# Patient Record
Sex: Female | Born: 1970 | Race: White | Hispanic: Yes | Marital: Married | Smoking: Never smoker
Health system: Southern US, Community
[De-identification: ages and names within clinical notes are randomized; demographics above are authoritative.]

## PROBLEM LIST (undated history)

## (undated) DIAGNOSIS — R0600 Dyspnea, unspecified: Secondary | ICD-10-CM

## (undated) DIAGNOSIS — R87629 Unspecified abnormal cytological findings in specimens from vagina: Secondary | ICD-10-CM

## (undated) DIAGNOSIS — R51 Headache: Secondary | ICD-10-CM

## (undated) DIAGNOSIS — Z8619 Personal history of other infectious and parasitic diseases: Secondary | ICD-10-CM

## (undated) DIAGNOSIS — R002 Palpitations: Secondary | ICD-10-CM

## (undated) DIAGNOSIS — T8859XA Other complications of anesthesia, initial encounter: Secondary | ICD-10-CM

## (undated) DIAGNOSIS — R079 Chest pain, unspecified: Secondary | ICD-10-CM

## (undated) DIAGNOSIS — R519 Headache, unspecified: Secondary | ICD-10-CM

## (undated) DIAGNOSIS — Z8669 Personal history of other diseases of the nervous system and sense organs: Secondary | ICD-10-CM

## (undated) DIAGNOSIS — E785 Hyperlipidemia, unspecified: Secondary | ICD-10-CM

## (undated) HISTORY — DX: Headache: R51

## (undated) HISTORY — PX: BREAST ENHANCEMENT SURGERY: SHX7

## (undated) HISTORY — PX: AUGMENTATION MAMMAPLASTY: SUR837

## (undated) HISTORY — DX: Chest pain, unspecified: R07.9

## (undated) HISTORY — DX: Headache, unspecified: R51.9

## (undated) HISTORY — DX: Personal history of other diseases of the nervous system and sense organs: Z86.69

## (undated) HISTORY — DX: Personal history of other infectious and parasitic diseases: Z86.19

## (undated) HISTORY — DX: Hyperlipidemia, unspecified: E78.5

## (undated) HISTORY — DX: Palpitations: R00.2

## (undated) HISTORY — DX: Unspecified abnormal cytological findings in specimens from vagina: R87.629

---
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
## 2004-01-24 HISTORY — PX: AUGMENTATION MAMMAPLASTY: SUR837

## 2007-01-02 ENCOUNTER — Inpatient Hospital Stay (HOSPITAL_COMMUNITY): Admission: AD | Admit: 2007-01-02 | Discharge: 2007-01-02 | Payer: Self-pay | Admitting: Obstetrics and Gynecology

## 2007-07-06 DIAGNOSIS — O1410 Severe pre-eclampsia, unspecified trimester: Secondary | ICD-10-CM

## 2010-10-31 LAB — URINALYSIS, ROUTINE W REFLEX MICROSCOPIC
Bilirubin Urine: NEGATIVE
Glucose, UA: NEGATIVE
Nitrite: NEGATIVE
Specific Gravity, Urine: 1.015
pH: 7

## 2010-10-31 LAB — CBC
HCT: 37.5
MCHC: 34.2
MCV: 82.8
RBC: 4.53
WBC: 8.9

## 2010-10-31 LAB — URINE MICROSCOPIC-ADD ON

## 2010-10-31 LAB — URINE CULTURE

## 2011-06-21 ENCOUNTER — Ambulatory Visit (INDEPENDENT_AMBULATORY_CARE_PROVIDER_SITE_OTHER): Payer: Federal, State, Local not specified - PPO | Admitting: Family Medicine

## 2011-06-21 ENCOUNTER — Encounter: Payer: Self-pay | Admitting: Family Medicine

## 2011-06-21 VITALS — BP 108/60 | HR 84 | Temp 98.1°F | Ht 62.0 in | Wt 121.0 lb

## 2011-06-21 DIAGNOSIS — Z8669 Personal history of other diseases of the nervous system and sense organs: Secondary | ICD-10-CM | POA: Insufficient documentation

## 2011-06-21 DIAGNOSIS — F4320 Adjustment disorder, unspecified: Secondary | ICD-10-CM | POA: Insufficient documentation

## 2011-06-21 DIAGNOSIS — Z8619 Personal history of other infectious and parasitic diseases: Secondary | ICD-10-CM | POA: Insufficient documentation

## 2011-06-21 MED ORDER — ALPRAZOLAM 0.5 MG PO TBDP: 0.5000 mg | ORAL_TABLET | Freq: Two times a day (BID) | ORAL | Status: DC | PRN

## 2011-06-21 MED ORDER — ALPRAZOLAM 0.25 MG PO TBDP: 0.2500 mg | ORAL_TABLET | Freq: Two times a day (BID) | ORAL | Status: DC | PRN

## 2011-06-21 NOTE — Patient Instructions (Signed)
Gusto conocerla hoy.  llamenos con preguntas. Empieze xanax cuando necesite para ansiedad. regrese en 2-3 meses para recheck.

## 2011-06-21 NOTE — Assessment & Plan Note (Addendum)
GAD7 = 7/21 Possible anxiety attacks. Discussed healthy coping strategies for stress as well as anticipated improvement of anxiety and sxs with more stable environment. Will treat temporarily with xanax. Discussed if anxiety becoming more established, will likely recommend healthier long term management strategy (SSRI, CBT). Pt agrees with plan. rtc 2-3 mo for f/u.

## 2011-06-21 NOTE — Assessment & Plan Note (Signed)
sounds like Hep A after contaminated food, told cleared on her own

## 2011-06-21 NOTE — Progress Notes (Signed)
Subjective:    Patient ID: Krista Perez, female    DOB: 02/04/1970, 41 y.o.   MRN: [id number]  HPI CC: new pt establish  H/o migraines and HA, seem to be associated around period.  1-2 days after period.  Typically respond to otc analgesics  Anxiety - feeling increased anxiety recently.  Trouble sleeping recently.  Lived in [place] for 17 yrs where she met husband, then moved to [place] for 6 yrs for husband's job, then moved to [place] for 3 mo, now recently moved to [place].  Lots of stress with moves, fluctuating living environment.  Some anxiety attacks described as worsening anxiety associated with SOB.  Notes more irritable and shorter fuse with this.  Hopeful for more stable living situation planned in near future - no moves planned.  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  H/o some anxiety years ago in college, treated with alprazolam in [place] and [place] in [place].  Xanax did significantly help with anxiety issues.  ambien caused migraines.  Stress relieving techniques - goes to gym 3x/wk.  Does take time out for herself on a regular basis.  Started taking ceramic course in [place].  Used to work, not currently.  Did enjoy working in past, but thinks will start applying for job in a few months, first wants to settle into new environment.  Preventative: Currently on period Well woman 10/2010 - in [place].  Normal pap smear, mammogram. Normal blood work Tetanus - thinks 2011  Caffeine: rare decaf Lives with husband and daughter (2009) and 1 dog Occupation: currently housewife, thinking about going back to work.  Prior lived in [place], husband is [employer] agent Activity: gym 3x/wk Diet: cooks at home.  Good water, fruits/vegetables daily  Medications and allergies reviewed and updated in chart.  Past histories reviewed and updated if relevant as below. Patient Active Problem List  Diagnoses  . History of hepatitis  . Hx of migraines  .  Adjustment disorder   Past Medical History  Diagnosis Date  . Generalized headaches     frequent  . Hx of migraines   . History of hepatitis     sounds like Hep A after contaminated food, cleared   Past Surgical History  Procedure Date  . Appendectomy 1990  . Tonsillectomy 1980  . Breast enhancement surgery    History  Substance Use Topics  . Smoking status: Never Smoker   . Smokeless tobacco: Never Used  . Alcohol Use: Yes     Rare   Family History  Problem Relation Age of Onset  . Cancer Maternal Grandfather 50    lung  . Cancer Maternal Grandmother 70    leukemia  . Coronary artery disease Neg Hx   . Stroke Neg Hx   . Diabetes Neg Hx   . Hypertension Neg Hx    No Known Allergies No current outpatient prescriptions on file prior to visit.     Review of Systems  Constitutional: Negative for fever, chills, activity change, appetite change, fatigue and unexpected weight change.  HENT: Negative for hearing loss and neck pain.   Eyes: Positive for visual disturbance (some deteriorating vision).  Respiratory: Positive for shortness of breath. Negative for cough, chest tightness and wheezing.   Cardiovascular: Negative for chest pain, palpitations and leg swelling.  Gastrointestinal: Negative for nausea, vomiting, abdominal pain, diarrhea, constipation, blood in stool and abdominal distention.  Genitourinary: Negative for hematuria and difficulty urinating.  Musculoskeletal: Negative for myalgias  and arthralgias.  Skin: Negative for rash.  Neurological: Positive for headaches. Negative for dizziness, seizures and syncope.  Hematological: Does not bruise/bleed easily.  Psychiatric/Behavioral: Negative for dysphoric mood. The patient is nervous/anxious.        Objective:   Physical Exam  Nursing note and vitals reviewed. Constitutional: She is oriented to person, place, and time. She appears well-developed and well-nourished. No distress.  HENT:  Head:  Normocephalic and atraumatic.  Right Ear: Hearing, tympanic membrane, external ear and ear canal normal.  Left Ear: Hearing, tympanic membrane, external ear and ear canal normal.  Nose: Nose normal. No mucosal edema or rhinorrhea.  Mouth/Throat: Uvula is midline, oropharynx is clear and moist and mucous membranes are normal. No oropharyngeal exudate, posterior oropharyngeal edema, posterior oropharyngeal erythema or tonsillar abscesses.  Eyes: Conjunctivae and EOM are normal. Pupils are equal, round, and reactive to light. No scleral icterus.  Neck: Normal range of motion. Neck supple. No thyromegaly present.  Cardiovascular: Normal rate, regular rhythm, normal heart sounds and intact distal pulses.   No murmur heard. Pulses:      Radial pulses are 2+ on the right side, and 2+ on the left side.  Pulmonary/Chest: Effort normal and breath sounds normal. No respiratory distress. She has no wheezes. She has no rales.  Abdominal: Soft. Bowel sounds are normal. She exhibits no distension and no mass. There is no tenderness. There is no rebound and no guarding.  Musculoskeletal: Normal range of motion. She exhibits no edema.  Lymphadenopathy:    She has no cervical adenopathy.  Neurological: She is alert and oriented to person, place, and time.       CN grossly intact, station and gait intact  Skin: Skin is warm and dry. No rash noted.  Psychiatric: She has a normal mood and affect. Her behavior is normal. Judgment and thought content normal.       Assessment & Plan:

## 2011-07-19 ENCOUNTER — Other Ambulatory Visit: Payer: Self-pay

## 2011-07-19 MED ORDER — ALPRAZOLAM 0.5 MG PO TBDP: 0.5000 mg | ORAL_TABLET | Freq: Two times a day (BID) | ORAL | Status: AC | PRN

## 2011-07-19 NOTE — Telephone Encounter (Signed)
After seen 06/21/11 took Alprazolam 0.25 mg one tab twice a day prn for 1 week; did not help anxiety. After first week pt taking two tabs @ hs and if needed for anxiousness 2 tabs in AM. This helped anxiety. Out of [facility]; Please advise.

## 2011-07-19 NOTE — Telephone Encounter (Signed)
May phone in higher dose then notify pt.

## 2011-07-20 NOTE — Telephone Encounter (Signed)
Called in Rx as directed and called patient to let her know it was called to the pharmacy.

## 2011-08-01 ENCOUNTER — Ambulatory Visit: Payer: Self-pay | Admitting: Internal Medicine

## 2011-09-11 ENCOUNTER — Telehealth: Payer: Self-pay

## 2011-09-11 ENCOUNTER — Ambulatory Visit (INDEPENDENT_AMBULATORY_CARE_PROVIDER_SITE_OTHER): Payer: Federal, State, Local not specified - PPO | Admitting: Family Medicine

## 2011-09-11 ENCOUNTER — Encounter: Payer: Self-pay | Admitting: Family Medicine

## 2011-09-11 VITALS — BP 104/68 | HR 70 | Temp 98.3°F | Ht 62.0 in | Wt 117.0 lb

## 2011-09-11 DIAGNOSIS — R51 Headache: Secondary | ICD-10-CM

## 2011-09-11 DIAGNOSIS — R519 Headache, unspecified: Secondary | ICD-10-CM | POA: Insufficient documentation

## 2011-09-11 MED ORDER — CYCLOBENZAPRINE HCL 10 MG PO TABS: 10.0000 mg | ORAL_TABLET | Freq: Two times a day (BID) | ORAL | Status: DC | PRN

## 2011-09-11 MED ORDER — CYCLOBENZAPRINE HCL 10 MG PO TABS: 10.0000 mg | ORAL_TABLET | Freq: Three times a day (TID) | ORAL | Status: DC | PRN

## 2011-09-11 NOTE — Telephone Encounter (Signed)
Casie with [facility] wanted to verify cyclobenzaprine instructions take one tab by mouth two times daily as needed for h/a.Verified by chart note.

## 2011-09-11 NOTE — Assessment & Plan Note (Signed)
Of 3 d duration, in h/o migraines. Anticipate tension type headache, or possibly migraine without aura. Doubt occipital neuralgia. Nonfocal neurological exam. Discussed different headache types, wil treat with flexeril PRN, discussed sedation precautions. Also suggested massage/heat. If not improved, consider further evaluation.
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
## 2011-09-11 NOTE — Patient Instructions (Signed)
Creo que tiene dolor de cabeza muscular (tension type headache). Use heating pad, flexeril (relajante de musculo), y considere masaje. Gusto verla hoy.  dejenos saber si no esta mejorando con esto. llame a clinica con preguntas.

## 2011-09-21 ENCOUNTER — Ambulatory Visit: Payer: Federal, State, Local not specified - PPO | Admitting: Family Medicine

## 2011-11-28 ENCOUNTER — Telehealth: Payer: Self-pay | Admitting: Family Medicine

## 2011-11-28 NOTE — Telephone Encounter (Signed)
Patient notified and appt scheduled for tomorrow. She will call and cancel if she decides she doesn't need appt in the morning.

## 2011-11-28 NOTE — Telephone Encounter (Signed)
Noted. I could see her today at 3:15pm or schedule appt tomorrow.

## 2011-11-28 NOTE — Telephone Encounter (Signed)
Caller: [name]/Patient; Patient Name: Krista Perez; PCP: Eustaquio Boyden [facility]); Best Callback Phone Number: [phone] Calling regarding sore or a blister like lesion to her palate, noticed 11/25/11  its the size of a pinky finger print. States this hurts. Was flossing and hit top of her mouth. Hurts worse when she eats. Took Tylenol 11/27/11 and pain was relieved and was able to sleep well. Emergent signs and symptoms ruled out as per Mouth Lesions protocol except for see in 24 hours due to painful white spot on the lining of the mouth, will call back for appt.

## 2011-11-29 ENCOUNTER — Ambulatory Visit: Payer: Federal, State, Local not specified - PPO | Admitting: Family Medicine

## 2012-01-29 ENCOUNTER — Other Ambulatory Visit: Payer: Self-pay

## 2012-01-29 MED ORDER — ALPRAZOLAM 0.5 MG PO TABS: 0.5000 mg | ORAL_TABLET | Freq: Every evening | ORAL | Status: DC | PRN

## 2012-01-29 NOTE — Telephone Encounter (Addendum)
Pt left v/m requesting refill alprazolam to [facility]. Pt said having trouble relaxing so can go to sleep and Pt is going out of town on international flight in 2 weeks and that makes pt anxious when flies.pt request call back when rx called in.Please advise.

## 2012-01-29 NOTE — Telephone Encounter (Signed)
plz phone in. 

## 2012-01-30 NOTE — Telephone Encounter (Signed)
Rx called in as directed.   

## 2012-03-14 ENCOUNTER — Telehealth: Payer: Self-pay

## 2012-03-14 DIAGNOSIS — IMO0001 Reserved for inherently not codable concepts without codable children: Secondary | ICD-10-CM

## 2012-03-14 NOTE — Telephone Encounter (Signed)
Pt left v/m she is applying for new employment and needs lab test that pt is immune to MMR, varicella,hep B. Pt also needs to schedule influenza injection and TB test.Please advise.

## 2012-03-15 ENCOUNTER — Other Ambulatory Visit (INDEPENDENT_AMBULATORY_CARE_PROVIDER_SITE_OTHER): Payer: Federal, State, Local not specified - PPO

## 2012-03-15 ENCOUNTER — Telehealth: Payer: Self-pay | Admitting: Family Medicine

## 2012-03-15 DIAGNOSIS — Z0184 Encounter for antibody response examination: Secondary | ICD-10-CM

## 2012-03-15 DIAGNOSIS — IMO0001 Reserved for inherently not codable concepts without codable children: Secondary | ICD-10-CM

## 2012-03-15 NOTE — Telephone Encounter (Signed)
Message left advising patient to call and schedule nurse visit for Flu vaccine and PPD and lab appt for titers. Will await return call. Please see below and place orders for titers. Thanks!

## 2012-03-15 NOTE — Telephone Encounter (Signed)
See other phone note dated same.

## 2012-03-15 NOTE — Telephone Encounter (Signed)
placed orders in chart.  Pt will need PPD as well. Would also provide with Tdap unless certain she received 2011.

## 2012-03-15 NOTE — Telephone Encounter (Signed)
Pt needs PPD skin test for her employment. Is it ok to sch this? Thank you.

## 2012-03-18 LAB — RUBELLA SCREEN: Rubella: 24.7 Index — ABNORMAL HIGH (ref <0.90)

## 2012-03-18 LAB — MUMPS ANTIBODY, IGG: Mumps IgG: >300 AU/mL — ABNORMAL HIGH (ref <9.00)

## 2012-03-18 LAB — VARICELLA ZOSTER ANTIBODY, IGG: Varicella IgG: 3035 Index — ABNORMAL HIGH (ref <135.00)

## 2012-03-19 ENCOUNTER — Ambulatory Visit (INDEPENDENT_AMBULATORY_CARE_PROVIDER_SITE_OTHER): Payer: Federal, State, Local not specified - PPO | Admitting: *Deleted

## 2012-03-19 DIAGNOSIS — Z0279 Encounter for issue of other medical certificate: Secondary | ICD-10-CM

## 2012-03-19 DIAGNOSIS — Z23 Encounter for immunization: Secondary | ICD-10-CM

## 2012-03-21 LAB — TB SKIN TEST
Induration: 0 mm
TB Skin Test: NEGATIVE

## 2012-03-27 ENCOUNTER — Telehealth: Payer: Self-pay | Admitting: *Deleted

## 2012-03-27 NOTE — Telephone Encounter (Signed)
Please clarify with patient what the problem is - she's immune to MMR, and varicella.  She received 1st Hep B shot.  Should be fine for school.

## 2012-03-27 NOTE — Telephone Encounter (Signed)
Patient calling stating that she got her results for the MMR titer and she turned it in to her school but they are saying she's only immune to only one, not all of the MMR. Please advise

## 2012-03-28 NOTE — Telephone Encounter (Signed)
We drew for Mumps, Rubella, and Rubeola. We did not draw for Measles. She needs to show immunity for that as well.

## 2012-03-28 NOTE — Telephone Encounter (Signed)
Rubeola = measles.

## 2012-03-28 NOTE — Telephone Encounter (Signed)
Message left advising patient. Instructed to call with any questions or if further problems.

## 2012-04-01 ENCOUNTER — Encounter: Payer: Self-pay | Admitting: *Deleted

## 2012-04-03 NOTE — Telephone Encounter (Signed)
This has been taken of.

## 2012-08-20 ENCOUNTER — Other Ambulatory Visit (INDEPENDENT_AMBULATORY_CARE_PROVIDER_SITE_OTHER): Payer: Federal, State, Local not specified - PPO

## 2012-08-20 DIAGNOSIS — Z0000 Encounter for general adult medical examination without abnormal findings: Secondary | ICD-10-CM

## 2012-08-20 DIAGNOSIS — Z13220 Encounter for screening for lipoid disorders: Secondary | ICD-10-CM

## 2012-08-20 DIAGNOSIS — Z131 Encounter for screening for diabetes mellitus: Secondary | ICD-10-CM

## 2012-08-20 LAB — COMPREHENSIVE METABOLIC PANEL
AST: 14 U/L (ref 0–37)
Albumin: 3.9 g/dL (ref 3.5–5.2)
BUN: 10 mg/dL (ref 6–23)
CO2: 25 mEq/L (ref 19–32)
Calcium: 9 mg/dL (ref 8.4–10.5)
Chloride: 104 mEq/L (ref 96–112)
GFR: 136.58 mL/min (ref >60.00)
Glucose, Bld: 97 mg/dL (ref 70–99)
Potassium: 4 mEq/L (ref 3.5–5.1)

## 2012-08-20 LAB — LDL CHOLESTEROL, DIRECT: Direct LDL: 167.6 mg/dL

## 2012-08-20 LAB — CBC WITH DIFFERENTIAL/PLATELET
Eosinophils Relative: 2.5 % (ref 0.0–5.0)
Lymphocytes Relative: 25.6 % (ref 12.0–46.0)
MCV: 85.6 fl (ref 78.0–100.0)
Monocytes Absolute: 0.3 10*3/uL (ref 0.1–1.0)
Neutrophils Relative %: 66.3 % (ref 43.0–77.0)
Platelets: 243 10*3/uL (ref 150.0–400.0)
WBC: 5.1 10*3/uL (ref 4.5–10.5)

## 2012-08-20 LAB — TSH: TSH: 2 u[IU]/mL (ref 0.35–5.50)

## 2012-08-30 ENCOUNTER — Encounter: Payer: Self-pay | Admitting: Radiology

## 2012-09-02 ENCOUNTER — Encounter: Payer: Federal, State, Local not specified - PPO | Admitting: Family Medicine

## 2012-09-12 ENCOUNTER — Encounter: Payer: Federal, State, Local not specified - PPO | Admitting: Family Medicine

## 2012-10-01 ENCOUNTER — Encounter: Payer: Federal, State, Local not specified - PPO | Admitting: Family Medicine

## 2012-10-07 ENCOUNTER — Encounter: Payer: Self-pay | Admitting: Family Medicine

## 2012-10-07 ENCOUNTER — Ambulatory Visit (INDEPENDENT_AMBULATORY_CARE_PROVIDER_SITE_OTHER): Payer: Federal, State, Local not specified - PPO | Admitting: Family Medicine

## 2012-10-07 VITALS — BP 118/68 | HR 76 | Temp 98.2°F | Ht 62.0 in | Wt 120.5 lb

## 2012-10-07 DIAGNOSIS — Z0000 Encounter for general adult medical examination without abnormal findings: Secondary | ICD-10-CM

## 2012-10-07 DIAGNOSIS — Z23 Encounter for immunization: Secondary | ICD-10-CM

## 2012-10-07 DIAGNOSIS — Z1231 Encounter for screening mammogram for malignant neoplasm of breast: Secondary | ICD-10-CM

## 2012-10-07 DIAGNOSIS — E785 Hyperlipidemia, unspecified: Secondary | ICD-10-CM | POA: Insufficient documentation

## 2012-10-07 DIAGNOSIS — F4320 Adjustment disorder, unspecified: Secondary | ICD-10-CM

## 2012-10-07 DIAGNOSIS — Z01419 Encounter for gynecological examination (general) (routine) without abnormal findings: Secondary | ICD-10-CM

## 2012-10-07 MED ORDER — ALPRAZOLAM 0.5 MG PO TABS: 0.5000 mg | ORAL_TABLET | Freq: Every evening | ORAL | Status: DC | PRN

## 2012-10-07 NOTE — Addendum Note (Signed)
Addended by: Josph Macho A on: 10/07/2012 09:54 AM   Modules accepted: Orders

## 2012-10-07 NOTE — Assessment & Plan Note (Signed)
Mild. Reviewed healthy and low chol diet with patient.

## 2012-10-07 NOTE — Assessment & Plan Note (Signed)
Preventative protocols reviewed and updated unless pt declined. Discussed healthy diet and lifestyle. 2nd Hep B today, flu shot today. Will refer to OBGYN to establish for well woman exam.

## 2012-10-07 NOTE — Patient Instructions (Signed)
Second hepatitis B shot today. Flu shot today. Consider ibuprofen or midrin for headaches associated with periods. Pass by Marion's office for referral to GYN for well woman exam and for referral for mammogram. Good to see you today, call us with questions.

## 2012-10-07 NOTE — Assessment & Plan Note (Signed)
Minimal as she's grown accustomed to [place].  Minimal use of xanax - refilled today per patient preference.  Last filled 01/2012.

## 2012-10-07 NOTE — Progress Notes (Signed)
Subjective:    Patient ID: Krista Perez, female    DOB: [birth year], 42 y.o.   MRN: [id number]  HPI CC: CPE  R sided HA - on and of.  Tend to be related to period - 4-5 days prior.  Ibuprofen 400mg  helps. Periods becoming more irregular. LMP - 2 d ago. Mood issues - xanax intermittently helps. Finds irritability with 72 yo daughter and husband. Exercising 2-3 times a week at gym. Some difficulty with getting used to [place] - and being away from parents.  Working as Paediatric nurse.  Preventative: Well woman 10/2010 - in [place]. Normal pap smear, mammogram. No h/o abnormal paps. Breast cancer screening - would like mammogram done at breast center.  Would like yearly screening. Would like to establish with local OBGYN for well woman exam. Tetanus - thinks 2011  Flu shot today. 2nd Hep B today.  Seat belt use discussed. No sunburns in last year.  Sunscreen use discussed.  Caffeine: rare decaf  Lives with husband and daughter (2009) and 1 dog  Occupation: currently housewife, thinking about going back to work. Prior lived in [place], husband is [employer] agent  Activity: gym 3x/wk  Diet: cooks at home. Good water, fruits/vegetables daily  Medications and allergies reviewed and updated in chart.  Past histories reviewed and updated if relevant as below. Patient Active Problem List   Diagnosis Date Noted  . Right-sided headache 09/11/2011  . Adjustment disorder 06/21/2011  . History of hepatitis   . Hx of migraines    Past Medical History  Diagnosis Date  . Generalized headaches     frequent  . Hx of migraines   . History of hepatitis     sounds like Hep A after contaminated food, cleared   Past Surgical History  Procedure Laterality Date  . Appendectomy  1990  . Tonsillectomy  1980  . Breast enhancement surgery     History  Substance Use Topics  . Smoking status: Never Smoker   . Smokeless tobacco: Never Used  . Alcohol Use: Yes     Comment: Rare    Family History  Problem Relation Age of Onset  . Cancer Maternal Grandfather 50    lung  . Cancer Maternal Grandmother 70    leukemia  . Coronary artery disease Neg Hx   . Stroke Neg Hx   . Diabetes Neg Hx   . Hypertension Neg Hx    No Known Allergies Current Outpatient Prescriptions on File Prior to Visit  Medication Sig Dispense Refill  . ALPRAZolam (XANAX) 0.5 MG tablet Take 1 tablet (0.5 mg total) by mouth at bedtime as needed for sleep or anxiety.  30 tablet  0   No current facility-administered medications on file prior to visit.     Review of Systems  Constitutional: Negative for fever, chills, activity change, appetite change, fatigue and unexpected weight change.  HENT: Negative for hearing loss and neck pain.   Eyes: Negative for visual disturbance.  Respiratory: Negative for cough, chest tightness, shortness of breath and wheezing.   Cardiovascular: Negative for chest pain, palpitations and leg swelling.  Gastrointestinal: Negative for nausea, vomiting, abdominal pain, diarrhea, constipation, blood in stool and abdominal distention.  Genitourinary: Negative for hematuria and difficulty urinating.  Musculoskeletal: Negative for myalgias and arthralgias.  Skin: Negative for rash.  Neurological: Positive for headaches. Negative for dizziness, seizures and syncope.  Hematological: Negative for adenopathy. Does not bruise/bleed easily.  Psychiatric/Behavioral: Negative for dysphoric mood. The patient is  not nervous/anxious.        Objective:   Physical Exam  Nursing note and vitals reviewed. Constitutional: She is oriented to person, place, and time. She appears well-developed and well-nourished. No distress.  HENT:  Head: Normocephalic and atraumatic.  Right Ear: Hearing, tympanic membrane, external ear and ear canal normal.  Left Ear: Hearing, tympanic membrane, external ear and ear canal normal.  Nose: Nose normal.  Mouth/Throat: Oropharynx is clear and moist.  No oropharyngeal exudate.  Eyes: Conjunctivae and EOM are normal. Pupils are equal, round, and reactive to light. No scleral icterus.  Neck: Normal range of motion. Neck supple. No thyromegaly present.  Cardiovascular: Normal rate, regular rhythm, normal heart sounds and intact distal pulses.   No murmur heard. Pulses:      Radial pulses are 2+ on the right side, and 2+ on the left side.  Pulmonary/Chest: Effort normal and breath sounds normal. No respiratory distress. She has no wheezes. She has no rales.  Abdominal: Soft. Bowel sounds are normal. She exhibits no distension and no mass. There is no tenderness. There is no rebound and no guarding.  Musculoskeletal: Normal range of motion. She exhibits no edema.  Lymphadenopathy:    She has no cervical adenopathy.  Neurological: She is alert and oriented to person, place, and time.  CN grossly intact, station and gait intact  Skin: Skin is warm and dry. No rash noted.  Psychiatric: She has a normal mood and affect. Her behavior is normal. Judgment and thought content normal.       Assessment & Plan:

## 2012-10-17 ENCOUNTER — Telehealth: Payer: Self-pay

## 2012-10-17 NOTE — Telephone Encounter (Signed)
Pt request copy of immunization report and labs on 03/15/12 for titers and antibodies. Release of information process done. Release # [id number].pt notified copies at front desk ready for pick up.

## 2012-10-29 ENCOUNTER — Encounter: Payer: Federal, State, Local not specified - PPO | Admitting: Obstetrics & Gynecology

## 2012-10-30 ENCOUNTER — Ambulatory Visit
Admission: RE | Admit: 2012-10-30 | Discharge: 2012-10-30 | Disposition: A | Discharge status: Home / Self Care | Payer: Federal, State, Local not specified - PPO | Source: Ambulatory Visit | Attending: Family Medicine | Admitting: Family Medicine

## 2012-10-30 DIAGNOSIS — Z1231 Encounter for screening mammogram for malignant neoplasm of breast: Secondary | ICD-10-CM

## 2012-11-05 ENCOUNTER — Ambulatory Visit (INDEPENDENT_AMBULATORY_CARE_PROVIDER_SITE_OTHER): Payer: Federal, State, Local not specified - PPO | Admitting: Obstetrics & Gynecology

## 2012-11-05 ENCOUNTER — Encounter: Payer: Self-pay | Admitting: Obstetrics & Gynecology

## 2012-11-05 VITALS — BP 123/82 | HR 78 | Ht 62.0 in | Wt 122.0 lb

## 2012-11-05 DIAGNOSIS — R928 Other abnormal and inconclusive findings on diagnostic imaging of breast: Secondary | ICD-10-CM

## 2012-11-05 DIAGNOSIS — Z1151 Encounter for screening for human papillomavirus (HPV): Secondary | ICD-10-CM

## 2012-11-05 DIAGNOSIS — Z124 Encounter for screening for malignant neoplasm of cervix: Secondary | ICD-10-CM

## 2012-11-05 DIAGNOSIS — Z01419 Encounter for gynecological examination (general) (routine) without abnormal findings: Secondary | ICD-10-CM

## 2012-11-05 NOTE — Progress Notes (Signed)
  Subjective:     Krista Perez is a 42 y.o. female and is here for a comprehensive physical exam. The patient reports no problems.  History   Social History  . Marital Status: Married    Spouse Name: N/A    Number of Children: N/A  . Years of Education: N/A   Occupational History  . Not on file.   Social History Main Topics  . Smoking status: Never Smoker   . Smokeless tobacco: Never Used  . Alcohol Use: Yes     Comment: Rare  . Drug Use: No  . Sexual Activity: Yes    Partners: Male   Other Topics Concern  . Not on file   Social History Narrative   Caffeine: rare decaf   Lives with husband and daughter (2009) and 1 dog   Occupation: currently housewife, thinking about going back to work.  Prior lived in [place], husband is [employer] agent   Activity: gym 3x/wk   Diet: cooks at home.  Good water, fruits/vegetables daily   Health Maintenance  Topic Date Due  . Pap Smear  04/21/1987  . Influenza Vaccine  08/23/2013  . Tetanus/tdap  01/24/2019    The following portions of the patient's history were reviewed and updated as appropriate: allergies, current medications, past family history, past medical history, past social history, past surgical history and problem list.  10/30/12 screening mammogram showed possible left breast mass, right breast assymetry. Breast Center will arrange for further imaging and management.  This was discussed with the patient.  Review of Systems A comprehensive review of systems was negative.   Objective:  BP 123/82  Pulse 78  Ht 5\' 2"  (1.575 m)  Wt 122 lb (55.339 kg)  BMI 22.31 kg/m2  LMP 10/05/2012 GENERAL: Well-developed, well-nourished female in no acute distress.  HEENT: Normocephalic, atraumatic. Sclerae anicteric.  NECK: Supple. Normal thyroid.  LUNGS: Clear to auscultation bilaterally.  HEART: Regular rate and rhythm. BREASTS: Symmetric in size, implants in place. No palpable abnormal masses, skin changes, nipple drainage, or  lymphadenopathy. ABDOMEN: Soft, nontender, nondistended. No organomegaly. PELVIC: Normal external female genitalia. Vagina is pink and rugated.  Normal discharge. Normal cervix contour. Pap smear obtained. Uterus is normal in size. No adnexal mass or tenderness.  EXTREMITIES: No cyanosis, clubbing, or edema, 2+ distal pulses.   Assessment:    Healthy female exam. Abnormal mammogram   Plan:   Pap done, will follow up results and manage accordingly. Patient to follow up with Breast Center regarding further evaluation of abnormal screening mammogram Routine preventative health maintenance measures emphasized

## 2012-11-05 NOTE — Patient Instructions (Signed)
Preventive Care for Adults, Female A healthy lifestyle and preventive care can promote health and wellness. Preventive health guidelines for women include the following key practices.  A routine yearly physical is a good way to check with your caregiver about your health and preventive screening. It is a chance to share any concerns and updates on your health, and to receive a thorough exam.  Visit your dentist for a routine exam and preventive care every 6 months. Brush your teeth twice a day and floss once a day. Good oral hygiene prevents tooth decay and gum disease.  The frequency of eye exams is based on your age, health, family medical history, use of contact lenses, and other factors. Follow your caregiver's recommendations for frequency of eye exams.  Eat a healthy diet. Foods like vegetables, fruits, whole grains, low-fat dairy products, and lean protein foods contain the nutrients you need without too many calories. Decrease your intake of foods high in solid fats, added sugars, and salt. Eat the right amount of calories for you.Get information about a proper diet from your caregiver, if necessary.  Regular physical exercise is one of the most important things you can do for your health. Most adults should get at least 150 minutes of moderate-intensity exercise (any activity that increases your heart rate and causes you to sweat) each week. In addition, most adults need muscle-strengthening exercises on 2 or more days a week.  Maintain a healthy weight. The body mass index (BMI) is a screening tool to identify possible weight problems. It provides an estimate of body fat based on height and weight. Your caregiver can help determine your BMI, and can help you achieve or maintain a healthy weight.For adults 20 years and older:  A BMI below 18.5 is considered underweight.  A BMI of 18.5 to 24.9 is normal.  A BMI of 25 to 29.9 is considered overweight.  A BMI of 30 and above is  considered obese.  Maintain normal blood lipids and cholesterol levels by exercising and minimizing your intake of saturated fat. Eat a balanced diet with plenty of fruit and vegetables. Blood tests for lipids and cholesterol should begin at age 20 and be repeated every 5 years. If your lipid or cholesterol levels are high, you are over 50, or you are at high risk for heart disease, you may need your cholesterol levels checked more frequently.Ongoing high lipid and cholesterol levels should be treated with medicines if diet and exercise are not effective.  If you smoke, find out from your caregiver how to quit. If you do not use tobacco, do not start.  If you are pregnant, do not drink alcohol. If you are breastfeeding, be very cautious about drinking alcohol. If you are not pregnant and choose to drink alcohol, do not exceed 1 drink per day. One drink is considered to be 12 ounces (355 mL) of beer, 5 ounces (148 mL) of wine, or 1.5 ounces (44 mL) of liquor.  Avoid use of street drugs. Do not share needles with anyone. Ask for help if you need support or instructions about stopping the use of drugs.  High blood pressure causes heart disease and increases the risk of stroke. Your blood pressure should be checked at least every 1 to 2 years. Ongoing high blood pressure should be treated with medicines if weight loss and exercise are not effective.  If you are 55 to 43 years old, ask your caregiver if you should take aspirin to prevent strokes.  Diabetes   screening involves taking a blood sample to check your fasting blood sugar level. This should be done once every 3 years, after age 45, if you are within normal weight and without risk factors for diabetes. Testing should be considered at a younger age or be carried out more frequently if you are overweight and have at least 1 risk factor for diabetes.  Breast cancer screening is essential preventive care for women. You should practice "breast  self-awareness." This means understanding the normal appearance and feel of your breasts and may include breast self-examination. Any changes detected, no matter how small, should be reported to a caregiver. Women in their 20s and 30s should have a clinical breast exam (CBE) by a caregiver as part of a regular health exam every 1 to 3 years. After age 40, women should have a CBE every year. Starting at age 40, women should consider having a mammography (breast X-ray test) every year. Women who have a family history of breast cancer should talk to their caregiver about genetic screening. Women at a high risk of breast cancer should talk to their caregivers about having magnetic resonance imaging (MRI) and a mammography every year.  The Pap test is a screening test for cervical cancer. A Pap test can show cell changes on the cervix that might become cervical cancer if left untreated. A Pap test is a procedure in which cells are obtained and examined from the lower end of the uterus (cervix).  Women should have a Pap test starting at age 21.  Between ages 21 and 29, Pap tests should be repeated every 2 years.  Beginning at age 30, you should have a Pap test every 3 years as long as the past 3 Pap tests have been normal.  Some women have medical problems that increase the chance of getting cervical cancer. Talk to your caregiver about these problems. It is especially important to talk to your caregiver if a new problem develops soon after your last Pap test. In these cases, your caregiver may recommend more frequent screening and Pap tests.  The above recommendations are the same for women who have or have not gotten the vaccine for human papillomavirus (HPV).  If you had a hysterectomy for a problem that was not cancer or a condition that could lead to cancer, then you no longer need Pap tests. Even if you no longer need a Pap test, a regular exam is a good idea to make sure no other problems are  starting.  If you are between ages 65 and 70, and you have had normal Pap tests going back 10 years, you no longer need Pap tests. Even if you no longer need a Pap test, a regular exam is a good idea to make sure no other problems are starting.  If you have had past treatment for cervical cancer or a condition that could lead to cancer, you need Pap tests and screening for cancer for at least 20 years after your treatment.  If Pap tests have been discontinued, risk factors (such as a new sexual partner) need to be reassessed to determine if screening should be resumed.  The HPV test is an additional test that may be used for cervical cancer screening. The HPV test looks for the virus that can cause the cell changes on the cervix. The cells collected during the Pap test can be tested for HPV. The HPV test could be used to screen women aged 30 years and older, and should   be used in women of any age who have unclear Pap test results. After the age of 30, women should have HPV testing at the same frequency as a Pap test.  Colorectal cancer can be detected and often prevented. Most routine colorectal cancer screening begins at the age of 50 and continues through age 75. However, your caregiver may recommend screening at an earlier age if you have risk factors for colon cancer. On a yearly basis, your caregiver may provide home test kits to check for hidden blood in the stool. Use of a small camera at the end of a tube, to directly examine the colon (sigmoidoscopy or colonoscopy), can detect the earliest forms of colorectal cancer. Talk to your caregiver about this at age 50, when routine screening begins. Direct examination of the colon should be repeated every 5 to 10 years through age 75, unless early forms of pre-cancerous polyps or small growths are found.  Hepatitis C blood testing is recommended for all people born from 1945 through 1965 and any individual with known risks for hepatitis C.  Practice  safe sex. Use condoms and avoid high-risk sexual practices to reduce the spread of sexually transmitted infections (STIs). STIs include gonorrhea, chlamydia, syphilis, trichomonas, herpes, HPV, and human immunodeficiency virus (HIV). Herpes, HIV, and HPV are viral illnesses that have no cure. They can result in disability, cancer, and death. Sexually active women aged 25 and younger should be checked for chlamydia. Older women with new or multiple partners should also be tested for chlamydia. Testing for other STIs is recommended if you are sexually active and at increased risk.  Osteoporosis is a disease in which the bones lose minerals and strength with aging. This can result in serious bone fractures. The risk of osteoporosis can be identified using a bone density scan. Women ages 65 and over and women at risk for fractures or osteoporosis should discuss screening with their caregivers. Ask your caregiver whether you should take a calcium supplement or vitamin D to reduce the rate of osteoporosis.  Menopause can be associated with physical symptoms and risks. Hormone replacement therapy is available to decrease symptoms and risks. You should talk to your caregiver about whether hormone replacement therapy is right for you.  Use sunscreen with sun protection factor (SPF) of 30 or more. Apply sunscreen liberally and repeatedly throughout the day. You should seek shade when your shadow is shorter than you. Protect yourself by wearing long sleeves, pants, a wide-brimmed hat, and sunglasses year round, whenever you are outdoors.  Once a month, do a whole body skin exam, using a mirror to look at the skin on your back. Notify your caregiver of new moles, moles that have irregular borders, moles that are larger than a pencil eraser, or moles that have changed in shape or color.  Stay current with required immunizations.  Influenza. You need a dose every fall (or winter). The composition of the flu vaccine  changes each year, so being vaccinated once is not enough.  Pneumococcal polysaccharide. You need 1 to 2 doses if you smoke cigarettes or if you have certain chronic medical conditions. You need 1 dose at age 65 (or older) if you have never been vaccinated.  Tetanus, diphtheria, pertussis (Tdap, Td). Get 1 dose of Tdap vaccine if you are younger than age 65, are over 65 and have contact with an infant, are a healthcare worker, are pregnant, or simply want to be protected from whooping cough. After that, you need a Td   booster dose every 10 years. Consult your caregiver if you have not had at least 3 tetanus and diphtheria-containing shots sometime in your life or have a deep or dirty wound.  HPV. You need this vaccine if you are a woman age 26 or younger. The vaccine is given in 3 doses over 6 months.  Measles, mumps, rubella (MMR). You need at least 1 dose of MMR if you were born in 1957 or later. You may also need a second dose.  Meningococcal. If you are age 19 to 21 and a first-year college student living in a residence hall, or have one of several medical conditions, you need to get vaccinated against meningococcal disease. You may also need additional booster doses.  Zoster (shingles). If you are age 60 or older, you should get this vaccine.  Varicella (chickenpox). If you have never had chickenpox or you were vaccinated but received only 1 dose, talk to your caregiver to find out if you need this vaccine.  Hepatitis A. You need this vaccine if you have a specific risk factor for hepatitis A virus infection or you simply wish to be protected from this disease. The vaccine is usually given as 2 doses, 6 to 18 months apart.  Hepatitis B. You need this vaccine if you have a specific risk factor for hepatitis B virus infection or you simply wish to be protected from this disease. The vaccine is given in 3 doses, usually over 6 months. Preventive Services / Frequency Ages 19 to 39  Blood  pressure check.** / Every 1 to 2 years.  Lipid and cholesterol check.** / Every 5 years beginning at age 20.  Clinical breast exam.** / Every 3 years for women in their 20s and 30s.  Pap test.** / Every 2 years from ages 21 through 29. Every 3 years starting at age 30 through age 65 or 70 with a history of 3 consecutive normal Pap tests.  HPV screening.** / Every 3 years from ages 30 through ages 65 to 70 with a history of 3 consecutive normal Pap tests.  Hepatitis C blood test.** / For any individual with known risks for hepatitis C.  Skin self-exam. / Monthly.  Influenza immunization.** / Every year.  Pneumococcal polysaccharide immunization.** / 1 to 2 doses if you smoke cigarettes or if you have certain chronic medical conditions.  Tetanus, diphtheria, pertussis (Tdap, Td) immunization. / A one-time dose of Tdap vaccine. After that, you need a Td booster dose every 10 years.  HPV immunization. / 3 doses over 6 months, if you are 26 and younger.  Measles, mumps, rubella (MMR) immunization. / You need at least 1 dose of MMR if you were born in 1957 or later. You may also need a second dose.  Meningococcal immunization. / 1 dose if you are age 19 to 21 and a first-year college student living in a residence hall, or have one of several medical conditions, you need to get vaccinated against meningococcal disease. You may also need additional booster doses.  Varicella immunization.** / Consult your caregiver.  Hepatitis A immunization.** / Consult your caregiver. 2 doses, 6 to 18 months apart.  Hepatitis B immunization.** / Consult your caregiver. 3 doses usually over 6 months. Ages 40 to 64  Blood pressure check.** / Every 1 to 2 years.  Lipid and cholesterol check.** / Every 5 years beginning at age 20.  Clinical breast exam.** / Every year after age 40.  Mammogram.** / Every year beginning at age 40   and continuing for as long as you are in good health. Consult with your  caregiver.  Pap test.** / Every 3 years starting at age 30 through age 65 or 70 with a history of 3 consecutive normal Pap tests.  HPV screening.** / Every 3 years from ages 30 through ages 65 to 70 with a history of 3 consecutive normal Pap tests.  Fecal occult blood test (FOBT) of stool. / Every year beginning at age 50 and continuing until age 75. You may not need to do this test if you get a colonoscopy every 10 years.  Flexible sigmoidoscopy or colonoscopy.** / Every 5 years for a flexible sigmoidoscopy or every 10 years for a colonoscopy beginning at age 50 and continuing until age 75.  Hepatitis C blood test.** / For all people born from 1945 through 1965 and any individual with known risks for hepatitis C.  Skin self-exam. / Monthly.  Influenza immunization.** / Every year.  Pneumococcal polysaccharide immunization.** / 1 to 2 doses if you smoke cigarettes or if you have certain chronic medical conditions.  Tetanus, diphtheria, pertussis (Tdap, Td) immunization.** / A one-time dose of Tdap vaccine. After that, you need a Td booster dose every 10 years.  Measles, mumps, rubella (MMR) immunization. / You need at least 1 dose of MMR if you were born in 1957 or later. You may also need a second dose.  Varicella immunization.** / Consult your caregiver.  Meningococcal immunization.** / Consult your caregiver.  Hepatitis A immunization.** / Consult your caregiver. 2 doses, 6 to 18 months apart.  Hepatitis B immunization.** / Consult your caregiver. 3 doses, usually over 6 months. Ages 65 and over  Blood pressure check.** / Every 1 to 2 years.  Lipid and cholesterol check.** / Every 5 years beginning at age 20.  Clinical breast exam.** / Every year after age 40.  Mammogram.** / Every year beginning at age 40 and continuing for as long as you are in good health. Consult with your caregiver.  Pap test.** / Every 3 years starting at age 30 through age 65 or 70 with a 3  consecutive normal Pap tests. Testing can be stopped between 65 and 70 with 3 consecutive normal Pap tests and no abnormal Pap or HPV tests in the past 10 years.  HPV screening.** / Every 3 years from ages 30 through ages 65 or 70 with a history of 3 consecutive normal Pap tests. Testing can be stopped between 65 and 70 with 3 consecutive normal Pap tests and no abnormal Pap or HPV tests in the past 10 years.  Fecal occult blood test (FOBT) of stool. / Every year beginning at age 50 and continuing until age 75. You may not need to do this test if you get a colonoscopy every 10 years.  Flexible sigmoidoscopy or colonoscopy.** / Every 5 years for a flexible sigmoidoscopy or every 10 years for a colonoscopy beginning at age 50 and continuing until age 75.  Hepatitis C blood test.** / For all people born from 1945 through 1965 and any individual with known risks for hepatitis C.  Osteoporosis screening.** / A one-time screening for women ages 65 and over and women at risk for fractures or osteoporosis.  Skin self-exam. / Monthly.  Influenza immunization.** / Every year.  Pneumococcal polysaccharide immunization.** / 1 dose at age 65 (or older) if you have never been vaccinated.  Tetanus, diphtheria, pertussis (Tdap, Td) immunization. / A one-time dose of Tdap vaccine if you are over   65 and have contact with an infant, are a healthcare worker, or simply want to be protected from whooping cough. After that, you need a Td booster dose every 10 years.  Varicella immunization.** / Consult your caregiver.  Meningococcal immunization.** / Consult your caregiver.  Hepatitis A immunization.** / Consult your caregiver. 2 doses, 6 to 18 months apart.  Hepatitis B immunization.** / Check with your caregiver. 3 doses, usually over 6 months. ** Family history and personal history of risk and conditions may change your caregiver's recommendations. Document Released: 03/07/2001 Document Revised: 04/03/2011  Document Reviewed: 06/06/2010 [employer] Patient Information 2014 [employer].  

## 2012-11-06 ENCOUNTER — Other Ambulatory Visit: Payer: Self-pay | Admitting: Family Medicine

## 2012-11-06 DIAGNOSIS — R928 Other abnormal and inconclusive findings on diagnostic imaging of breast: Secondary | ICD-10-CM

## 2012-11-08 ENCOUNTER — Ambulatory Visit
Admission: RE | Admit: 2012-11-08 | Discharge: 2012-11-08 | Disposition: A | Discharge status: Home / Self Care | Payer: Federal, State, Local not specified - PPO | Source: Ambulatory Visit | Attending: Family Medicine | Admitting: Family Medicine

## 2012-11-08 DIAGNOSIS — R928 Other abnormal and inconclusive findings on diagnostic imaging of breast: Secondary | ICD-10-CM

## 2012-11-08 LAB — HM MAMMOGRAPHY: HM Mammogram: NORMAL

## 2012-11-11 ENCOUNTER — Encounter: Payer: Self-pay | Admitting: *Deleted

## 2012-12-09 ENCOUNTER — Telehealth: Payer: Self-pay

## 2012-12-09 MED ORDER — FLUTICASONE PROPIONATE 50 MCG/ACT NA SUSP: 2.0000 | Freq: Every day | NASAL | Status: DC

## 2012-12-09 NOTE — Telephone Encounter (Signed)
Pt said for 3 days has had mild cold symptoms, runny nose and rt ear feels clogged; no earache but cannot hear as well out of that ear. No fever and no cough or wheezing. Pt said her 43 yo daughter is at home sick and cannot leave her. Pt wants med suggested.cannot schedule an appt until daughter gets better possibly end of week. [facility]pt request cb.

## 2012-12-09 NOTE — Telephone Encounter (Signed)
Pt left v/m for 3 days pt has had runny nose and clogged ear. Left v/m for pt to cb for more info.

## 2012-12-09 NOTE — Telephone Encounter (Signed)
Patient notified. She will come in next week if no better.

## 2012-12-09 NOTE — Telephone Encounter (Signed)
May try flonase intranasal steroid - sent into pharmacy. Watch for nosebleeds on this med. If not better with this, to come in for eval.

## 2012-12-17 ENCOUNTER — Ambulatory Visit (INDEPENDENT_AMBULATORY_CARE_PROVIDER_SITE_OTHER): Payer: Federal, State, Local not specified - PPO | Admitting: Family Medicine

## 2012-12-17 ENCOUNTER — Encounter: Payer: Self-pay | Admitting: Family Medicine

## 2012-12-17 VITALS — BP 108/68 | HR 88 | Temp 98.8°F | Wt 122.2 lb

## 2012-12-17 DIAGNOSIS — H6590 Unspecified nonsuppurative otitis media, unspecified ear: Secondary | ICD-10-CM

## 2012-12-17 DIAGNOSIS — N912 Amenorrhea, unspecified: Secondary | ICD-10-CM

## 2012-12-17 DIAGNOSIS — H6591 Unspecified nonsuppurative otitis media, right ear: Secondary | ICD-10-CM | POA: Insufficient documentation

## 2012-12-17 DIAGNOSIS — Z780 Asymptomatic menopausal state: Secondary | ICD-10-CM | POA: Insufficient documentation

## 2012-12-17 MED ORDER — ALPRAZOLAM 0.5 MG PO TABS: 0.5000 mg | ORAL_TABLET | Freq: Every evening | ORAL | Status: DC | PRN

## 2012-12-17 NOTE — Patient Instructions (Addendum)
Tome advil sinus (con decongestant) por 1 semana (2 veces al dia con comida). Siga flonase si quiere. Mucho liquido. Si no mejora en 1-2 semanas, dejeme saber.  Recomendamos calcio 1200mg  al dia (cada vaso de leche o jugo fortificado con calcio tiene 300mg ).  recomendamos 800 unidades de vitamina D al dia.  Otitis media serosa  (Serous Otitis Media ) La otitis media serosa se produce cuando se acumula lquido en el espacio del odo medio. Este espacio contiene los [employer] intervienen en la audicin y [place]. El aire en el espacio del odo medio ayuda a transmitir los sonidos.  El aire llega a ese lugar a travs de las trompas de [place]. Este conducto va desde la parte posterior de la nariz (nasofaringe) hasta el espacio del [place]. Mantiene la misma presin en el odo medio que la que hay en el mundo exterior. Tambin ayuda a Forensic psychologist lquido del espacio del odo [place]. CAUSAS  La otitis media serosa se produce cuando las trompas de [place] se obstruyen. Las causas de la obstruccin pueden ser:  [employer] odos.  Resfriados y otras infecciones de las vas respiratorias superiores.  Alergias.  Irritantes como el humo de Social research officer, government.  Cambios sbitos en la presin del aire (como cuando baja el avin).  Hipertrofia de adenoides.  Un bulto en la nasofaringe. Durante los resfros y las infecciones de las vas respiratorias, el espacio del odo medio puede temporariamente llenarse de lquido. Esto puede ocurrir despus de una infeccin en el odo tambin. Una vez que la infeccin se mejora, el lquido generalmente drenar fuera del odo a travs de las trompas de [place]. Si esto no ocurre, se produce la otitis media serosa. SIGNOS Y SNTOMAS   Prdida auditiva.  Sensacin de llenado en el odo, sin dolor.  Los nios pequeos pueden no manifestar sntomas, pero pueden mostrar ligeros cambios en la conducta, como estar agitados, tironearse de las orejas o  Automotive engineer. DIAGNSTICO  La otitis media serosa se diagnostica por medio de un examen de los odos. Podrn indicarle estudios para verificar el movimiento del tmpano. Tambin podrn [employer]. TRATAMIENTO  El lquido con frecuencia desaparece sin tratamiento. Si la causa es una [place], un tratamiento para mejorar la alergia puede ser de [place]. El lquido que persiste durante varios meses puede requerir una Advertising account executive. Colocar un tubo pequeo en el tmpano para:  Drenar el lquido.  Restablecer el aire en el espacio del odo medio. En ciertas situaciones, podrn indicarle antibiticos para evitar la ciruga. La ciruga puede realizarse para extirpar las adenoides agrandadas (si esta es la causa). INSTRUCCIONES PARA EL CUIDADO EN EL HOGAR   Mantenga a los nios alejados del humo del tabaco.  Asegrese de cumplir con todos los controles. SOLICITE ATENCIN MDICA SI:   La audicin no mejora en 3 meses.  La audicin empeora.  Siente dolor de odos.  Tiene un drenaje por el odo.  Tiene mareos.  Tiene otitis media slo en un odo o sangra por la nariz (epistaxis)  Advierte un bulto en el cuello. ASEGRESE DE QUE:  Comprende estas instrucciones.  Controlar su afeccin.  Recibir ayuda de inmediato si no mejora o si empeora. Document Released: 12/23/2007 Document Revised: 09/11/2012 [facility] Patient Information 2014 [place], [place].

## 2012-12-17 NOTE — Progress Notes (Signed)
  Subjective:    Patient ID: Krista Perez, female    DOB: [birth year], 42 y.o.   MRN: [id number]  HPI CC: check R ear   For last 1+ week, noticing mild cold sxs such as rhinorrhea, and feels right ear clogged.  Treated with flonase x1 wk with minimal relief.  Did have viral URI 10 d ago - along with daughter.  R ear clogged since she blew nose hard. No earache.  No fever and no cough.  No ST. Also would like to be evaluated for amenorrhea.  No period for the last 3 months.  Last 1.5 years had not been regular - occasional missed periods. No chances of pregnancy.  No sex over last few months.  Past Medical History  Diagnosis Date  . Generalized headaches     frequent  . Hx of migraines   . History of hepatitis     sounds like Hep A after contaminated food, cleared    Review of Systems Per HPI    Objective:   Physical Exam  Nursing note and vitals reviewed. Constitutional: She appears well-developed and well-nourished. No distress.  HENT:  Head: Normocephalic and atraumatic.  Right Ear: Tympanic membrane, external ear and ear canal normal.  Left Ear: Hearing, tympanic membrane, external ear and ear canal normal.  Nose: No mucosal edema or rhinorrhea. Right sinus exhibits no maxillary sinus tenderness and no frontal sinus tenderness. Left sinus exhibits no maxillary sinus tenderness and no frontal sinus tenderness.  Mouth/Throat: Uvula is midline, oropharynx is clear and moist and mucous membranes are normal. No oropharyngeal exudate, posterior oropharyngeal edema, posterior oropharyngeal erythema or tonsillar abscesses.  R TM congested, fluid behind TM Erythema of oropharynx  Eyes: Conjunctivae and EOM are normal. Pupils are equal, round, and reactive to light. No scleral icterus.  Neck: Normal range of motion. Neck supple.  Lymphadenopathy:    She has no cervical adenopathy.  Skin: Skin is warm and dry. No rash noted.       Assessment & Plan:

## 2012-12-17 NOTE — Assessment & Plan Note (Signed)
Post infectious serous otitis - discussed dx and treatment. rec NSAID/decongestant limited use, continued flonase, and rest. Update if sxs not improving in 1-2 wks to consider abx course and discussed option of ENT referral if persistent sxs.

## 2012-12-17 NOTE — Assessment & Plan Note (Signed)
Declines Upreg today - states no chances of pregnancy because she has not been recently sexually active. Describes 1.5 yr hx perimenopausal sxs. Discussed hot flash etiology, pt declines treatment options. Discussed possible vaginal dryness. Discussed importance of sufficient daily cal/vit D intake.

## 2012-12-17 NOTE — Progress Notes (Signed)
Pre-visit discussion using our clinic review tool. No additional management support is needed unless otherwise documented below in the visit note.  

## 2013-03-29 ENCOUNTER — Ambulatory Visit (INDEPENDENT_AMBULATORY_CARE_PROVIDER_SITE_OTHER): Payer: Federal, State, Local not specified - PPO | Admitting: Family Medicine

## 2013-03-29 ENCOUNTER — Encounter: Payer: Self-pay | Admitting: Family Medicine

## 2013-03-29 VITALS — BP 110/70 | HR 88 | Temp 98.8°F | Ht 62.0 in | Wt 123.2 lb

## 2013-03-29 DIAGNOSIS — J029 Acute pharyngitis, unspecified: Secondary | ICD-10-CM

## 2013-03-29 DIAGNOSIS — H109 Unspecified conjunctivitis: Secondary | ICD-10-CM

## 2013-03-29 DIAGNOSIS — J020 Streptococcal pharyngitis: Secondary | ICD-10-CM

## 2013-03-29 LAB — POCT RAPID STREP A (OFFICE): Rapid Strep A Screen: NEGATIVE

## 2013-03-29 MED ORDER — SULFAMETHOXAZOLE-TMP DS 800-160 MG PO TABS: 1.0000 | ORAL_TABLET | Freq: Two times a day (BID) | ORAL | Status: DC

## 2013-03-29 MED ORDER — POLYMYXIN B-TRIMETHOPRIM 10000-0.1 UNIT/ML-% OP SOLN: 1.0000 [drp] | Freq: Four times a day (QID) | OPHTHALMIC | Status: DC

## 2013-03-29 NOTE — Patient Instructions (Signed)
Probiotics daily, Digestive Advantage daily   Conjunctivitis Conjunctivitis is commonly called "pink eye." Conjunctivitis can be caused by bacterial or viral infection, allergies, or injuries. There is usually redness of the lining of the eye, itching, discomfort, and sometimes discharge. There may be deposits of matter along the eyelids. A viral infection usually causes a watery discharge, while a bacterial infection causes a yellowish, thick discharge. Pink eye is very contagious and spreads by direct contact. You may be given antibiotic eyedrops as part of your treatment. Before using your eye medicine, remove all drainage from the eye by washing gently with warm water and cotton balls. Continue to use the medication until you have awakened 2 mornings in a row without discharge from the eye. Do not rub your eye. This increases the irritation and helps spread infection. Use separate towels from other household members. Wash your hands with soap and water before and after touching your eyes. Use cold compresses to reduce pain and sunglasses to relieve irritation from light. Do not wear contact lenses or wear eye makeup until the infection is gone. SEEK MEDICAL CARE IF:   Your symptoms are not better after 3 days of treatment.  You have increased pain or trouble seeing.  The outer eyelids become very red or swollen. Document Released: 02/17/2004 Document Revised: 04/03/2011 Document Reviewed: 01/09/2005 [facility] Patient Information 2014 [place]. Pharyngitis Pharyngitis is redness, pain, and swelling (inflammation) of your pharynx.  CAUSES  Pharyngitis is usually caused by infection. Most of the time, these infections are from viruses (viral) and are part of a cold. However, sometimes pharyngitis is caused by bacteria (bacterial). Pharyngitis can also be caused by allergies. Viral pharyngitis may be spread from person to person by coughing, sneezing, and personal items or utensils (cups,  forks, spoons, toothbrushes). Bacterial pharyngitis may be spread from person to person by more intimate contact, such as kissing.  SIGNS AND SYMPTOMS  Symptoms of pharyngitis include:   Sore throat.   Tiredness (fatigue).   Low-grade fever.   Headache.  Joint pain and muscle aches.  Skin rashes.  Swollen lymph nodes.  Plaque-like film on throat or tonsils (often seen with bacterial pharyngitis). DIAGNOSIS  Your health care provider will ask you questions about your illness and your symptoms. Your medical history, along with a physical exam, is often all that is needed to diagnose pharyngitis. Sometimes, a rapid strep test is done. Other lab tests may also be done, depending on the suspected cause.  TREATMENT  Viral pharyngitis will usually get better in 3 4 days without the use of medicine. Bacterial pharyngitis is treated with medicines that kill germs (antibiotics).  HOME CARE INSTRUCTIONS   Drink enough water and fluids to keep your urine clear or pale yellow.   Only take over-the-counter or prescription medicines as directed by your health care provider:   If you are prescribed antibiotics, make sure you finish them even if you start to feel better.   Do not take aspirin.   Get lots of rest.   Gargle with 8 oz of salt water ( tsp of salt per 1 qt of water) as often as every 1 2 hours to soothe your throat.   Throat lozenges (if you are not at risk for choking) or sprays may be used to soothe your throat. SEEK MEDICAL CARE IF:   You have large, tender lumps in your neck.  You have a rash.  You cough up green, yellow-brown, or bloody spit. SEEK IMMEDIATE MEDICAL CARE  IF:   Your neck becomes stiff.  You drool or are unable to swallow liquids.  You vomit or are unable to keep medicines or liquids down.  You have severe pain that does not go away with the use of recommended medicines.  You have trouble breathing (not caused by a stuffy nose). MAKE  SURE YOU:   Understand these instructions.  Will watch your condition.  Will get help right away if you are not doing well or get worse. Document Released: 01/09/2005 Document Revised: 10/30/2012 Document Reviewed: 09/16/2012 [facility] Patient Information 2014 [place], [place].

## 2013-03-29 NOTE — Assessment & Plan Note (Signed)
Negative strep culture but her daughter is noted to have strep. Will start Bactrim bid and encouraged increased rest and hydration

## 2013-03-29 NOTE — Progress Notes (Signed)
Pre-visit discussion using our clinic review tool. No additional management support is needed unless otherwise documented below in the visit note.  

## 2013-03-29 NOTE — Progress Notes (Signed)
Patient ID: Krista Perez, female   DOB: 06/09/1970, 43 y.o.   MRN: [id number] Amneet Cendejas [id number] February 15, 1970 03/29/2013      Progress Note-Follow Up  Subjective  Chief Complaint  Chief Complaint  Patient presents with  . Sore Throat    x 3 days. Throat red & swollen. Daughter tested positive for strep on Monday.  . Eye Problem    left eye itching, twitching, & draining x 2 days. Woke up this morning with eye matted.    HPI  Patient is a 43 year old female who is in today complaining of 3 days worth of worsening symptoms. She has a dad who was just diagnosed with strep throat. She has been struggling with a sore throat and a swollen throat. She's had malaise, myalgias and fatigue. She's a cough and head congestion productive of green phlegm as well as some mild shortness of breath. Has tried some Claritin with no great improvement. Over the last 24 hours is also noted the left eyes become red, swollen and uncomfortable. She's had some clear discharge as well. No photophobia or visual changes. No GI or GU concerns. No chest pain or palpitations.  Past Medical History  Diagnosis Date  . Generalized headaches     frequent  . Hx of migraines   . History of hepatitis     sounds like Hep A after contaminated food, cleared    Past Surgical History  Procedure Laterality Date  . Appendectomy  1990  . Tonsillectomy  1980  . Breast enhancement surgery      Family History  Problem Relation Age of Onset  . Cancer Maternal Grandfather 50    lung (smoker)  . Cancer Maternal Grandmother 70    leukemia  . Coronary artery disease Neg Hx   . Stroke Neg Hx   . Diabetes Neg Hx   . Hypertension Neg Hx     History   Social History  . Marital Status: Married    Spouse Name: N/A    Number of Children: N/A  . Years of Education: N/A   Occupational History  . Not on file.   Social History Main Topics  . Smoking status: Never Smoker   . Smokeless tobacco: Never Used  . Alcohol  Use: Yes     Comment: Rare  . Drug Use: No  . Sexual Activity: Yes    Partners: Male   Other Topics Concern  . Not on file   Social History Narrative   Caffeine: rare decaf   Lives with husband and daughter (2009) and 1 dog   Occupation: currently housewife, thinking about going back to work.  Prior lived in [place], husband is [employer] agent   Activity: gym 3x/wk   Diet: cooks at home.  Good water, fruits/vegetables daily    Current Outpatient Prescriptions on File Prior to Visit  Medication Sig Dispense Refill  . ALPRAZolam (XANAX) 0.5 MG tablet Take 1 tablet (0.5 mg total) by mouth at bedtime as needed for sleep or anxiety.  30 tablet  0   No current facility-administered medications on file prior to visit.    No Known Allergies  Review of Systems  Review of Systems  Constitutional: Negative for fever, chills and malaise/fatigue.  HENT: Positive for sore throat.   Eyes: Positive for pain, discharge and redness. Negative for blurred vision, double vision and photophobia.  Respiratory: Positive for sputum production and shortness of breath. Negative for wheezing.   Cardiovascular: Positive for palpitations. Negative for chest  pain and leg swelling.  Gastrointestinal: Positive for nausea. Negative for heartburn, vomiting, abdominal pain and diarrhea.  Genitourinary: Negative for dysuria.  Musculoskeletal: Negative for falls.  Skin: Negative for rash.  Neurological: Negative for loss of consciousness and headaches.  Endo/Heme/Allergies: Negative for polydipsia.  Psychiatric/Behavioral: Negative for depression and suicidal ideas. The patient is not nervous/anxious and does not have insomnia.     Objective  BP 110/70  Pulse 88  Temp(Src) 98.8 F (37.1 C) (Oral)  Ht 5\' 2"  (1.575 m)  Wt 123 lb 4 oz (55.906 kg)  BMI 22.54 kg/m2  SpO2 97%  Physical Exam  Physical Exam  Vitals reviewed. Constitutional: She is oriented to person, place, and time and well-developed,  well-nourished, and in no distress. No distress.  HENT:  Head: Normocephalic and atraumatic.  Oropharynx erythematous  Eyes: Conjunctivae and EOM are normal. Pupils are equal, round, and reactive to light. Right eye exhibits no discharge. Left eye exhibits discharge.  Left eye is erythematous, slight clear discharge. No photophobia  Neck: Neck supple. No thyromegaly present.  Cardiovascular: Normal rate, regular rhythm and normal heart sounds.   No murmur heard. Pulmonary/Chest: Breath sounds normal. No respiratory distress. She has no wheezes.  Abdominal: She exhibits no distension and no mass.  Musculoskeletal: She exhibits no edema.  Lymphadenopathy:    She has cervical adenopathy.  Neurological: She is alert and oriented to person, place, and time.  Skin: Skin is warm and dry. No rash noted. She is not diaphoretic.  Psychiatric: Memory, affect and judgment normal.    Lab Results  Component Value Date   TSH 2.00 08/20/2012   Lab Results  Component Value Date   WBC 5.1 08/20/2012   HGB 13.1 08/20/2012   HCT 39.2 08/20/2012   MCV 85.6 08/20/2012   PLT 243.0 08/20/2012   Lab Results  Component Value Date   CREATININE 0.5 08/20/2012   BUN 10 08/20/2012   NA 136 08/20/2012   K 4.0 08/20/2012   CL 104 08/20/2012   CO2 25 08/20/2012   Lab Results  Component Value Date   ALT 10 08/20/2012   AST 14 08/20/2012   ALKPHOS 44 08/20/2012   BILITOT 0.7 08/20/2012   Lab Results  Component Value Date   CHOL 227* 08/20/2012   Lab Results  Component Value Date   HDL 41.60 08/20/2012   No results found for this basename: [facility]   Lab Results  Component Value Date   TRIG 111.0 08/20/2012   Lab Results  Component Value Date   CHOLHDL 5 08/20/2012     Assessment & Plan  Conjunctivitis Warm compresses tid and antibiotic drops. Start a probiotic and seek care if worsens.   Acute pharyngitis Negative strep culture but her daughter is noted to have strep. Will start Bactrim bid and  encouraged increased rest and hydration

## 2013-03-29 NOTE — Assessment & Plan Note (Signed)
Warm compresses tid and antibiotic drops. Start a probiotic and seek care if worsens.

## 2013-04-10 ENCOUNTER — Telehealth: Payer: Self-pay | Admitting: Family Medicine

## 2013-04-10 MED ORDER — FLUTICASONE PROPIONATE 50 MCG/ACT NA SUSP: 2.0000 | Freq: Every day | NASAL | Status: DC | PRN

## 2013-04-10 NOTE — Telephone Encounter (Signed)
Patient Information:  Caller Name: Corrie DandyMary  Phone: [phone]  Patient: Krista Perez, [name]  Gender: Female  DOB: March 20, 1970  Age: 43 Years  PCP: Eustaquio BoydenGutierrez, [facility](Family Practice)  Pregnant: No  Office Follow Up:  Does the office need to follow up with this patient?: Yes  Instructions For The Office: Patient will need to schedule appt within the next 2 weeks for allergy symptoms/never diagnosed  RN Note:  Advised appt within the next 2 weeks for diagnosis. Per standing orders Allergy symptoms/zyrtec.   Caller expressed understanding. Encouraged to call back for questions, changes or concerns.  Symptoms  Reason For Call & Symptoms: Patient states she is having itching/tingling sensation in the back of her nose "Like allergies".  [place].  She thinks it may be allergies/sinus issues.  What can she take for allergies?  OTC medication.  Runny nose occasional clear,  itchy nose. Afebrile.  Reviewed Health History In EMR: Yes  Reviewed Medications In EMR: Yes  Reviewed Allergies In EMR: Yes  Reviewed Surgeries / Procedures: Yes  Date of Onset of Symptoms: 04/08/2013 OB / GYN:  LMP: Unknown  Guideline(s) Used:  Hay Fever - Nasal Allergies  Disposition Per Guideline:   See Within 2 Weeks in Office  Reason For Disposition Reached:   Nasal allergies occur only certain times of year and diagnosis of hay fever has never been confirmed by a physician  Advice Given:  Wash off Pollen Daily:  Remove pollen from the body with hair washing and a shower, especially before bedtime.  Avoiding Pollen:  Stay indoors on windy days  Use a high efficiency house air filter (HEPA or electrostatic)  Keep windows closed in car, turn AC on recirculate  Avoid playing with outdoor dog  For a Stuffy Nose - Use Nasal Washes:  Introduction: Saline (salt water) nasal irrigation (nasal washes) is an effective and simple home remedy for treating stuffy nose and sinus congestion.  The nose can be irrigated by pouring, spraying, or squirting salt water into the nose and then letting it run back out.  How it Helps: The salt water rinses out excess mucus, washes out any irritants (dust, allergens) that might be present, and moistens the nasal cavity.  Antihistamine Medications for Hay Fever:  Antihistamines help reduce sneezing, itching, and runny nose.  You may need to take antihistamines continuously during pollen season (Reason: continuously is the key to control).  Cetirizine is a newer (second generation) antihistamine. The dosage of cetirizine (e.g., OTC Zyrtec) is 10 mg once a day.  CAUTION: Antihistamines may cause sleepiness. Do not drink, drive, or operate dangerous machinery while taking antihistamines.  Call Back If:  Symptoms are not controlled in 2 days with continuous antihistamines  You become worse  Patient Will Follow Care Advice:  YES

## 2013-04-10 NOTE — Telephone Encounter (Signed)
Agree with zyrtec - may also use flonase previously prescribed (sent refill to pharmacy.)  If no better, rec come in for office visit. This week has been especially bad for allergies - high pollen

## 2013-04-10 NOTE — Telephone Encounter (Signed)
Patient notified

## 2013-05-29 ENCOUNTER — Other Ambulatory Visit: Payer: Self-pay | Admitting: Family Medicine

## 2013-05-29 NOTE — Telephone Encounter (Signed)
plz phone in. 

## 2013-05-29 NOTE — Telephone Encounter (Signed)
Rx called in as directed.   

## 2013-11-10 ENCOUNTER — Other Ambulatory Visit: Payer: Self-pay

## 2013-11-10 DIAGNOSIS — Z1231 Encounter for screening mammogram for malignant neoplasm of breast: Secondary | ICD-10-CM

## 2013-11-10 DIAGNOSIS — Z9882 Breast implant status: Secondary | ICD-10-CM

## 2013-11-11 ENCOUNTER — Other Ambulatory Visit: Payer: Self-pay

## 2013-11-11 MED ORDER — ALPRAZOLAM 0.5 MG PO TABS: ORAL_TABLET | ORAL | Status: DC

## 2013-11-11 NOTE — Telephone Encounter (Signed)
Pt left v/m requesting refill alprazolam to [place]. Pt last seen 12/17/2012 and has CPX scheduled 02/05/2014.

## 2013-11-11 NOTE — Telephone Encounter (Signed)
plz phone in. 

## 2013-11-12 NOTE — Telephone Encounter (Signed)
Rx called in as directed.   

## 2013-11-25 ENCOUNTER — Ambulatory Visit: Payer: Federal, State, Local not specified - PPO | Admitting: Obstetrics & Gynecology

## 2013-11-25 ENCOUNTER — Ambulatory Visit
Admission: RE | Admit: 2013-11-25 | Discharge: 2013-11-25 | Disposition: A | Discharge status: Home / Self Care | Payer: Federal, State, Local not specified - PPO | Source: Ambulatory Visit

## 2013-11-25 DIAGNOSIS — Z9882 Breast implant status: Secondary | ICD-10-CM

## 2013-11-25 DIAGNOSIS — Z1231 Encounter for screening mammogram for malignant neoplasm of breast: Secondary | ICD-10-CM

## 2013-11-26 LAB — HM MAMMOGRAPHY

## 2013-12-01 ENCOUNTER — Ambulatory Visit: Payer: Federal, State, Local not specified - PPO | Admitting: Obstetrics & Gynecology

## 2013-12-03 ENCOUNTER — Ambulatory Visit: Payer: Federal, State, Local not specified - PPO | Admitting: Obstetrics & Gynecology

## 2013-12-08 ENCOUNTER — Ambulatory Visit: Payer: Federal, State, Local not specified - PPO | Admitting: Obstetrics & Gynecology

## 2013-12-08 ENCOUNTER — Encounter: Payer: Self-pay | Admitting: Obstetrics & Gynecology

## 2013-12-08 ENCOUNTER — Ambulatory Visit (INDEPENDENT_AMBULATORY_CARE_PROVIDER_SITE_OTHER): Payer: Federal, State, Local not specified - PPO | Admitting: Obstetrics & Gynecology

## 2013-12-08 VITALS — BP 123/88 | HR 87 | Ht 62.0 in | Wt 132.0 lb

## 2013-12-08 DIAGNOSIS — Z01419 Encounter for gynecological examination (general) (routine) without abnormal findings: Secondary | ICD-10-CM

## 2013-12-08 DIAGNOSIS — Z124 Encounter for screening for malignant neoplasm of cervix: Secondary | ICD-10-CM

## 2013-12-08 DIAGNOSIS — Z1151 Encounter for screening for human papillomavirus (HPV): Secondary | ICD-10-CM

## 2013-12-08 DIAGNOSIS — N951 Menopausal and female climacteric states: Secondary | ICD-10-CM | POA: Diagnosis not present

## 2013-12-08 DIAGNOSIS — R87610 Atypical squamous cells of undetermined significance on cytologic smear of cervix (ASC-US): Secondary | ICD-10-CM | POA: Insufficient documentation

## 2013-12-08 NOTE — Progress Notes (Signed)
    GYNECOLOGY CLINIC ANNUAL PREVENTATIVE CARE ENCOUNTER NOTE  Subjective:     Krista Perez is a 43 y.o. [phone] [id number]  female here for a routine annual gynecologic exam.  Patient has been missing periods over past year; had periods every other month for six months, then none for last 4 months. No amenorrhea for 12 months yet. Rare vasomotor symptoms. No other GYN concerns.     Gynecologic History No LMP recorded. Patient is not currently having periods (Reason: Premenopausal).   Contraception: none Last Pap: Normal pap smear and negative high-risk HPV on 11/05/12 Last mammogram: 11/26/13. Results were: normal  Obstetric History OB History  Gravida Para Term Preterm AB SAB TAB Ectopic Multiple Living  3 1 0 1 2 2    1     # Outcome Date GA Lbr Len/2nd Weight Sex Delivery Anes PTL Lv  3 Preterm 07/06/07 [id number]   F CS-LTranv   Y     Complications: Severe preeclampsia  2 SAB           1 SAB              The following portions of the patient's history were reviewed and updated as appropriate: allergies, current medications, past family history, past medical history, past social history, past surgical history and problem list.  Review of Systems Pertinent items are noted in HPI.    Objective:   BP 123/88 mmHg  Pulse 87  Ht 5\' 2"  (1.575 m)  Wt 132 lb (59.875 kg)  BMI 24.14 kg/m2 GENERAL: Well-developed, well-nourished female in no acute distress.  HEENT: Normocephalic, atraumatic. Sclerae anicteric.  NECK: Supple. Normal thyroid.  LUNGS: Clear to auscultation bilaterally.  HEART: Regular rate and rhythm. BREASTS: Symmetric in size, implants in place. No masses, skin changes, nipple drainage, or lymphadenopathy. ABDOMEN: Soft, nontender, nondistended. No organomegaly. PELVIC: Normal external female genitalia. Vagina is pink and rugated.  Normal discharge. Normal cervix contour. Pap smear obtained. Uterus is normal in size. No adnexal mass or tenderness.  EXTREMITIES: No cyanosis,  clubbing, or edema, 2+ distal pulses.   Assessment:   Annual gynecologic examination of perimenopausal patient   Plan:   Pap done, will follow up results and manage accordingly. Patient will inform us of any debilitating vasomotor symptoms or bleeding concerns Routine preventative health maintenance measures emphasized   Jaynie CollinsUGONNA  Miriah Maruyama, MD, FACOG Attending Obstetrician & Gynecologist Center [facility]

## 2013-12-08 NOTE — Patient Instructions (Signed)
Preventive Care for Adults A healthy lifestyle and preventive care can promote health and wellness. Preventive health guidelines for women include the following key practices.  A routine yearly physical is a good way to check with your health care provider about your health and preventive screening. It is a chance to share any concerns and updates on your health and to receive a thorough exam.  Visit your dentist for a routine exam and preventive care every 6 months. Brush your teeth twice a day and floss once a day. Good oral hygiene prevents tooth decay and gum disease.  The frequency of eye exams is based on your age, health, family medical history, use of contact lenses, and other factors. Follow your health care provider's recommendations for frequency of eye exams.  Eat a healthy diet. Foods like vegetables, fruits, whole grains, low-fat dairy products, and lean protein foods contain the nutrients you need without too many calories. Decrease your intake of foods high in solid fats, added sugars, and salt. Eat the right amount of calories for you.Get information about a proper diet from your health care provider, if necessary.  Regular physical exercise is one of the most important things you can do for your health. Most adults should get at least 150 minutes of moderate-intensity exercise (any activity that increases your heart rate and causes you to sweat) each week. In addition, most adults need muscle-strengthening exercises on 2 or more days a week.  Maintain a healthy weight. The body mass index (BMI) is a screening tool to identify possible weight problems. It provides an estimate of body fat based on height and weight. Your health care provider can find your BMI and can help you achieve or maintain a healthy weight.For adults 20 years and older:  A BMI below 18.5 is considered underweight.  A BMI of 18.5 to 24.9 is normal.  A BMI of 25 to 29.9 is considered overweight.  A BMI of  30 and above is considered obese.  Maintain normal blood lipids and cholesterol levels by exercising and minimizing your intake of saturated fat. Eat a balanced diet with plenty of fruit and vegetables. Blood tests for lipids and cholesterol should begin at age 76 and be repeated every 5 years. If your lipid or cholesterol levels are high, you are over 50, or you are at high risk for heart disease, you may need your cholesterol levels checked more frequently.Ongoing high lipid and cholesterol levels should be treated with medicines if diet and exercise are not working.  If you smoke, find out from your health care provider how to quit. If you do not use tobacco, do not start.  Lung cancer screening is recommended for adults aged 22-80 years who are at high risk for developing lung cancer because of a history of smoking. A yearly low-dose CT scan of the lungs is recommended for people who have at least a 30-pack-year history of smoking and are a current smoker or have quit within the past 15 years. A pack year of smoking is smoking an average of 1 pack of cigarettes a day for 1 year (for example: 1 pack a day for 30 years or 2 packs a day for 15 years). Yearly screening should continue until the smoker has stopped smoking for at least 15 years. Yearly screening should be stopped for people who develop a health problem that would prevent them from having lung cancer treatment.  If you are pregnant, do not drink alcohol. If you are breastfeeding,  be very cautious about drinking alcohol. If you are not pregnant and choose to drink alcohol, do not have more than 1 drink per day. One drink is considered to be 12 ounces (355 mL) of beer, 5 ounces (148 mL) of wine, or 1.5 ounces (44 mL) of liquor.  Avoid use of street drugs. Do not share needles with anyone. Ask for help if you need support or instructions about stopping the use of drugs.  High blood pressure causes heart disease and increases the risk of  stroke. Your blood pressure should be checked at least every 1 to 2 years. Ongoing high blood pressure should be treated with medicines if weight loss and exercise do not work.  If you are 75-52 years old, ask your health care provider if you should take aspirin to prevent strokes.  Diabetes screening involves taking a blood sample to check your fasting blood sugar level. This should be done once every 3 years, after age 15, if you are within normal weight and without risk factors for diabetes. Testing should be considered at a younger age or be carried out more frequently if you are overweight and have at least 1 risk factor for diabetes.  Breast cancer screening is essential preventive care for women. You should practice "breast self-awareness." This means understanding the normal appearance and feel of your breasts and may include breast self-examination. Any changes detected, no matter how small, should be reported to a health care provider. Women in their 58s and 30s should have a clinical breast exam (CBE) by a health care provider as part of a regular health exam every 1 to 3 years. After age 16, women should have a CBE every year. Starting at age 53, women should consider having a mammogram (breast X-ray test) every year. Women who have a family history of breast cancer should talk to their health care provider about genetic screening. Women at a high risk of breast cancer should talk to their health care providers about having an MRI and a mammogram every year.  Breast cancer gene (BRCA)-related cancer risk assessment is recommended for women who have family members with BRCA-related cancers. BRCA-related cancers include breast, ovarian, tubal, and peritoneal cancers. Having family members with these cancers may be associated with an increased risk for harmful changes (mutations) in the breast cancer genes BRCA1 and BRCA2. Results of the assessment will determine the need for genetic counseling and  BRCA1 and BRCA2 testing.  Routine pelvic exams to screen for cancer are no longer recommended for nonpregnant women who are considered low risk for cancer of the pelvic organs (ovaries, uterus, and vagina) and who do not have symptoms. Ask your health care provider if a screening pelvic exam is right for you.  If you have had past treatment for cervical cancer or a condition that could lead to cancer, you need Pap tests and screening for cancer for at least 20 years after your treatment. If Pap tests have been discontinued, your risk factors (such as having a new sexual partner) need to be reassessed to determine if screening should be resumed. Some women have medical problems that increase the chance of getting cervical cancer. In these cases, your health care provider may recommend more frequent screening and Pap tests.  The HPV test is an additional test that may be used for cervical cancer screening. The HPV test looks for the virus that can cause the cell changes on the cervix. The cells collected during the Pap test can be  tested for HPV. The HPV test could be used to screen women aged 30 years and older, and should be used in women of any age who have unclear Pap test results. After the age of 30, women should have HPV testing at the same frequency as a Pap test.  Colorectal cancer can be detected and often prevented. Most routine colorectal cancer screening begins at the age of 50 years and continues through age 75 years. However, your health care provider may recommend screening at an earlier age if you have risk factors for colon cancer. On a yearly basis, your health care provider may provide home test kits to check for hidden blood in the stool. Use of a small camera at the end of a tube, to directly examine the colon (sigmoidoscopy or colonoscopy), can detect the earliest forms of colorectal cancer. Talk to your health care provider about this at age 50, when routine screening begins. Direct  exam of the colon should be repeated every 5-10 years through age 75 years, unless early forms of pre-cancerous polyps or small growths are found.  People who are at an increased risk for hepatitis B should be screened for this virus. You are considered at high risk for hepatitis B if:  You were born in a country where hepatitis B occurs often. Talk with your health care provider about which countries are considered high risk.  Your parents were born in a high-risk country and you have not received a shot to protect against hepatitis B (hepatitis B vaccine).  You have HIV or AIDS.  You use needles to inject street drugs.  You live with, or have sex with, someone who has hepatitis B.  You get hemodialysis treatment.  You take certain medicines for conditions like cancer, organ transplantation, and autoimmune conditions.  Hepatitis C blood testing is recommended for all people born from 1945 through 1965 and any individual with known risks for hepatitis C.  Practice safe sex. Use condoms and avoid high-risk sexual practices to reduce the spread of sexually transmitted infections (STIs). STIs include gonorrhea, chlamydia, syphilis, trichomonas, herpes, HPV, and human immunodeficiency virus (HIV). Herpes, HIV, and HPV are viral illnesses that have no cure. They can result in disability, cancer, and death.  You should be screened for sexually transmitted illnesses (STIs) including gonorrhea and chlamydia if:  You are sexually active and are younger than 24 years.  You are older than 24 years and your health care provider tells you that you are at risk for this type of infection.  Your sexual activity has changed since you were last screened and you are at an increased risk for chlamydia or gonorrhea. Ask your health care provider if you are at risk.  If you are at risk of being infected with HIV, it is recommended that you take a prescription medicine daily to prevent HIV infection. This is  called preexposure prophylaxis (PrEP). You are considered at risk if:  You are a heterosexual woman, are sexually active, and are at increased risk for HIV infection.  You take drugs by injection.  You are sexually active with a partner who has HIV.  Talk with your health care provider about whether you are at high risk of being infected with HIV. If you choose to begin PrEP, you should first be tested for HIV. You should then be tested every 3 months for as long as you are taking PrEP.  Osteoporosis is a disease in which the bones lose minerals and strength   with aging. This can result in serious bone fractures or breaks. The risk of osteoporosis can be identified using a bone density scan. Women ages 65 years and over and women at risk for fractures or osteoporosis should discuss screening with their health care providers. Ask your health care provider whether you should take a calcium supplement or vitamin D to reduce the rate of osteoporosis.  Menopause can be associated with physical symptoms and risks. Hormone replacement therapy is available to decrease symptoms and risks. You should talk to your health care provider about whether hormone replacement therapy is right for you.  Use sunscreen. Apply sunscreen liberally and repeatedly throughout the day. You should seek shade when your shadow is shorter than you. Protect yourself by wearing long sleeves, pants, a wide-brimmed hat, and sunglasses year round, whenever you are outdoors.  Once a month, do a whole body skin exam, using a mirror to look at the skin on your back. Tell your health care provider of new moles, moles that have irregular borders, moles that are larger than a pencil eraser, or moles that have changed in shape or color.  Stay current with required vaccines (immunizations).  Influenza vaccine. All adults should be immunized every year.  Tetanus, diphtheria, and acellular pertussis (Td, Tdap) vaccine. Pregnant women should  receive 1 dose of Tdap vaccine during each pregnancy. The dose should be obtained regardless of the length of time since the last dose. Immunization is preferred during the 27th-36th week of gestation. An adult who has not previously received Tdap or who does not know her vaccine status should receive 1 dose of Tdap. This initial dose should be followed by tetanus and diphtheria toxoids (Td) booster doses every 10 years. Adults with an unknown or incomplete history of completing a 3-dose immunization series with Td-containing vaccines should begin or complete a primary immunization series including a Tdap dose. Adults should receive a Td booster every 10 years.  Varicella vaccine. An adult without evidence of immunity to varicella should receive 2 doses or a second dose if she has previously received 1 dose. Pregnant females who do not have evidence of immunity should receive the first dose after pregnancy. This first dose should be obtained before leaving the health care facility. The second dose should be obtained 4-8 weeks after the first dose.  Human papillomavirus (HPV) vaccine. Females aged 13-26 years who have not received the vaccine previously should obtain the 3-dose series. The vaccine is not recommended for use in pregnant females. However, pregnancy testing is not needed before receiving a dose. If a female is found to be pregnant after receiving a dose, no treatment is needed. In that case, the remaining doses should be delayed until after the pregnancy. Immunization is recommended for any person with an immunocompromised condition through the age of 26 years if she did not get any or all doses earlier. During the 3-dose series, the second dose should be obtained 4-8 weeks after the first dose. The third dose should be obtained 24 weeks after the first dose and 16 weeks after the second dose.  Zoster vaccine. One dose is recommended for adults aged 60 years or older unless certain conditions are  present.  Measles, mumps, and rubella (MMR) vaccine. Adults born before 1957 generally are considered immune to measles and mumps. Adults born in 1957 or later should have 1 or more doses of MMR vaccine unless there is a contraindication to the vaccine or there is laboratory evidence of immunity to   each of the three diseases. A routine second dose of MMR vaccine should be obtained at least 28 days after the first dose for students attending postsecondary schools, health care workers, or international travelers. People who received inactivated measles vaccine or an unknown type of measles vaccine during 1963-1967 should receive 2 doses of MMR vaccine. People who received inactivated mumps vaccine or an unknown type of mumps vaccine before 1979 and are at high risk for mumps infection should consider immunization with 2 doses of MMR vaccine. For females of childbearing age, rubella immunity should be determined. If there is no evidence of immunity, females who are not pregnant should be vaccinated. If there is no evidence of immunity, females who are pregnant should delay immunization until after pregnancy. Unvaccinated health care workers born before 1957 who lack laboratory evidence of measles, mumps, or rubella immunity or laboratory confirmation of disease should consider measles and mumps immunization with 2 doses of MMR vaccine or rubella immunization with 1 dose of MMR vaccine.  Pneumococcal 13-valent conjugate (PCV13) vaccine. When indicated, a person who is uncertain of her immunization history and has no record of immunization should receive the PCV13 vaccine. An adult aged 19 years or older who has certain medical conditions and has not been previously immunized should receive 1 dose of PCV13 vaccine. This PCV13 should be followed with a dose of pneumococcal polysaccharide (PPSV23) vaccine. The PPSV23 vaccine dose should be obtained at least 8 weeks after the dose of PCV13 vaccine. An adult aged 19  years or older who has certain medical conditions and previously received 1 or more doses of PPSV23 vaccine should receive 1 dose of PCV13. The PCV13 vaccine dose should be obtained 1 or more years after the last PPSV23 vaccine dose.  Pneumococcal polysaccharide (PPSV23) vaccine. When PCV13 is also indicated, PCV13 should be obtained first. All adults aged 65 years and older should be immunized. An adult younger than age 65 years who has certain medical conditions should be immunized. Any person who resides in a nursing home or long-term care facility should be immunized. An adult smoker should be immunized. People with an immunocompromised condition and certain other conditions should receive both PCV13 and PPSV23 vaccines. People with human immunodeficiency virus (HIV) infection should be immunized as soon as possible after diagnosis. Immunization during chemotherapy or radiation therapy should be avoided. Routine use of PPSV23 vaccine is not recommended for American Indians, [place] Natives, or people younger than 65 years unless there are medical conditions that require PPSV23 vaccine. When indicated, people who have unknown immunization and have no record of immunization should receive PPSV23 vaccine. One-time revaccination 5 years after the first dose of PPSV23 is recommended for people aged 19-64 years who have chronic kidney failure, nephrotic syndrome, asplenia, or immunocompromised conditions. People who received 1-2 doses of PPSV23 before age 65 years should receive another dose of PPSV23 vaccine at age 65 years or later if at least 5 years have passed since the previous dose. Doses of PPSV23 are not needed for people immunized with PPSV23 at or after age 65 years.  Meningococcal vaccine. Adults with asplenia or persistent complement component deficiencies should receive 2 doses of quadrivalent meningococcal conjugate (MenACWY-D) vaccine. The doses should be obtained at least 2 months apart.  Microbiologists working with certain meningococcal bacteria, military recruits, people at risk during an outbreak, and people who travel to or live in countries with a high rate of meningitis should be immunized. A first-year college student up through age   21 years who is living in a residence hall should receive a dose if she did not receive a dose on or after her 16th birthday. Adults who have certain high-risk conditions should receive one or more doses of vaccine.  Hepatitis A vaccine. Adults who wish to be protected from this disease, have certain high-risk conditions, work with hepatitis A-infected animals, work in hepatitis A research labs, or travel to or work in countries with a high rate of hepatitis A should be immunized. Adults who were previously unvaccinated and who anticipate close contact with an international adoptee during the first 60 days after arrival in the [place] from a country with a high rate of hepatitis A should be immunized.  Hepatitis B vaccine. Adults who wish to be protected from this disease, have certain high-risk conditions, may be exposed to blood or other infectious body fluids, are household contacts or sex partners of hepatitis B positive people, are clients or workers in certain care facilities, or travel to or work in countries with a high rate of hepatitis B should be immunized.  Haemophilus influenzae type b (Hib) vaccine. A previously unvaccinated person with asplenia or sickle cell disease or having a scheduled splenectomy should receive 1 dose of Hib vaccine. Regardless of previous immunization, a recipient of a hematopoietic stem cell transplant should receive a 3-dose series 6-12 months after her successful transplant. Hib vaccine is not recommended for adults with HIV infection. Preventive Services / Frequency Ages 64 to 68 years  Blood pressure check.** / Every 1 to 2 years.  Lipid and cholesterol check.** / Every 5 years beginning at age  22.  Clinical breast exam.** / Every 3 years for women in their 88s and 53s.  BRCA-related cancer risk assessment.** / For women who have family members with a BRCA-related cancer (breast, ovarian, tubal, or peritoneal cancers).  Pap test.** / Every 2 years from ages 90 through 51. Every 3 years starting at age 21 through age 56 or 3 with a history of 3 consecutive normal Pap tests.  HPV screening.** / Every 3 years from ages 24 through ages 1 to 46 with a history of 3 consecutive normal Pap tests.  Hepatitis C blood test.** / For any individual with known risks for hepatitis C.  Skin self-exam. / Monthly.  Influenza vaccine. / Every year.  Tetanus, diphtheria, and acellular pertussis (Tdap, Td) vaccine.** / Consult your health care provider. Pregnant women should receive 1 dose of Tdap vaccine during each pregnancy. 1 dose of Td every 10 years.  Varicella vaccine.** / Consult your health care provider. Pregnant females who do not have evidence of immunity should receive the first dose after pregnancy.  HPV vaccine. / 3 doses over 6 months, if 72 and younger. The vaccine is not recommended for use in pregnant females. However, pregnancy testing is not needed before receiving a dose.  Measles, mumps, rubella (MMR) vaccine.** / You need at least 1 dose of MMR if you were born in 1957 or later. You may also need a 2nd dose. For females of childbearing age, rubella immunity should be determined. If there is no evidence of immunity, females who are not pregnant should be vaccinated. If there is no evidence of immunity, females who are pregnant should delay immunization until after pregnancy.  Pneumococcal 13-valent conjugate (PCV13) vaccine.** / Consult your health care provider.  Pneumococcal polysaccharide (PPSV23) vaccine.** / 1 to 2 doses if you smoke cigarettes or if you have certain conditions.  Meningococcal vaccine.** /  1 dose if you are age 19 to 21 years and a first-year college  student living in a residence hall, or have one of several medical conditions, you need to get vaccinated against meningococcal disease. You may also need additional booster doses.  Hepatitis A vaccine.** / Consult your health care provider.  Hepatitis B vaccine.** / Consult your health care provider.  Haemophilus influenzae type b (Hib) vaccine.** / Consult your health care provider. Ages 40 to 64 years  Blood pressure check.** / Every 1 to 2 years.  Lipid and cholesterol check.** / Every 5 years beginning at age 20 years.  Lung cancer screening. / Every year if you are aged 55-80 years and have a 30-pack-year history of smoking and currently smoke or have quit within the past 15 years. Yearly screening is stopped once you have quit smoking for at least 15 years or develop a health problem that would prevent you from having lung cancer treatment.  Clinical breast exam.** / Every year after age 40 years.  BRCA-related cancer risk assessment.** / For women who have family members with a BRCA-related cancer (breast, ovarian, tubal, or peritoneal cancers).  Mammogram.** / Every year beginning at age 40 years and continuing for as long as you are in good health. Consult with your health care provider.  Pap test.** / Every 3 years starting at age 30 years through age 65 or 70 years with a history of 3 consecutive normal Pap tests.  HPV screening.** / Every 3 years from ages 30 years through ages 65 to 70 years with a history of 3 consecutive normal Pap tests.  Fecal occult blood test (FOBT) of stool. / Every year beginning at age 50 years and continuing until age 75 years. You may not need to do this test if you get a colonoscopy every 10 years.  Flexible sigmoidoscopy or colonoscopy.** / Every 5 years for a flexible sigmoidoscopy or every 10 years for a colonoscopy beginning at age 50 years and continuing until age 75 years.  Hepatitis C blood test.** / For all people born from 1945 through  1965 and any individual with known risks for hepatitis C.  Skin self-exam. / Monthly.  Influenza vaccine. / Every year.  Tetanus, diphtheria, and acellular pertussis (Tdap/Td) vaccine.** / Consult your health care provider. Pregnant women should receive 1 dose of Tdap vaccine during each pregnancy. 1 dose of Td every 10 years.  Varicella vaccine.** / Consult your health care provider. Pregnant females who do not have evidence of immunity should receive the first dose after pregnancy.  Zoster vaccine.** / 1 dose for adults aged 60 years or older.  Measles, mumps, rubella (MMR) vaccine.** / You need at least 1 dose of MMR if you were born in 1957 or later. You may also need a 2nd dose. For females of childbearing age, rubella immunity should be determined. If there is no evidence of immunity, females who are not pregnant should be vaccinated. If there is no evidence of immunity, females who are pregnant should delay immunization until after pregnancy.  Pneumococcal 13-valent conjugate (PCV13) vaccine.** / Consult your health care provider.  Pneumococcal polysaccharide (PPSV23) vaccine.** / 1 to 2 doses if you smoke cigarettes or if you have certain conditions.  Meningococcal vaccine.** / Consult your health care provider.  Hepatitis A vaccine.** / Consult your health care provider.  Hepatitis B vaccine.** / Consult your health care provider.  Haemophilus influenzae type b (Hib) vaccine.** / Consult your health care provider. Ages 65   years and over  Blood pressure check.** / Every 1 to 2 years.  Lipid and cholesterol check.** / Every 5 years beginning at age 22 years.  Lung cancer screening. / Every year if you are aged 73-80 years and have a 30-pack-year history of smoking and currently smoke or have quit within the past 15 years. Yearly screening is stopped once you have quit smoking for at least 15 years or develop a health problem that would prevent you from having lung cancer  treatment.  Clinical breast exam.** / Every year after age 4 years.  BRCA-related cancer risk assessment.** / For women who have family members with a BRCA-related cancer (breast, ovarian, tubal, or peritoneal cancers).  Mammogram.** / Every year beginning at age 40 years and continuing for as long as you are in good health. Consult with your health care provider.  Pap test.** / Every 3 years starting at age 9 years through age 34 or 91 years with 3 consecutive normal Pap tests. Testing can be stopped between 65 and 70 years with 3 consecutive normal Pap tests and no abnormal Pap or HPV tests in the past 10 years.  HPV screening.** / Every 3 years from ages 57 years through ages 64 or 45 years with a history of 3 consecutive normal Pap tests. Testing can be stopped between 65 and 70 years with 3 consecutive normal Pap tests and no abnormal Pap or HPV tests in the past 10 years.  Fecal occult blood test (FOBT) of stool. / Every year beginning at age 15 years and continuing until age 17 years. You may not need to do this test if you get a colonoscopy every 10 years.  Flexible sigmoidoscopy or colonoscopy.** / Every 5 years for a flexible sigmoidoscopy or every 10 years for a colonoscopy beginning at age 86 years and continuing until age 71 years.  Hepatitis C blood test.** / For all people born from [birth year] through 1966 and any individual with known risks for hepatitis C.  Osteoporosis screening.** / A one-time screening for women ages 83 years and over and women at risk for fractures or osteoporosis.  Skin self-exam. / Monthly.  Influenza vaccine. / Every year.  Tetanus, diphtheria, and acellular pertussis (Tdap/Td) vaccine.** / 1 dose of Td every 10 years.  Varicella vaccine.** / Consult your health care provider.  Zoster vaccine.** / 1 dose for adults aged 61 years or older.  Pneumococcal 13-valent conjugate (PCV13) vaccine.** / Consult your health care provider.  Pneumococcal  polysaccharide (PPSV23) vaccine.** / 1 dose for all adults aged 28 years and older.  Meningococcal vaccine.** / Consult your health care provider.  Hepatitis A vaccine.** / Consult your health care provider.  Hepatitis B vaccine.** / Consult your health care provider.  Haemophilus influenzae type b (Hib) vaccine.** / Consult your health care provider. ** Family history and personal history of risk and conditions may change your health care provider's recommendations. Document Released: 03/07/2001 Document Revised: 05/26/2013 Document Reviewed: 06/06/2010 [facility] Patient Information 2015 [place], [place]. This information is not intended to replace advice given to you by your health care provider. Make sure you discuss any questions you have with your health care provider.

## 2013-12-10 LAB — CYTOLOGY - PAP

## 2013-12-11 ENCOUNTER — Encounter: Payer: Self-pay | Admitting: Obstetrics & Gynecology

## 2014-01-28 ENCOUNTER — Other Ambulatory Visit: Payer: Self-pay | Admitting: Family Medicine

## 2014-01-28 DIAGNOSIS — E785 Hyperlipidemia, unspecified: Secondary | ICD-10-CM

## 2014-01-29 ENCOUNTER — Other Ambulatory Visit (INDEPENDENT_AMBULATORY_CARE_PROVIDER_SITE_OTHER): Payer: Federal, State, Local not specified - PPO

## 2014-01-29 DIAGNOSIS — E785 Hyperlipidemia, unspecified: Secondary | ICD-10-CM

## 2014-01-29 LAB — TSH: TSH: 2.63 u[IU]/mL (ref 0.35–4.50)

## 2014-01-29 LAB — LIPID PANEL
Cholesterol: 282 mg/dL — ABNORMAL HIGH (ref 0–200)
HDL: 44.1 mg/dL (ref >39.00)
LDL CALC: 205 mg/dL — AB (ref 0–99)
NonHDL: 237.9
Total CHOL/HDL Ratio: 6
Triglycerides: 167 mg/dL — ABNORMAL HIGH (ref 0.0–149.0)
VLDL: 33.4 mg/dL (ref 0.0–40.0)

## 2014-01-29 LAB — BASIC METABOLIC PANEL
BUN: 9 mg/dL (ref 6–23)
CO2: 27 meq/L (ref 19–32)
CREATININE: 0.5 mg/dL (ref 0.4–1.2)
Calcium: 9.2 mg/dL (ref 8.4–10.5)
Chloride: 104 mEq/L (ref 96–112)
GFR: 132.72 mL/min (ref >60.00)
Glucose, Bld: 95 mg/dL (ref 70–99)
POTASSIUM: 3.8 meq/L (ref 3.5–5.1)
SODIUM: 136 meq/L (ref 135–145)

## 2014-02-05 ENCOUNTER — Encounter: Payer: Self-pay | Admitting: Family Medicine

## 2014-02-05 ENCOUNTER — Ambulatory Visit (INDEPENDENT_AMBULATORY_CARE_PROVIDER_SITE_OTHER): Payer: Federal, State, Local not specified - PPO | Admitting: Family Medicine

## 2014-02-05 VITALS — BP 108/64 | HR 68 | Temp 98.7°F | Ht 62.0 in | Wt 133.5 lb

## 2014-02-05 DIAGNOSIS — Z0001 Encounter for general adult medical examination with abnormal findings: Secondary | ICD-10-CM | POA: Insufficient documentation

## 2014-02-05 DIAGNOSIS — E785 Hyperlipidemia, unspecified: Secondary | ICD-10-CM

## 2014-02-05 DIAGNOSIS — Z0000 Encounter for general adult medical examination without abnormal findings: Secondary | ICD-10-CM | POA: Insufficient documentation

## 2014-02-05 DIAGNOSIS — R002 Palpitations: Secondary | ICD-10-CM | POA: Insufficient documentation

## 2014-02-05 NOTE — Assessment & Plan Note (Signed)
Reviewed #s. Not truly fasting. Pt will return tomorrow for fasting labs.

## 2014-02-05 NOTE — Progress Notes (Signed)
BP 108/64 mmHg  Pulse 68  Temp(Src) 98.7 F (37.1 C) (Oral)  Ht 5\' 2"  (1.575 m)  Wt 133 lb 8 oz (60.555 kg)  BMI 24.41 kg/m2   CC: CPE  Subjective:    Patient ID: Krista Perez, female    DOB: Dec 03, 1970, 44 y.o.   MRN: [id number]  HPI: Krista Perez is a 44 y.o. female presenting on 02/05/2014 for Annual Exam   This morning felt 3 second palpitations while seated in care. This was associated with lightheaded and slightly elevated diastolic blood pressure. This has happed in past. No associated headache, chest pain or dyspnea.   Intermittent migraines associated with vertigo recently.  Self treats with advil prn.   Perimenopausal. No period in last 8 months. No recent hot flashes.   Rare alprazolam use prn insomnia.   Preventative: Well woman established with OBGYN Dr Macon LargeAnyanwu last done 11/2013 WNL. Pap normal 11/2013 - ASCUS with neg HRHPV. Rpt due next year. Mammogram WNL 10/2012.  Tetanus - thinks 2011  Flu shot - declines Completed hep B.  Seat belt use discussed. No sunburns in last year. Sunscreen use discussed. Notices changing mole which I will review.   Caffeine: rare decaf  Lives with husband and daughter (2009) and 1 dog  Occupation: housewife, Orthoptistmedical translator. Prior lived in [place], husband is [employer] agent  Activity: gym 3x/wk  Diet: cooks at home. Good water, fruits/vegetables daily  Relevant past medical, surgical, family and social history reviewed and updated as indicated. Interim medical history since our last visit reviewed. Allergies and medications reviewed and updated. Current Outpatient Prescriptions on File Prior to Visit  Medication Sig  . ALPRAZolam (XANAX) 0.5 MG tablet TAKE 1 TABLET BY MOUTH AT BEDTIME AS NEEDED FOR SLEEP OR ANXIETY.   No current facility-administered medications on file prior to visit.    Review of Systems  Constitutional: Negative for fever, chills, activity change, appetite change, fatigue and unexpected  weight change.  HENT: Negative for hearing loss.   Eyes: Negative for visual disturbance.  Respiratory: Negative for cough, chest tightness, shortness of breath and wheezing.   Cardiovascular: Negative for chest pain, palpitations and leg swelling.  Gastrointestinal: Negative for nausea, vomiting, abdominal pain, diarrhea, constipation, blood in stool and abdominal distention.  Genitourinary: Negative for hematuria and difficulty urinating.  Musculoskeletal: Negative for myalgias, arthralgias and neck pain.  Skin: Negative for rash.  Neurological: Positive for headaches. Negative for dizziness, seizures and syncope.  Hematological: Negative for adenopathy. Does not bruise/bleed easily.  Psychiatric/Behavioral: Negative for dysphoric mood. The patient is not nervous/anxious.    Per HPI unless specifically indicated above     Objective:    BP 108/64 mmHg  Pulse 68  Temp(Src) 98.7 F (37.1 C) (Oral)  Ht 5\' 2"  (1.575 m)  Wt 133 lb 8 oz (60.555 kg)  BMI 24.41 kg/m2  Wt Readings from Last 3 Encounters:  02/05/14 133 lb 8 oz (60.555 kg)  12/08/13 132 lb (59.875 kg)  03/29/13 123 lb 4 oz (55.906 kg)    Physical Exam  Constitutional: She is oriented to person, place, and time. She appears well-developed and well-nourished. No distress.  HENT:  Head: Normocephalic and atraumatic.  Right Ear: Hearing, tympanic membrane, external ear and ear canal normal.  Left Ear: Hearing, tympanic membrane, external ear and ear canal normal.  Nose: Nose normal.  Mouth/Throat: Uvula is midline, oropharynx is clear and moist and mucous membranes are normal. No oropharyngeal exudate, posterior oropharyngeal edema or posterior oropharyngeal  erythema.  Eyes: Conjunctivae and EOM are normal. Pupils are equal, round, and reactive to light. No scleral icterus.  Neck: Normal range of motion. Neck supple. No thyromegaly present.  Cardiovascular: Normal rate, regular rhythm, normal heart sounds and intact distal  pulses.   No murmur heard. Pulses:      Radial pulses are 2+ on the right side, and 2+ on the left side.  Pulmonary/Chest: Effort normal and breath sounds normal. No respiratory distress. She has no wheezes. She has no rales.  Abdominal: Soft. Bowel sounds are normal. She exhibits no distension and no mass. There is no tenderness. There is no rebound and no guarding.  Musculoskeletal: Normal range of motion. She exhibits no edema.  Lymphadenopathy:    She has no cervical adenopathy.  Neurological: She is alert and oriented to person, place, and time.  CN grossly intact, station and gait intact  Skin: Skin is warm and dry. No rash noted.  Psychiatric: She has a normal mood and affect. Her behavior is normal. Judgment and thought content normal.  Nursing note and vitals reviewed.  Results for orders placed or performed in visit on 01/29/14  Lipid panel  Result Value Ref Range   Cholesterol 282 (H) 0 - 200 mg/dL   Triglycerides 161.0 (H) 0.0 - 149.0 mg/dL   HDL 96.04 >54.09 mg/dL   VLDL 81.1 0.0 - 91.4 mg/dL   LDL Cholesterol 782 (H) 0 - 99 mg/dL   Total CHOL/HDL Ratio 6    NonHDL 237.90   TSH  Result Value Ref Range   TSH 2.63 0.35 - 4.50 uIU/mL  Basic metabolic panel  Result Value Ref Range   Sodium 136 135 - 145 mEq/L   Potassium 3.8 3.5 - 5.1 mEq/L   Chloride 104 96 - 112 mEq/L   CO2 27 19 - 32 mEq/L   Glucose, Bld 95 70 - 99 mg/dL   BUN 9 6 - 23 mg/dL   Creatinine, Ser 0.5 0.4 - 1.2 mg/dL   Calcium 9.2 8.4 - 95.6 mg/dL   GFR 213.08 >65.78 mL/min      Assessment & Plan:   Problem List Items Addressed This Visit    Palpitations    Mild. Check CBC tomorrow. Pt will return if more frequent/intense episodes.      Relevant Orders   CBC with Differential   HLD (hyperlipidemia)    Reviewed #s. Not truly fasting. Pt will return tomorrow for fasting labs.      Relevant Orders   Lipid panel   Hepatic function panel   Health maintenance examination - Primary     Preventative protocols reviewed and updated unless pt declined. Discussed healthy diet and lifestyle.          Follow up plan: Return in about 1 year (around 02/06/2015), or as needed, for annual exam, prior fasting for blood work.

## 2014-02-05 NOTE — Patient Instructions (Addendum)
Regresar manana para repetir laboratorios. De mas todo va bien. Dejeme saber si siente palpitaciones mas frequentes.

## 2014-02-05 NOTE — Assessment & Plan Note (Signed)
Preventative protocols reviewed and updated unless pt declined. Discussed healthy diet and lifestyle.  

## 2014-02-05 NOTE — Assessment & Plan Note (Signed)
Mild. Check CBC tomorrow. Pt will return if more frequent/intense episodes.

## 2014-02-05 NOTE — Progress Notes (Signed)
Pre visit review using our clinic review tool, if applicable. No additional management support is needed unless otherwise documented below in the visit note. 

## 2014-02-06 ENCOUNTER — Other Ambulatory Visit: Payer: Federal, State, Local not specified - PPO

## 2014-02-09 ENCOUNTER — Other Ambulatory Visit (INDEPENDENT_AMBULATORY_CARE_PROVIDER_SITE_OTHER): Payer: Federal, State, Local not specified - PPO

## 2014-02-09 ENCOUNTER — Encounter: Payer: Self-pay | Admitting: *Deleted

## 2014-02-09 DIAGNOSIS — E785 Hyperlipidemia, unspecified: Secondary | ICD-10-CM

## 2014-02-09 DIAGNOSIS — R002 Palpitations: Secondary | ICD-10-CM

## 2014-02-09 LAB — LIPID PANEL
Cholesterol: 215 mg/dL — ABNORMAL HIGH (ref 0–200)
HDL: 44.3 mg/dL (ref >39.00)
LDL CALC: 143 mg/dL — AB (ref 0–99)
NonHDL: 170.7
Total CHOL/HDL Ratio: 5
Triglycerides: 139 mg/dL (ref 0.0–149.0)
VLDL: 27.8 mg/dL (ref 0.0–40.0)

## 2014-02-09 LAB — CBC WITH DIFFERENTIAL/PLATELET
BASOS ABS: 0 10*3/uL (ref 0.0–0.1)
BASOS PCT: 0.6 % (ref 0.0–3.0)
Eosinophils Absolute: 0.2 10*3/uL (ref 0.0–0.7)
Eosinophils Relative: 2.7 % (ref 0.0–5.0)
HCT: 38.9 % (ref 36.0–46.0)
Hemoglobin: 12.9 g/dL (ref 12.0–15.0)
LYMPHS ABS: 1.4 10*3/uL (ref 0.7–4.0)
Lymphocytes Relative: 25.1 % (ref 12.0–46.0)
MCHC: 33.1 g/dL (ref 30.0–36.0)
MCV: 84 fl (ref 78.0–100.0)
MONO ABS: 0.4 10*3/uL (ref 0.1–1.0)
Monocytes Relative: 6.3 % (ref 3.0–12.0)
Neutro Abs: 3.8 10*3/uL (ref 1.4–7.7)
Neutrophils Relative %: 65.3 % (ref 43.0–77.0)
Platelets: 249 10*3/uL (ref 150.0–400.0)
RBC: 4.64 Mil/uL (ref 3.87–5.11)
RDW: 13.5 % (ref 11.5–15.5)
WBC: 5.8 10*3/uL (ref 4.0–10.5)

## 2014-02-09 LAB — HEPATIC FUNCTION PANEL
ALK PHOS: 53 U/L (ref 39–117)
ALT: 10 U/L (ref 0–35)
AST: 14 U/L (ref 0–37)
Albumin: 3.9 g/dL (ref 3.5–5.2)
BILIRUBIN DIRECT: 0 mg/dL (ref 0.0–0.3)
Total Bilirubin: 0.4 mg/dL (ref 0.2–1.2)
Total Protein: 7 g/dL (ref 6.0–8.3)

## 2014-02-10 ENCOUNTER — Encounter: Payer: Self-pay | Admitting: Family Medicine

## 2014-02-19 ENCOUNTER — Encounter: Payer: Self-pay | Admitting: Family Medicine

## 2014-07-20 ENCOUNTER — Other Ambulatory Visit: Payer: Self-pay

## 2014-07-20 MED ORDER — ALPRAZOLAM 0.5 MG PO TABS: ORAL_TABLET | ORAL | Status: DC

## 2014-07-20 NOTE — Telephone Encounter (Signed)
Pt left v/m [facility] [place]. Please advise. rx last refilled # 30 on 11/11/2013 and last annual exam 02/05/2014.

## 2014-07-20 NOTE — Telephone Encounter (Signed)
[facility] [place].  Thanks.

## 2014-07-21 ENCOUNTER — Telehealth: Payer: Self-pay

## 2014-07-21 MED ORDER — RAMELTEON 8 MG PO TABS: 8.0000 mg | ORAL_TABLET | Freq: Every evening | ORAL | Status: DC | PRN

## 2014-07-21 NOTE — Telephone Encounter (Signed)
Pt called back, very anxious not sleeping, explained that clinical staff requests 24 -48 hours for med refill.  She understands, but would like a call when medication has been called in.  Best number is [phone] / lt

## 2014-07-21 NOTE — Telephone Encounter (Signed)
Pt got alprazolam refill but pt request sleeping pill; pt does not think alprazolam will be enough to help pt sleep; pt request cb when med [facility] [place].Please advise.

## 2014-07-21 NOTE — Telephone Encounter (Signed)
Rx called in as directed. Patient notified.  

## 2014-07-21 NOTE — Telephone Encounter (Signed)
Patient notified

## 2014-07-21 NOTE — Telephone Encounter (Signed)
plz notify rozerem sent in. It works similar to melatonin but stronger.

## 2014-10-20 ENCOUNTER — Other Ambulatory Visit: Payer: Self-pay | Admitting: Family Medicine

## 2014-10-20 NOTE — Telephone Encounter (Signed)
Ok to refill 

## 2014-10-21 NOTE — Telephone Encounter (Signed)
Rx called in as directed.   

## 2014-10-21 NOTE — Telephone Encounter (Signed)
plz phone in. Increased stress with mother's illness.

## 2014-11-26 ENCOUNTER — Other Ambulatory Visit (INDEPENDENT_AMBULATORY_CARE_PROVIDER_SITE_OTHER): Payer: Federal, State, Local not specified - PPO

## 2014-11-26 ENCOUNTER — Telehealth: Payer: Self-pay

## 2014-11-26 ENCOUNTER — Ambulatory Visit (INDEPENDENT_AMBULATORY_CARE_PROVIDER_SITE_OTHER): Payer: Federal, State, Local not specified - PPO

## 2014-11-26 ENCOUNTER — Other Ambulatory Visit: Payer: Self-pay

## 2014-11-26 DIAGNOSIS — Z111 Encounter for screening for respiratory tuberculosis: Secondary | ICD-10-CM | POA: Diagnosis not present

## 2014-11-26 DIAGNOSIS — Z1231 Encounter for screening mammogram for malignant neoplasm of breast: Secondary | ICD-10-CM

## 2014-11-26 DIAGNOSIS — Z23 Encounter for immunization: Secondary | ICD-10-CM

## 2014-11-26 NOTE — Telephone Encounter (Signed)
Patient came in for labs and the front office/Terri handled patient request.

## 2014-11-26 NOTE — Telephone Encounter (Signed)
pts employer requiring pt to have blood test for tb rather than tb skin test due to increased number of TB pts. Pt also needs drug screening panel done. Pt does not know specific drugs to be tested for; pt said just needs screening test done. Pt request cb.

## 2014-11-26 NOTE — Telephone Encounter (Signed)
May come in for TB quantiferon test - ordered. She had drug screen done 01/2014 - will this suffice? If not, would get more details on what exactly is required by work to see if it can be done here.

## 2014-11-27 ENCOUNTER — Ambulatory Visit
Admission: RE | Admit: 2014-11-27 | Discharge: 2014-11-27 | Disposition: A | Discharge status: Home / Self Care | Payer: Federal, State, Local not specified - PPO | Source: Ambulatory Visit

## 2014-11-27 DIAGNOSIS — Z1231 Encounter for screening mammogram for malignant neoplasm of breast: Secondary | ICD-10-CM

## 2014-11-28 LAB — QUANTIFERON TB GOLD ASSAY (BLOOD)
INTERFERON GAMMA RELEASE ASSAY: NEGATIVE
Mitogen value: >10 IU/mL
QUANTIFERON NIL VALUE: 0.01 [IU]/mL
Quantiferon Tb Ag Minus Nil Value: 0.01 IU/mL
TB AG VALUE: 0.02 [IU]/mL

## 2014-11-30 ENCOUNTER — Telehealth: Payer: Self-pay | Admitting: *Deleted

## 2014-11-30 NOTE — Telephone Encounter (Signed)
Opened phone encounter in error 

## 2014-12-09 ENCOUNTER — Encounter: Payer: Self-pay | Admitting: Obstetrics & Gynecology

## 2014-12-09 ENCOUNTER — Other Ambulatory Visit: Payer: Self-pay | Admitting: Obstetrics & Gynecology

## 2014-12-09 ENCOUNTER — Ambulatory Visit (INDEPENDENT_AMBULATORY_CARE_PROVIDER_SITE_OTHER): Payer: Federal, State, Local not specified - PPO | Admitting: Obstetrics & Gynecology

## 2014-12-09 ENCOUNTER — Other Ambulatory Visit: Payer: Self-pay

## 2014-12-09 VITALS — BP 108/75 | HR 76 | Ht 62.0 in | Wt 119.0 lb

## 2014-12-09 DIAGNOSIS — R234 Changes in skin texture: Secondary | ICD-10-CM

## 2014-12-09 DIAGNOSIS — Z9882 Breast implant status: Secondary | ICD-10-CM

## 2014-12-09 DIAGNOSIS — Z1151 Encounter for screening for human papillomavirus (HPV): Secondary | ICD-10-CM | POA: Diagnosis not present

## 2014-12-09 DIAGNOSIS — Z01419 Encounter for gynecological examination (general) (routine) without abnormal findings: Secondary | ICD-10-CM

## 2014-12-09 DIAGNOSIS — Z9189 Other specified personal risk factors, not elsewhere classified: Secondary | ICD-10-CM

## 2014-12-09 DIAGNOSIS — Z124 Encounter for screening for malignant neoplasm of cervix: Secondary | ICD-10-CM | POA: Diagnosis not present

## 2014-12-09 DIAGNOSIS — Z87898 Personal history of other specified conditions: Secondary | ICD-10-CM

## 2014-12-09 NOTE — Progress Notes (Signed)
GYNECOLOGY CLINIC ANNUAL PREVENTATIVE CARE ENCOUNTER NOTE  Subjective:   Krista Perez is a 44 y.o. [phone] female here for a routine annual gynecologic exam.  Current complaints: none.  No periods for about almost a year now.   Denies abnormal vaginal bleeding, discharge, pelvic pain, problems with intercourse or other gynecologic concerns.  Of note, patient's mother was recently diagnosed with terminal cancer.  Patient was appropriately emotional about this; she is on Xanax which helps her anxiety and this is prescribed by her PCP.   Gynecologic History No LMP recorded. Patient is not currently having periods (Reason: Perimenopausal). Contraception: none Last Pap: ASCUS pap smear and negative high-risk HPV on 12/08/2013.  Last mammogram: 11/25/2013. Results were: normal  Obstetric History OB History  Gravida Para Term Preterm AB SAB TAB Ectopic Multiple Living  3 1 0 1 2 2    1     # Outcome Date GA Lbr Len/2nd Weight Sex Delivery Anes PTL Lv  3 Preterm 07/06/07 [id number]   F CS-LTranv   Y     Complications: Severe preeclampsia  2 SAB           1 SAB               Past Medical History  Diagnosis Date  . Generalized headaches     frequent  . Hx of migraines   . History of hepatitis     sounds like Hep A after contaminated food, cleared  . Hyperlipidemia     Past Surgical History  Procedure Laterality Date  . Appendectomy  1990  . Tonsillectomy  1980  . Breast enhancement surgery      Current Outpatient Prescriptions on File Prior to Visit  Medication Sig Dispense Refill  . ALPRAZolam (XANAX) 0.5 MG tablet TAKE 1 TABLET BY MOUTH EVERY NIGHT AT BEDTIME AS NEEDED. 30 tablet 0   No current facility-administered medications on file prior to visit.    No Known Allergies  Social History   Social History  . Marital Status: Married    Spouse Name: N/A  . Number of Children: N/A  . Years of Education: N/A   Occupational History  . Not on file.   Social History  Main Topics  . Smoking status: Never Smoker   . Smokeless tobacco: Never Used  . Alcohol Use: Yes     Comment: Rare  . Drug Use: No  . Sexual Activity:    Partners: Male   Other Topics Concern  . Not on file   Social History Narrative   Caffeine: rare decaf   Lives with husband and daughter (2009) and 1 dog   Occupation: currently housewife, thinking about going back to work.  Prior lived in [place], husband is [employer] agent   Activity: gym 3x/wk   Diet: cooks at home.  Good water, fruits/vegetables daily    Family History  Problem Relation Age of Onset  . Cancer Maternal Grandfather 50    lung (smoker)  . Cancer Maternal Grandmother 70    leukemia  . Coronary artery disease Neg Hx   . Stroke Neg Hx   . Diabetes Neg Hx   . Hypertension Neg Hx     The following portions of the patient's history were reviewed and updated as appropriate: allergies, current medications, past family history, past medical history, past social history, past surgical history and problem list.  Review of Systems Pertinent items noted in HPI and remainder of comprehensive ROS otherwise negative.  Objective:  BP 108/75 mmHg  Pulse 76  Ht  (1.575 m)  Wt 119 lb (53.978 kg)  BMI 21.76 kg/m2 CONSTITUTIONAL: Well-developed, well-nourished female in no acute distress.  HENT:  Normocephalic, atraumatic, External right and left ear normal. Oropharynx is clear and moist EYES: Conjunctivae and EOM are normal. Pupils are equal, round, and reactive to light. No scleral icterus.  NECK: Normal range of motion, supple, no masses.  Normal thyroid.  SKIN: Skin is warm and dry. No rash noted. Not diaphoretic. No erythema. No pallor. NEUROLGIC: Alert and oriented to person, place, and time. Normal reflexes, muscle tone coordination. No cranial nerve deficit noted. PSYCHIATRIC: Normal mood and affect. Normal behavior. Normal judgment and thought content. CARDIOVASCULAR: Normal heart rate noted, regular  rhythm RESPIRATORY: Clear to auscultation bilaterally. Effort and breath sounds normal, no problems with respiration noted. BREASTS: Symmetric in size, implants in place. No masses, skin changes, nipple drainage, or lymphadenopathy. ABDOMEN: Soft, normal bowel sounds, no distention noted.  No tenderness, rebound or guarding.  PELVIC: Normal appearing external genitalia; normal appearing vaginal mucosa and cervix.  No abnormal discharge noted.  Pap smear obtained.  Normal uterine size, no other palpable masses, no uterine or adnexal tenderness. MUSCULOSKELETAL: Normal range of motion. No tenderness.  No cyanosis, clubbing, or edema.  2+ distal pulses.   Assessment:  Annual gynecologic examination with pap smear   Plan:  Will follow up results of pap smear and manage accordingly. Diagnostic mammogram scheduled given that implants Routine preventative health maintenance measures emphasized. Empathized with patient about her mother; advised her to let us know if there is anyway we could be of support to her at this time.  She will continue to follow up with her PCP. Please refer to After Visit Summary for other counseling recommendations.    Jaynie Collins, MD, FACOG Attending Obstetrician & Gynecologist, [facility] for [facility]

## 2014-12-14 LAB — CYTOLOGY - PAP

## 2014-12-16 ENCOUNTER — Other Ambulatory Visit: Payer: Self-pay | Admitting: Obstetrics & Gynecology

## 2014-12-16 ENCOUNTER — Ambulatory Visit
Admission: RE | Admit: 2014-12-16 | Discharge: 2014-12-16 | Disposition: A | Discharge status: Home / Self Care | Payer: Federal, State, Local not specified - PPO | Source: Ambulatory Visit | Attending: Obstetrics & Gynecology | Admitting: Obstetrics & Gynecology

## 2014-12-16 DIAGNOSIS — Z9882 Breast implant status: Secondary | ICD-10-CM

## 2014-12-16 DIAGNOSIS — R234 Changes in skin texture: Secondary | ICD-10-CM

## 2015-01-06 ENCOUNTER — Other Ambulatory Visit: Payer: Self-pay | Admitting: Family Medicine

## 2015-01-06 NOTE — Telephone Encounter (Signed)
Ok to refill 

## 2015-01-07 NOTE — Telephone Encounter (Signed)
plz phoen in. 

## 2015-01-07 NOTE — Telephone Encounter (Signed)
Rx called in as directed.   

## 2015-01-08 ENCOUNTER — Ambulatory Visit: Payer: Federal, State, Local not specified - PPO

## 2015-01-26 ENCOUNTER — Emergency Department: Payer: Federal, State, Local not specified - PPO

## 2015-01-26 ENCOUNTER — Emergency Department
Admission: EM | Admit: 2015-01-26 | Discharge: 2015-01-26 | Disposition: A | Discharge status: Home / Self Care | Payer: Federal, State, Local not specified - PPO | Attending: Emergency Medicine | Admitting: Emergency Medicine

## 2015-01-26 ENCOUNTER — Encounter: Payer: Self-pay | Admitting: Emergency Medicine

## 2015-01-26 DIAGNOSIS — F410 Panic disorder [episodic paroxysmal anxiety] without agoraphobia: Secondary | ICD-10-CM

## 2015-01-26 DIAGNOSIS — Z79899 Other long term (current) drug therapy: Secondary | ICD-10-CM | POA: Insufficient documentation

## 2015-01-26 DIAGNOSIS — F430 Acute stress reaction: Secondary | ICD-10-CM | POA: Diagnosis not present

## 2015-01-26 DIAGNOSIS — K2970 Gastritis, unspecified, without bleeding: Secondary | ICD-10-CM | POA: Diagnosis not present

## 2015-01-26 DIAGNOSIS — R079 Chest pain, unspecified: Secondary | ICD-10-CM | POA: Diagnosis present

## 2015-01-26 LAB — BASIC METABOLIC PANEL
Anion gap: 9 (ref 5–15)
BUN: 14 mg/dL (ref 6–20)
CHLORIDE: 103 mmol/L (ref 101–111)
CO2: 27 mmol/L (ref 22–32)
Calcium: 10.7 mg/dL — ABNORMAL HIGH (ref 8.9–10.3)
Creatinine, Ser: 0.53 mg/dL (ref 0.44–1.00)
GFR calc Af Amer: >60 mL/min (ref >60)
GLUCOSE: 112 mg/dL — AB (ref 65–99)
POTASSIUM: 3.5 mmol/L (ref 3.5–5.1)
Sodium: 139 mmol/L (ref 135–145)

## 2015-01-26 LAB — CBC
HEMATOCRIT: 43 % (ref 35.0–47.0)
Hemoglobin: 14.3 g/dL (ref 12.0–16.0)
MCH: 26.9 pg (ref 26.0–34.0)
MCHC: 33.2 g/dL (ref 32.0–36.0)
MCV: 81.2 fL (ref 80.0–100.0)
Platelets: 255 10*3/uL (ref 150–440)
RBC: 5.29 MIL/uL — ABNORMAL HIGH (ref 3.80–5.20)
RDW: 13.7 % (ref 11.5–14.5)
WBC: 6.2 10*3/uL (ref 3.6–11.0)

## 2015-01-26 MED ORDER — RANITIDINE HCL 150 MG PO CAPS: 150.0000 mg | ORAL_CAPSULE | Freq: Two times a day (BID) | ORAL | Status: DC

## 2015-01-26 MED ORDER — GI COCKTAIL ~~LOC~~
30.0000 mL | ORAL | Status: AC
  Administered 2015-01-26: 30 mL via ORAL
  Filled 2015-01-26: qty 30

## 2015-01-26 MED ORDER — FAMOTIDINE 20 MG PO TABS
40.0000 mg | ORAL_TABLET | Freq: Once | ORAL | Status: AC
  Administered 2015-01-26: 40 mg via ORAL
  Filled 2015-01-26: qty 2

## 2015-01-26 MED ORDER — SUCRALFATE 1 G PO TABS: 1.0000 g | ORAL_TABLET | Freq: Four times a day (QID) | ORAL | Status: DC

## 2015-01-26 NOTE — ED Provider Notes (Signed)
[facility] Emergency Department Provider Note  ____________________________________________  Time seen: 9:30 AM  I have reviewed the triage vital signs and the nursing notes.   HISTORY  Chief Complaint Chest Pain and Anxiety    HPI Krista Perez is a 45 y.o. female who complains of chest pain over the last 24 hours. She's had about 3 episodes of fleeting pain that lasts for only a second or less with a brief paying of sharp pain in the center of the chest and left chest. Nonradiating, no shortness of breath diaphoresis vomiting dizziness or syncope. She does note that she feels very anxious recently owing to her recent cancer diagnosis with her mom. She is prescribed Xanax twice a day but has been only taking it once a day and a half tablet at time. She also reports being unhappy with some previous breast implant surgery that she wants to have reversed.  No recent travel trauma hospitalization or surgery. No history of DVT or PE   Past Medical History  Diagnosis Date  . Generalized headaches     frequent  . Hx of migraines   . History of hepatitis     sounds like Hep A after contaminated food, cleared  . Hyperlipidemia      Patient Active Problem List   Diagnosis Date Noted  . Health maintenance examination 02/05/2014  . Palpitations 02/05/2014  . Pap smear abnormality of cervix with ASCUS favoring benign, negative HRHPV on 12/08/13 12/08/2013  . Conjunctivitis 03/29/2013  . Amenorrhea 12/17/2012  . HLD (hyperlipidemia) 10/07/2012  . Right-sided headache 09/11/2011  . Adjustment disorder 06/21/2011  . History of hepatitis   . Hx of migraines      Past Surgical History  Procedure Laterality Date  . Appendectomy  1990  . Tonsillectomy  1980  . Breast enhancement surgery       Current Outpatient Rx  Name  Route  Sig  Dispense  Refill  . ALPRAZolam (XANAX) 0.5 MG tablet      TAKE 1 TABLET BY MOUTH EVERY NIGHT AT BEDTIME AS  NEEDED. Patient taking differently: 0.25 MG BY MOUTH EVERY NIGHT AT BEDTIME   30 tablet   0   . ranitidine (ZANTAC) 150 MG capsule   Oral   Take 1 capsule (150 mg total) by mouth 2 (two) times daily.   28 capsule   0   . sucralfate (CARAFATE) 1 g tablet   Oral   Take 1 tablet (1 g total) by mouth 4 (four) times daily.   120 tablet   1      Allergies Review of patient's allergies indicates no known allergies.   Family History  Problem Relation Age of Onset  . Cancer Maternal Grandfather 50    lung (smoker)  . Cancer Maternal Grandmother 70    leukemia  . Coronary artery disease Neg Hx   . Stroke Neg Hx   . Diabetes Neg Hx   . Hypertension Neg Hx     Social History Social History  Substance Use Topics  . Smoking status: Never Smoker   . Smokeless tobacco: Never Used  . Alcohol Use: Yes     Comment: Rare    Review of Systems  Constitutional:   No fever or chills. No weight changes Eyes:   No blurry vision or double vision.  ENT:   No sore throat. Cardiovascular:   Positive as above chest pain. Respiratory:   No dyspnea or cough. Gastrointestinal:   Negative for abdominal  pain, vomiting and diarrhea.  No BRBPR or melena. Genitourinary:   Negative for dysuria, urinary retention, bloody urine, or difficulty urinating. Musculoskeletal:   Negative for back pain. No joint swelling or pain. Skin:   Negative for rash. Neurological:   Negative for headaches, focal weakness or numbness. Psychiatric:  Positive anxiety.   Endocrine:  No hot/cold intolerance, changes in energy, or sleep difficulty.  10-point ROS otherwise negative.  ____________________________________________   PHYSICAL EXAM:  VITAL SIGNS: ED Triage Vitals  Enc Vitals Group     BP 01/26/15 0908 140/89 mmHg     Pulse Rate 01/26/15 0908 102     Resp 01/26/15 0908 20     Temp 01/26/15 0908 97.8 F (36.6 C)     Temp Source 01/26/15 0908 Oral     SpO2 01/26/15 0908 100 %     Weight 01/26/15  0908 120 lb (54.432 kg)     Height 01/26/15 0908 5\' 2"  (1.575 m)     Head Cir --      Peak Flow --      Pain Score 01/26/15 0915 2     Pain Loc --      Pain Edu? --      Excl. in GC? --     Vital signs reviewed, nursing assessments reviewed.   Constitutional:   Alert and oriented. Well appearing and in no distress. Eyes:   No scleral icterus. No conjunctival pallor. PERRL. EOMI ENT   Head:   Normocephalic and atraumatic.   Nose:   No congestion/rhinnorhea. No septal hematoma   Mouth/Throat:   MMM, no pharyngeal erythema. No peritonsillar mass. No uvula shift.   Neck:   No stridor. No SubQ emphysema. No meningismus. Hematological/Lymphatic/Immunilogical:   No cervical lymphadenopathy. Cardiovascular:   RRR. Normal and symmetric distal pulses are present in all extremities. No murmurs, rubs, or gallops. Respiratory:   Normal respiratory effort without tachypnea nor retractions. Breath sounds are clear and equal bilaterally. No wheezes/rales/rhonchi. Gastrointestinal:   Soft with mild epigastric tenderness. No distention. There is no CVA tenderness.  No rebound, rigidity, or guarding. Genitourinary:   deferred Musculoskeletal:   Nontender with normal range of motion in all extremities. No joint effusions.  No lower extremity tenderness.  No edema. Chest wall nontender  Neurologic:   Normal speech and language.  CN 2-10 normal. Motor grossly intact. No pronator drift.  Normal gait. No gross focal neurologic deficits are appreciated.  Skin:    Skin is warm, dry and intact. No rash noted.  No petechiae, purpura, or bullae. Psychiatric:   Anxious. Speech and behavior are normal. Patient exhibits appropriate insight and judgment.  ____________________________________________    LABS (pertinent positives/negatives) (all labs ordered are listed, but only abnormal results are displayed) Labs Reviewed  BASIC METABOLIC PANEL - Abnormal; Notable for the following:    Glucose,  Bld 112 (*)    Calcium 10.7 (*)    All other components within normal limits  CBC - Abnormal; Notable for the following:    RBC 5.29 (*)    All other components within normal limits   ____________________________________________   EKG  Interpreted by me Sinus tachycardia rate 108, normal axis intervals QRS and ST segments and T waves. No evidence of right heart strain  ____________________________________________    RADIOLOGY  Chest x-ray unremarkable  ____________________________________________   PROCEDURES   ____________________________________________   INITIAL IMPRESSION / ASSESSMENT AND PLAN / ED COURSE  Pertinent labs & imaging results that were available during  my care of the patient were reviewed by me and considered in my medical decision making (see chart for details).  Patient well appearing no acute distress. Symptoms are not concerning for ACS PE TAD pneumothorax carditis mediastinitis pneumonia or sepsis. No evidence of any soft tissue infection or trauma. History of present illness highly suggestive of anxiety and panic attacks in the setting of recent stressors and her trying to avoid benzodiazepine use. With mild epigastric tenderness we'll also do a trial of antacids and have her follow-up with her primary care doctor. Very well-appearing no acute distress suitable for outpatient follow-up.     ____________________________________________   FINAL CLINICAL IMPRESSION(S) / ED DIAGNOSES  Final diagnoses:  Gastritis  Panic attack as reaction to stress      Sharman CheekPhillip Sharnay Cashion, MD 01/26/15 1036

## 2015-01-26 NOTE — ED Notes (Signed)
Pt states she had sharp shooting cp that began last night in left chest area. Tearful in triage, states she is under a lot of stress.

## 2015-01-26 NOTE — Discharge Instructions (Signed)
Panic Attacks °Panic attacks are sudden, short-lived surges of severe anxiety, fear, or discomfort. They may occur for no reason when you are relaxed, when you are anxious, or when you are sleeping. Panic attacks may occur for a number of reasons:  °· Healthy people occasionally have panic attacks in extreme, life-threatening situations, such as war or natural disasters. Normal anxiety is a protective mechanism of the body that helps us react to danger (fight or flight response). °· Panic attacks are often seen with anxiety disorders, such as panic disorder, social anxiety disorder, generalized anxiety disorder, and phobias. Anxiety disorders cause excessive or uncontrollable anxiety. They may interfere with your relationships or other life activities. °· Panic attacks are sometimes seen with other mental illnesses, such as depression and posttraumatic stress disorder. °· Certain medical conditions, prescription medicines, and drugs of abuse can cause panic attacks. °SYMPTOMS  °Panic attacks start suddenly, peak within 20 minutes, and are accompanied by four or more of the following symptoms: °· Pounding heart or fast heart rate (palpitations). °· Sweating. °· Trembling or shaking. °· Shortness of breath or feeling smothered. °· Feeling choked. °· Chest pain or discomfort. °· Nausea or strange feeling in your stomach. °· Dizziness, light-headedness, or feeling like you will faint. °· Chills or hot flushes. °· Numbness or tingling in your lips or hands and feet. °· Feeling that things are not real or feeling that you are not yourself. °· Fear of losing control or going crazy. °· Fear of dying. °Some of these symptoms can mimic serious medical conditions. For example, you may think you are having a heart attack. Although panic attacks can be very scary, they are not life threatening. °DIAGNOSIS  °Panic attacks are diagnosed through an assessment by your health care provider. Your health care provider will ask  questions about your symptoms, such as where and when they occurred. Your health care provider will also ask about your medical history and use of alcohol and drugs, including prescription medicines. Your health care provider may order blood tests or other studies to rule out a serious medical condition. Your health care provider may refer you to a mental health professional for further evaluation. °TREATMENT  °· Most healthy people who have one or two panic attacks in an extreme, life-threatening situation will not require treatment. °· The treatment for panic attacks associated with anxiety disorders or other mental illness typically involves counseling with a mental health professional, medicine, or a combination of both. Your health care provider will help determine what treatment is best for you. °· Panic attacks due to physical illness usually go away with treatment of the illness. If prescription medicine is causing panic attacks, talk with your health care provider about stopping the medicine, decreasing the dose, or substituting another medicine. °· Panic attacks due to alcohol or drug abuse go away with abstinence. Some adults need professional help in order to stop drinking or using drugs. °HOME CARE INSTRUCTIONS  °· Take all medicines as directed by your health care provider.   °· Schedule and attend follow-up visits as directed by your health care provider. It is important to keep all your appointments. °SEEK MEDICAL CARE IF: °· You are not able to take your medicines as prescribed. °· Your symptoms do not improve or get worse. °SEEK IMMEDIATE MEDICAL CARE IF:  °· You experience panic attack symptoms that are different than your usual symptoms. °· You have serious thoughts about hurting yourself or others. °· You are taking medicine for panic attacks and   have a serious side effect. MAKE SURE YOU:  Understand these instructions.  Will watch your condition.  Will get help right away if you are not  doing well or get worse.   This information is not intended to replace advice given to you by your health care provider. Make sure you discuss any questions you have with your health care provider.   Document Released: 01/09/2005 Document Revised: 01/14/2013 Document Reviewed: 08/23/2012 Elsevier Interactive Patient Education 2016 [employer] Inc[employer]  Gastritis, Adult Gastritis is soreness and swelling (inflammation) of the lining of the stomach. Gastritis can develop as a sudden onset (acute) or long-term (chronic) condition. If gastritis is not treated, it can lead to stomach bleeding and ulcers. CAUSES  Gastritis occurs when the stomach lining is weak or damaged. Digestive juices from the stomach then inflame the weakened stomach lining. The stomach lining may be weak or damaged due to viral or bacterial infections. One common bacterial infection is the Helicobacter pylori infection. Gastritis can also result from excessive alcohol consumption, taking certain medicines, or having too much acid in the stomach.  SYMPTOMS  In some cases, there are no symptoms. When symptoms are present, they may include:  Pain or a burning sensation in the upper abdomen.  Nausea.  Vomiting.  An uncomfortable feeling of fullness after eating. DIAGNOSIS  Your caregiver may suspect you have gastritis based on your symptoms and a physical exam. To determine the cause of your gastritis, your caregiver may perform the following:  Blood or stool tests to check for the H pylori bacterium.  Gastroscopy. A thin, flexible tube (endoscope) is passed down the esophagus and into the stomach. The endoscope has a light and camera on the end. Your caregiver uses the endoscope to view the inside of the stomach.  Taking a tissue sample (biopsy) from the stomach to examine under a microscope. TREATMENT  Depending on the cause of your gastritis, medicines may be prescribed. If you have a bacterial infection, such as an H pylori  infection, antibiotics may be given. If your gastritis is caused by too much acid in the stomach, H2 blockers or antacids may be given. Your caregiver may recommend that you stop taking aspirin, ibuprofen, or other nonsteroidal anti-inflammatory drugs (NSAIDs). HOME CARE INSTRUCTIONS  Only take over-the-counter or prescription medicines as directed by your caregiver.  If you were given antibiotic medicines, take them as directed. Finish them even if you start to feel better.  Drink enough fluids to keep your urine clear or pale yellow.  Avoid foods and drinks that make your symptoms worse, such as:  Caffeine or alcoholic drinks.  Chocolate.  Peppermint or mint flavorings.  Garlic and onions.  Spicy foods.  Citrus fruits, such as oranges, lemons, or limes.  Tomato-based foods such as sauce, chili, salsa, and pizza.  Fried and fatty foods.  Eat small, frequent meals instead of large meals. SEEK IMMEDIATE MEDICAL CARE IF:   You have black or dark red stools.  You vomit blood or material that looks like coffee grounds.  You are unable to keep fluids down.  Your abdominal pain gets worse.  You have a fever.  You do not feel better after 1 week.  You have any other questions or concerns. MAKE SURE YOU:  Understand these instructions.  Will watch your condition.  Will get help right away if you are not doing well or get worse.   This information is not intended to replace advice given to you by your health  care provider. Make sure you discuss any questions you have with your health care provider.   Document Released: 01/03/2001 Document Revised: 07/11/2011 Document Reviewed: 02/22/2011 Elsevier Interactive Patient Education [employer]

## 2015-01-27 NOTE — Telephone Encounter (Signed)
Pt did not get rozerem in June 2016 due to ins not covering; pt wants different med for sleep that ins will cover; advised pt to contact ins co to see what substitute med is covered and cb with information. Pt voiced understanding.

## 2015-01-28 ENCOUNTER — Telehealth: Payer: Self-pay

## 2015-01-28 MED ORDER — DOXEPIN HCL 3 MG PO TABS: 3.0000 mg | ORAL_TABLET | Freq: Every evening | ORAL | Status: DC | PRN

## 2015-01-28 NOTE — Telephone Encounter (Signed)
Recommend silenor for sleep maintenance insomnia. Sent in. I'm sorry about stress she's going through with mother's cancer.

## 2015-01-28 NOTE — Telephone Encounter (Signed)
Pt left v/m; Rozerem given in 06/2014 was too expensive and pt did not get med. Pt contacted ins co and preferred meds are zolpidem, edluar,silenor and zaleplon.  Pt having problem sleeping; pt takes 1/2 pill of xanax and that helps pt go to sleep but pt wakes up 2-3 times during the night.pt is very stressed over mothers sickness.pt has not had menstrual period in one year; hot flashes are more often. Last annual 02/05/14. Pt scheduled for CPX 02/11/15. [place].

## 2015-02-03 ENCOUNTER — Ambulatory Visit (INDEPENDENT_AMBULATORY_CARE_PROVIDER_SITE_OTHER): Payer: Federal, State, Local not specified - PPO | Admitting: Nurse Practitioner

## 2015-02-03 ENCOUNTER — Ambulatory Visit: Payer: Federal, State, Local not specified - PPO | Admitting: Nurse Practitioner

## 2015-02-03 ENCOUNTER — Encounter: Payer: Self-pay | Admitting: Nurse Practitioner

## 2015-02-03 VITALS — BP 96/74 | HR 84 | Ht 62.0 in | Wt 123.0 lb

## 2015-02-03 DIAGNOSIS — R072 Precordial pain: Secondary | ICD-10-CM | POA: Diagnosis not present

## 2015-02-03 DIAGNOSIS — E785 Hyperlipidemia, unspecified: Secondary | ICD-10-CM | POA: Diagnosis not present

## 2015-02-03 DIAGNOSIS — R002 Palpitations: Secondary | ICD-10-CM

## 2015-02-03 NOTE — Patient Instructions (Signed)
Medication Instructions:  Your physician recommends that you continue on your current medications as directed. Please refer to the Current Medication list given to you today.   Labwork: none  Testing/Procedures: Your physician has requested that you have an exercise tolerance test. For further information please visit [email or web address]. Please also follow instruction sheet, as given.    Follow-Up: Your physician recommends that you schedule a follow-up appointment as needed.    Any Other Special Instructions Will Be Listed Below (If Applicable).     If you need a refill on your cardiac medications before your next appointment, please call your pharmacy.  Exercise Stress Electrocardiogram An exercise stress electrocardiogram is a test that is done to evaluate the blood supply to your heart. This test may also be called exercise stress electrocardiography. The test is done while you are walking on a treadmill. The goal of this test is to raise your heart rate. This test is done to find areas of poor blood flow to the heart by determining the extent of coronary artery disease (CAD).   CAD is defined as narrowing in one or more heart (coronary) arteries of more than 70%. If you have an abnormal test result, this may mean that you are not getting adequate blood flow to your heart during exercise. Additional testing may be needed to understand why your test was abnormal. LET YOUR HEALTH CARE PROVIDER KNOW ABOUT:   Any allergies you have.  All medicines you are taking, including vitamins, herbs, eye drops, creams, and over-the-counter medicines.  Previous problems you or members of your family have had with the use of anesthetics.  Any blood disorders you have.  Previous surgeries you have had.  Medical conditions you have.  Possibility of pregnancy, if this applies. RISKS AND COMPLICATIONS Generally, this is a safe procedure. However, as with any procedure, complications can  occur. Possible complications can include:  Pain or pressure in the following areas:  Chest.  Jaw or neck.  Between your shoulder blades.  Radiating down your left arm.  Dizziness or light-headedness.  Shortness of breath.  Increased or irregular heartbeats.  Nausea or vomiting.  Heart attack (rare). BEFORE THE PROCEDURE  Avoid all forms of caffeine 24 hours before your test or as directed by your health care provider. This includes coffee, tea (even decaffeinated tea), caffeinated sodas, chocolate, cocoa, and certain pain medicines.  Follow your health care provider's instructions regarding eating and drinking before the test.  Take your medicines as directed at regular times with water unless instructed otherwise. Exceptions may include:  If you have diabetes, ask how you are to take your insulin or pills. It is common to adjust insulin dosing the morning of the test.  If you are taking beta-blocker medicines, it is important to talk to your health care provider about these medicines well before the date of your test. Taking beta-blocker medicines may interfere with the test. In some cases, these medicines need to be changed or stopped 24 hours or more before the test.  If you wear a nitroglycerin patch, it may need to be removed prior to the test. Ask your health care provider if the patch should be removed before the test.  If you use an inhaler for any breathing condition, bring it with you to the test.  If you are an outpatient, bring a snack so you can eat right after the stress phase of the test.  Do not smoke for 4 hours prior to the test or as   directed by your health care provider.  Do not apply lotions, powders, creams, or oils on your chest prior to the test.  Wear loose-fitting clothes and comfortable shoes for the test. This test involves walking on a treadmill. PROCEDURE  Multiple patches (electrodes) will be put on your chest. If needed, small areas of  your chest may have to be shaved to get better contact with the electrodes. Once the electrodes are attached to your body, multiple wires will be attached to the electrodes and your heart rate will be monitored.  Your heart will be monitored both at rest and while exercising.  You will walk on a treadmill. The treadmill will be started at a slow pace. The treadmill speed and incline will gradually be increased to raise your heart rate. AFTER THE PROCEDURE  Your heart rate and blood pressure will be monitored after the test.  You may return to your normal schedule including diet, activities, and medicines, unless your health care provider tells you otherwise.   This information is not intended to replace advice given to you by your health care provider. Make sure you discuss any questions you have with your health care provider.   Document Released: 01/07/2000 Document Revised: 01/14/2013 Document Reviewed: 09/16/2012 Elsevier Interactive Patient Education 2016 Elsevier Inc.  

## 2015-02-03 NOTE — Progress Notes (Signed)
Cardiology Clinic Note   Patient Name: Krista Perez Date of Encounter: 02/03/2015  Primary Care Provider:  Eustaquio BoydenJavier Gutierrez, MD Primary Cardiologist:  New - will f/u with Krista Se. Gollan, MD   Patient Profile    45 year old female with a seven-month history of intermittent sharp chest pain who presents for cardiology evaluation.  Past Medical History    Past Medical History  Diagnosis Date  . Generalized headaches     frequent  . Hx of migraines   . History of hepatitis     a. sounds like Hep A after contaminated food, cleared  . Hyperlipidemia     a. 01/2014 TC 215, TG 139, HDL 44, LDL 143.  Marland Kitchen. Palpitations   . Chest pain     a. 06/2014 - eval in [place] for sharp c/p;  b. 01/2015 ED vist, neg trop.   Past Surgical History  Procedure Laterality Date  . Appendectomy  1990  . Tonsillectomy  1980  . Breast enhancement surgery      Allergies  No Known Allergies  History of Present Illness    45 year old female who does not have a prior cardiac history. She does have a remote history of palpitations and also hyperlipidemia, though she has not been previously felt to require statin therapy. She has no history of hypertension, diabetes, stroke, tobacco abuse, or family history of premature coronary artery disease. 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    She was in her usual state of health until June of this year, when she was visiting her [place] in [place] and her mother was diagnosed with metastatic pancreatic cancer. This was a devastating blow to Krista Perez and 2 days later, she developed sharp, fleeting chest pain for which she sought evaluation at a hospital in [place]. She was seen in the emergency department and apparently had a normal EKG and x-ray and was prescribed Xanax and discharged home. About 2 weeks later, while still in [place], she developed right-sided facial  numbness and right arm numbness without chest pain. She was again seen in the emergency room and subsequently admitted. Her evaluation was apparently negative. She was seen by cardiology with recommendation for outpatient stress testing once she returned to [place]. Over the past 6 months, she has experienced intermittent sharp and fleeting left chest pain without associated symptoms, occurring about 2-3 times per week, lasting just a second or 2, and resolving spontaneously. The pain does not typically limit her activities and is not reproducible with palpation, deep breathing, coughing, or position changes. She says that sometimes she has soreness above her left breast after an episode of sharp chest pain and she believes that the pain may be secondary to her previous breast implant surgery. On January 3, patient was at work and had 2 recurrent episodes of sharp and fleeting chest pain, prompting her to schedule this appointment today and also to present to the emergency department for evaluation. There, ECG and chest x-ray were normal. A troponin was not evaluated. She was diagnosed with GERD and given a prescription for Zantac and Carafate. She has been taking the Zantac and has not had recurrence of chest pain. She denies any history of PND, orthopnea, dizziness, syncope, edema, or early satiety.  Home Medications    Prior to Admission medications   Medication Sig Start Date End Date Taking? Authorizing Provider  ALPRAZolam Prudy Feeler(XANAX) 0.5 MG  tablet TAKE 1 TABLET BY MOUTH EVERY NIGHT AT BEDTIME AS NEEDED. Patient taking differently: 0.25 MG BY MOUTH EVERY NIGHT AT BEDTIME 01/07/15  Yes Krista Boyden, MD  ranitidine (ZANTAC) 150 MG capsule Take 1 capsule (150 mg total) by mouth 2 (two) times daily. 01/26/15  Yes Krista Cheek, MD  sucralfate (CARAFATE) 1 g tablet Take 1 tablet (1 g total) by mouth 4 (four) times daily. Patient not taking: Reported on 02/03/2015 01/26/15   Krista Cheek, MD    Family History    Family History  Problem Relation Age of Onset  . Cancer Maternal Grandfather 50    lung (smoker)  . Cancer Maternal Grandmother 70    leukemia  . Coronary artery disease Neg Hx   . Stroke Neg Hx   . Diabetes Neg Hx   . Hypertension Neg Hx   . Pancreatic cancer Mother     Terminal cancer dx in her 66's - still alive, lives in [place].  . Other Father     alive and well, 25, lives in [place], [place]  . Other Brother     alive and well, lives in [place], [place].    Social History    Social History   Social History  . Marital Status: Married    Spouse Name: N/A  . Number of Children: N/A  . Years of Education: N/A   Occupational History  . Not on file.   Social History Main Topics  . Smoking status: Never Smoker   . Smokeless tobacco: Never Used  . Alcohol Use: Yes     Comment: Rare  . Drug Use: No  . Sexual Activity:    Partners: Male   Other Topics Concern  . Not on file   Social History Narrative   Caffeine: rare decaf   Lives with husband and daughter (2009) and 1 dog   Occupation: Works as Ecologist.  Prior lived in [place], husband is [employer] agent   Activity: gym 3x/wk prior to 06/2014 - much less active now.   Diet: cooks at home.  Good water, fruits/vegetables daily     Review of Systems    General:  No chills, fever, night sweats or weight changes.  Cardiovascular:  +++ chest pain as outlined above, +++ dyspnea with higher levels of exertion, no edema, orthopnea, palpitations, paroxysmal nocturnal dyspnea. Dermatological: No rash, lesions/masses Respiratory: No cough, +++ dyspnea with higher levels of exertion. Urologic: No hematuria, dysuria Abdominal:   No nausea, vomiting, diarrhea, bright red blood per rectum, melena, or hematemesis Neurologic:  No visual changes, wkns, changes in mental status. All other systems reviewed and are otherwise negative except as noted above.  Physical Exam    VS:  BP 96/74 mmHg   Pulse 84  Ht 5\' 2"  (1.575 m)  Wt 123 lb (55.792 kg)  BMI 22.49 kg/m2 , BMI Body mass index is 22.49 kg/(m^2). GEN: Well nourished, well developed, in no acute distress. HEENT: normal. Neck: Supple, no JVD, carotid bruits, or masses. Cardiac: RRR, no murmurs, rubs, or gallops. No clubbing, cyanosis, edema.  Radials/DP/PT 2+ and equal bilaterally.  Respiratory:  Respirations regular and unlabored, clear to auscultation bilaterally. GI: Soft, nontender, nondistended, BS + x 4. MS: no deformity or atrophy. Skin: warm and dry, no rash. Neuro:  Strength and sensation are intact. Psych: Normal affect.  Accessory Clinical Findings    ECG - Sinus Arrhythmia, 84, no acute st/t changes.  Assessment & Plan   1.  Precordial chest  pain: Patient has a 6-7 month history of intermittent sharp and fleeting chest discomfort without associated symptoms, lasting 1-2 seconds and resolving spontaneously. She has had 3 emergency room evaluations since June 2016, each of which failing to show any evidence of ischemia. Her only real risk factor for coronary artery disease is hyperlipidemia as she has no prior history of hypertension, diabetes, stroke, tobacco abuse, or family history of CAD. Given her low risk status with atypical symptoms, I will arrange for exercise treadmill test to take place in our office within the next week or 2 in order to further rule out ischemia and hopefully provide reassurance. If testing is abnormal, we will need to consider a more invasive evaluation.  2. Hyperlipidemia: Followed by primary care.  She is due to have repeat labs in a few days.  Total cholesterol was 215 in January 2016 with an LDL of 143. She has a 2% risk of a cardiac event over the next 10 years and thus does not require statin therapy at this time.  3.  Diposition:  ETT as above.  F/U wit Dr. Mariah Milling PRN pending stress testing result.   Nicolasa Ducking, NP 02/03/2015, 12:29 PM

## 2015-02-04 ENCOUNTER — Other Ambulatory Visit: Payer: Self-pay | Admitting: Family Medicine

## 2015-02-04 ENCOUNTER — Other Ambulatory Visit: Payer: Federal, State, Local not specified - PPO

## 2015-02-04 DIAGNOSIS — E785 Hyperlipidemia, unspecified: Secondary | ICD-10-CM

## 2015-02-05 ENCOUNTER — Other Ambulatory Visit: Payer: Federal, State, Local not specified - PPO

## 2015-02-08 ENCOUNTER — Other Ambulatory Visit: Payer: Federal, State, Local not specified - PPO

## 2015-02-09 ENCOUNTER — Encounter: Payer: Self-pay | Admitting: Family Medicine

## 2015-02-09 ENCOUNTER — Other Ambulatory Visit: Payer: Self-pay | Admitting: Family Medicine

## 2015-02-09 ENCOUNTER — Other Ambulatory Visit (INDEPENDENT_AMBULATORY_CARE_PROVIDER_SITE_OTHER): Payer: Federal, State, Local not specified - PPO

## 2015-02-09 DIAGNOSIS — Z8619 Personal history of other infectious and parasitic diseases: Secondary | ICD-10-CM | POA: Diagnosis not present

## 2015-02-09 DIAGNOSIS — E785 Hyperlipidemia, unspecified: Secondary | ICD-10-CM

## 2015-02-09 LAB — COMPREHENSIVE METABOLIC PANEL
ALK PHOS: 56 U/L (ref 39–117)
ALT: 11 U/L (ref 0–35)
AST: 16 U/L (ref 0–37)
Albumin: 4.4 g/dL (ref 3.5–5.2)
BILIRUBIN TOTAL: 0.4 mg/dL (ref 0.2–1.2)
BUN: 10 mg/dL (ref 6–23)
CALCIUM: 9.4 mg/dL (ref 8.4–10.5)
CO2: 29 meq/L (ref 19–32)
CREATININE: 0.55 mg/dL (ref 0.40–1.20)
Chloride: 103 mEq/L (ref 96–112)
GFR: 126.58 mL/min (ref >60.00)
GLUCOSE: 90 mg/dL (ref 70–99)
Potassium: 3.8 mEq/L (ref 3.5–5.1)
Sodium: 139 mEq/L (ref 135–145)
TOTAL PROTEIN: 7.8 g/dL (ref 6.0–8.3)

## 2015-02-09 LAB — LIPID PANEL
CHOLESTEROL: 219 mg/dL — AB (ref 0–200)
HDL: 47.4 mg/dL (ref >39.00)
LDL Cholesterol: 145 mg/dL — ABNORMAL HIGH (ref 0–99)
NONHDL: 171.61
TRIGLYCERIDES: 132 mg/dL (ref 0.0–149.0)
Total CHOL/HDL Ratio: 5
VLDL: 26.4 mg/dL (ref 0.0–40.0)

## 2015-02-09 LAB — TSH: TSH: 1.68 u[IU]/mL (ref 0.35–4.50)

## 2015-02-09 NOTE — Addendum Note (Signed)
Addended by: Alvina Chou on: 02/09/2015 02:33 PM   Modules accepted: Orders

## 2015-02-10 LAB — HEPATITIS C ANTIBODY: HCV Ab: NEGATIVE

## 2015-02-10 LAB — HEPATITIS A ANTIBODY, TOTAL: HEP A TOTAL AB: REACTIVE — AB

## 2015-02-11 ENCOUNTER — Encounter: Payer: Federal, State, Local not specified - PPO | Admitting: Family Medicine

## 2015-02-16 ENCOUNTER — Ambulatory Visit (INDEPENDENT_AMBULATORY_CARE_PROVIDER_SITE_OTHER): Payer: Federal, State, Local not specified - PPO | Admitting: Family Medicine

## 2015-02-16 ENCOUNTER — Encounter: Payer: Self-pay | Admitting: Family Medicine

## 2015-02-16 ENCOUNTER — Encounter: Payer: Self-pay | Admitting: *Deleted

## 2015-02-16 VITALS — BP 108/70 | HR 80 | Temp 98.1°F | Wt 121.5 lb

## 2015-02-16 DIAGNOSIS — F4329 Adjustment disorder with other symptoms: Secondary | ICD-10-CM | POA: Diagnosis not present

## 2015-02-16 DIAGNOSIS — Z780 Asymptomatic menopausal state: Secondary | ICD-10-CM

## 2015-02-16 DIAGNOSIS — Z8619 Personal history of other infectious and parasitic diseases: Secondary | ICD-10-CM | POA: Diagnosis not present

## 2015-02-16 DIAGNOSIS — Z0000 Encounter for general adult medical examination without abnormal findings: Secondary | ICD-10-CM | POA: Diagnosis not present

## 2015-02-16 MED ORDER — ZOLPIDEM TARTRATE 5 MG PO TABS: 2.5000 mg | ORAL_TABLET | Freq: Every evening | ORAL | Status: DC | PRN

## 2015-02-16 NOTE — Patient Instructions (Addendum)
Look into black cohosh for hot flashes if stress test ok. Trial ambien 86m 1/2 - 1 tab for sleep. Don't take with xanax.  Update UDS.   Health Maintenance, Female Adopting a healthy lifestyle and getting preventive care can go a long way to promote health and wellness. Talk with your health care provider about what schedule of regular examinations is right for you. This is a good chance for you to check in with your provider about disease prevention and staying healthy. In between checkups, there are plenty of things you can do on your own. Experts have done a lot of research about which lifestyle changes and preventive measures are most likely to keep you healthy. Ask your health care provider for more information. WEIGHT AND DIET  Eat a healthy diet  Be sure to include plenty of vegetables, fruits, low-fat dairy products, and lean protein.  Do not eat a lot of foods high in solid fats, added sugars, or salt.  Get regular exercise. This is one of the most important things you can do for your health.  Most adults should exercise for at least 150 minutes each week. The exercise should increase your heart rate and make you sweat (moderate-intensity exercise).  Most adults should also do strengthening exercises at least twice a week. This is in addition to the moderate-intensity exercise.  Maintain a healthy weight  Body mass index (BMI) is a measurement that can be used to identify possible weight problems. It estimates body fat based on height and weight. Your health care provider can help determine your BMI and help you achieve or maintain a healthy weight.  For females 277years of age and older:   A BMI below 18.5 is considered underweight.  A BMI of 18.5 to 24.9 is normal.  A BMI of 25 to 29.9 is considered overweight.  A BMI of 30 and above is considered obese.  Watch levels of cholesterol and blood lipids  You should start having your blood tested for lipids and cholesterol  at 46years of age, then have this test every 5 years.  You may need to have your cholesterol levels checked more often if:  Your lipid or cholesterol levels are high.  You are older than 46years of age.  You are at high risk for heart disease.  CANCER SCREENING   Lung Cancer  Lung cancer screening is recommended for adults 57183years old who are at high risk for lung cancer because of a history of smoking.  A yearly low-dose CT scan of the lungs is recommended for people who:  Currently smoke.  Have quit within the past 15 years.  Have at least a 30-pack-year history of smoking. A pack year is smoking an average of one pack of cigarettes a day for 1 year.  Yearly screening should continue until it has been 15 years since you quit.  Yearly screening should stop if you develop a health problem that would prevent you from having lung cancer treatment.  Breast Cancer  Practice breast self-awareness. This means understanding how your breasts normally appear and feel.  It also means doing regular breast self-exams. Let your health care provider know about any changes, no matter how small.  If you are in your 20s or 30s, you should have a clinical breast exam (CBE) by a health care provider every 1-3 years as part of a regular health exam.  If you are 429or older, have a CBE every year. Also consider  having a breast X-ray (mammogram) every year.  If you have a family history of breast cancer, talk to your health care provider about genetic screening.  If you are at high risk for breast cancer, talk to your health care provider about having an MRI and a mammogram every year.  Breast cancer gene (BRCA) assessment is recommended for women who have family members with BRCA-related cancers. BRCA-related cancers include:  Breast.  Ovarian.  Tubal.  Peritoneal cancers.  Results of the assessment will determine the need for genetic counseling and BRCA1 and BRCA2  testing. Cervical Cancer Your health care provider may recommend that you be screened regularly for cancer of the pelvic organs (ovaries, uterus, and vagina). This screening involves a pelvic examination, including checking for microscopic changes to the surface of your cervix (Pap test). You may be encouraged to have this screening done every 3 years, beginning at age 39.  For women ages 2-65, health care providers may recommend pelvic exams and Pap testing every 3 years, or they may recommend the Pap and pelvic exam, combined with testing for human papilloma virus (HPV), every 5 years. Some types of HPV increase your risk of cervical cancer. Testing for HPV may also be done on women of any age with unclear Pap test results.  Other health care providers may not recommend any screening for nonpregnant women who are considered low risk for pelvic cancer and who do not have symptoms. Ask your health care provider if a screening pelvic exam is right for you.  If you have had past treatment for cervical cancer or a condition that could lead to cancer, you need Pap tests and screening for cancer for at least 20 years after your treatment. If Pap tests have been discontinued, your risk factors (such as having a new sexual partner) need to be reassessed to determine if screening should resume. Some women have medical problems that increase the chance of getting cervical cancer. In these cases, your health care provider may recommend more frequent screening and Pap tests. Colorectal Cancer  This type of cancer can be detected and often prevented.  Routine colorectal cancer screening usually begins at 46 years of age and continues through 46 years of age.  Your health care provider may recommend screening at an earlier age if you have risk factors for colon cancer.  Your health care provider may also recommend using home test kits to check for hidden blood in the stool.  A small camera at the end of a  tube can be used to examine your colon directly (sigmoidoscopy or colonoscopy). This is done to check for the earliest forms of colorectal cancer.  Routine screening usually begins at age 73.  Direct examination of the colon should be repeated every 5-10 years through 46 years of age. However, you may need to be screened more often if early forms of precancerous polyps or small growths are found. Skin Cancer  Check your skin from head to toe regularly.  Tell your health care provider about any new moles or changes in moles, especially if there is a change in a mole's shape or color.  Also tell your health care provider if you have a mole that is larger than the size of a pencil eraser.  Always use sunscreen. Apply sunscreen liberally and repeatedly throughout the day.  Protect yourself by wearing long sleeves, pants, a wide-brimmed hat, and sunglasses whenever you are outside. HEART DISEASE, DIABETES, AND HIGH BLOOD PRESSURE   High blood  pressure causes heart disease and increases the risk of stroke. High blood pressure is more likely to develop in:  People who have blood pressure in the high end of the normal range (130-139/85-89 mm Hg).  People who are overweight or obese.  People who are African American.  If you are 48-67 years of age, have your blood pressure checked every 3-5 years. If you are 48 years of age or older, have your blood pressure checked every year. You should have your blood pressure measured twice--once when you are at a hospital or clinic, and once when you are not at a hospital or clinic. Record the average of the two measurements. To check your blood pressure when you are not at a hospital or clinic, you can use:  An automated blood pressure machine at a pharmacy.  A home blood pressure monitor.  If you are between 85 years and 8 years old, ask your health care provider if you should take aspirin to prevent strokes.  Have regular diabetes screenings. This  involves taking a blood sample to check your fasting blood sugar level.  If you are at a normal weight and have a low risk for diabetes, have this test once every three years after 46 years of age.  If you are overweight and have a high risk for diabetes, consider being tested at a younger age or more often. PREVENTING INFECTION  Hepatitis B  If you have a higher risk for hepatitis B, you should be screened for this virus. You are considered at high risk for hepatitis B if:  You were born in a country where hepatitis B is common. Ask your health care provider which countries are considered high risk.  Your parents were born in a high-risk country, and you have not been immunized against hepatitis B (hepatitis B vaccine).  You have HIV or AIDS.  You use needles to inject street drugs.  You live with someone who has hepatitis B.  You have had sex with someone who has hepatitis B.  You get hemodialysis treatment.  You take certain medicines for conditions, including cancer, organ transplantation, and autoimmune conditions. Hepatitis C  Blood testing is recommended for:  Everyone born from [birth year] through 1966.  Anyone with known risk factors for hepatitis C. Sexually transmitted infections (STIs)  You should be screened for sexually transmitted infections (STIs) including gonorrhea and chlamydia if:  You are sexually active and are younger than 46 years of age.  You are older than 46 years of age and your health care provider tells you that you are at risk for this type of infection.  Your sexual activity has changed since you were last screened and you are at an increased risk for chlamydia or gonorrhea. Ask your health care provider if you are at risk.  If you do not have HIV, but are at risk, it may be recommended that you take a prescription medicine daily to prevent HIV infection. This is called pre-exposure prophylaxis (PrEP). You are considered at risk if:  You are  sexually active and do not regularly use condoms or know the HIV status of your partner(s).  You take drugs by injection.  You are sexually active with a partner who has HIV. Talk with your health care provider about whether you are at high risk of being infected with HIV. If you choose to begin PrEP, you should first be tested for HIV. You should then be tested every 3 months for as long as  you are taking PrEP.  PREGNANCY   If you are premenopausal and you may become pregnant, ask your health care provider about preconception counseling.  If you may become pregnant, take 400 to 800 micrograms (mcg) of folic acid every day.  If you want to prevent pregnancy, talk to your health care provider about birth control (contraception). OSTEOPOROSIS AND MENOPAUSE   Osteoporosis is a disease in which the bones lose minerals and strength with aging. This can result in serious bone fractures. Your risk for osteoporosis can be identified using a bone density scan.  If you are 21 years of age or older, or if you are at risk for osteoporosis and fractures, ask your health care provider if you should be screened.  Ask your health care provider whether you should take a calcium or vitamin D supplement to lower your risk for osteoporosis.  Menopause may have certain physical symptoms and risks.  Hormone replacement therapy may reduce some of these symptoms and risks. Talk to your health care provider about whether hormone replacement therapy is right for you.  HOME CARE INSTRUCTIONS   Schedule regular health, dental, and eye exams.  Stay current with your immunizations.   Do not use any tobacco products including cigarettes, chewing tobacco, or electronic cigarettes.  If you are pregnant, do not drink alcohol.  If you are breastfeeding, limit how much and how often you drink alcohol.  Limit alcohol intake to no more than 1 drink per day for nonpregnant women. One drink equals 12 ounces of beer, 5  ounces of wine, or 1 ounces of hard liquor.  Do not use street drugs.  Do not share needles.  Ask your health care provider for help if you need support or information about quitting drugs.  Tell your health care provider if you often feel depressed.  Tell your health care provider if you have ever been abused or do not feel safe at home.   This information is not intended to replace advice given to you by your health care provider. Make sure you discuss any questions you have with your health care provider.   Document Released: 07/25/2010 Document Revised: 01/30/2014 Document Reviewed: 12/11/2012 Elsevier Interactive Patient Education [employer].

## 2015-02-16 NOTE — Progress Notes (Signed)
BP 108/70 mmHg  Pulse 80  Temp(Src) 98.1 F (36.7 C) (Oral)  Wt 121 lb 8 oz (55.112 kg)   CC: CPE  Subjective:    Patient ID: Krista Perez, female    DOB: [birth year], 45 y.o.   MRN: [id number]  HPI: Krista Perez is a 45 y.o. female presenting on 02/16/2015 for Annual Exam   Perimenopausal. LMP about 1 yr ago Continues 1/2 tab xanax  - due to mother's terminal ovarian cancer diagnosis. She is currently in [place] - going through chemo treatment.  Exercising as natural antidepressant Precordial chest pain - seen by cards 01/2015 - pending ETT on Thursday.  Interested in temporary trial of sleep medicine.  Occasional L head paresthesias not associated with headache.   Preventative: Well woman established with OBGYN Dr Macon Large  Last Pap: ASCUS pap smear and negative high-risk HPV on 12/08/2013. Pap 11/2014 WNL. Last mammogram: 11/25/2013 WNL.  Tetanus - thinks 2011 Flu shot - declines Completed hep B. Seat belt use discussed. No sunburns in last year. Sunscreen use discussed. Changing mole   Caffeine: rare decaf  Lives with husband and daughter (2009) and 1 dog  Occupation: housewife, Orthoptist. Prior lived in [place], husband is DE[employer] agent  Activity: gym 3x/wk  Diet: cooks at home. Good water, fruits/vegetables daily  Relevant past medical, surgical, family and social history reviewed and updated as indicated. Interim medical history since our last visit reviewed. Allergies and medications reviewed and updated. Current Outpatient Prescriptions on File Prior to Visit  Medication Sig  . ALPRAZolam (XANAX) 0.5 MG tablet TAKE 1 TABLET BY MOUTH EVERY NIGHT AT BEDTIME AS NEEDED. (Patient taking differently: 0.25 MG BY MOUTH EVERY NIGHT AT BEDTIME)  . ranitidine (ZANTAC) 150 MG capsule Take 1 capsule (150 mg total) by mouth 2 (two) times daily.  . sucralfate (CARAFATE) 1 g tablet Take 1 tablet (1 g total) by mouth 4 (four) times daily. (Patient not taking: Reported  on 02/03/2015)   No current facility-administered medications on file prior to visit.    Review of Systems  Constitutional: Negative for fever, chills, activity change, appetite change, fatigue and unexpected weight change.  HENT: Negative for hearing loss.   Eyes: Negative for visual disturbance.  Respiratory: Negative for cough, chest tightness, shortness of breath and wheezing.   Cardiovascular: Positive for chest pain. Negative for palpitations and leg swelling.  Gastrointestinal: Negative for nausea, vomiting, abdominal pain, diarrhea, constipation, blood in stool and abdominal distention.  Genitourinary: Negative for hematuria and difficulty urinating.  Musculoskeletal: Negative for myalgias, arthralgias and neck pain.  Skin: Negative for rash.  Neurological: Positive for headaches. Negative for dizziness, seizures and syncope.  Hematological: Negative for adenopathy. Does not bruise/bleed easily.  Psychiatric/Behavioral: Negative for dysphoric mood. The patient is not nervous/anxious.    Per HPI unless specifically indicated in ROS section     Objective:    BP 108/70 mmHg  Pulse 80  Temp(Src) 98.1 F (36.7 C) (Oral)  Wt 121 lb 8 oz (55.112 kg)  Wt Readings from Last 3 Encounters:  02/16/15 121 lb 8 oz (55.112 kg)  02/03/15 123 lb (55.792 kg)  01/26/15 120 lb (54.432 kg)    Physical Exam  Constitutional: She is oriented to person, place, and time. She appears well-developed and well-nourished. No distress.  HENT:  Head: Normocephalic and atraumatic.  Right Ear: Hearing, tympanic membrane, external ear and ear canal normal.  Left Ear: Hearing, tympanic membrane, external ear and ear canal normal.  Nose: Nose  normal.  Mouth/Throat: Uvula is midline, oropharynx is clear and moist and mucous membranes are normal. No oropharyngeal exudate, posterior oropharyngeal edema or posterior oropharyngeal erythema.  Eyes: Conjunctivae and EOM are normal. Pupils are equal, round, and  reactive to light. No scleral icterus.  Neck: Normal range of motion. Neck supple. Carotid bruit is not present. No thyromegaly present.  Cardiovascular: Normal rate, regular rhythm, normal heart sounds and intact distal pulses.   No murmur heard. Pulses:      Radial pulses are 2+ on the right side, and 2+ on the left side.  Pulmonary/Chest: Effort normal and breath sounds normal. No respiratory distress. She has no wheezes. She has no rales.  Abdominal: Soft. Bowel sounds are normal. She exhibits no distension and no mass. There is no tenderness. There is no rebound and no guarding.  Musculoskeletal: Normal range of motion. She exhibits no edema.  Lymphadenopathy:    She has no cervical adenopathy.  Neurological: She is alert and oriented to person, place, and time.  CN grossly intact, station and gait intact  Skin: Skin is warm and dry. No rash noted.  Psychiatric: She has a normal mood and affect. Her behavior is normal. Judgment and thought content normal.  Nursing note and vitals reviewed.  Results for orders placed or performed in visit on 02/09/15  Lipid panel  Result Value Ref Range   Cholesterol 219 (H) 0 - 200 mg/dL   Triglycerides 161.0 0.0 - 149.0 mg/dL   HDL 96.04 >54.09 mg/dL   VLDL 81.1 0.0 - 91.4 mg/dL   LDL Cholesterol 782 (H) 0 - 99 mg/dL   Total CHOL/HDL Ratio 5    NonHDL 171.61   TSH  Result Value Ref Range   TSH 1.68 0.35 - 4.50 uIU/mL  Comprehensive metabolic panel  Result Value Ref Range   Sodium 139 135 - 145 mEq/L   Potassium 3.8 3.5 - 5.1 mEq/L   Chloride 103 96 - 112 mEq/L   CO2 29 19 - 32 mEq/L   Glucose, Bld 90 70 - 99 mg/dL   BUN 10 6 - 23 mg/dL   Creatinine, Ser 9.56 0.40 - 1.20 mg/dL   Total Bilirubin 0.4 0.2 - 1.2 mg/dL   Alkaline Phosphatase 56 39 - 117 U/L   AST 16 0 - 37 U/L   ALT 11 0 - 35 U/L   Total Protein 7.8 6.0 - 8.3 g/dL   Albumin 4.4 3.5 - 5.2 g/dL   Calcium 9.4 8.4 - 21.3 mg/dL   GFR 086.57 >84.69 mL/min  Hepatitis A  antibody, total  Result Value Ref Range   Hep A Total Ab REACTIVE (A) NON REACTIVE  Hepatitis C antibody  Result Value Ref Range   HCV Ab NEGATIVE NEGATIVE      Assessment & Plan:   Problem List Items Addressed This Visit    Postmenopausal    Discussed hot flash treatments. Pt declines for now.      History of hepatitis A   Health maintenance examination - Primary    Preventative protocols reviewed and updated unless pt declined. Discussed healthy diet and lifestyle.       Adjustment disorder    Prn xanax. Requests trial ambien for sleep. Discussed temporary course, habit forming, update UDS today.          Follow up plan: Return in about 1 year (around 02/16/2016), or if symptoms worsen or fail to improve, for as needed.

## 2015-02-16 NOTE — Progress Notes (Signed)
Pre visit review using our clinic review tool, if applicable. No additional management support is needed unless otherwise documented below in the visit note. 

## 2015-02-16 NOTE — Assessment & Plan Note (Signed)
Discussed hot flash treatments. Pt declines for now.

## 2015-02-16 NOTE — Assessment & Plan Note (Signed)
Preventative protocols reviewed and updated unless pt declined. Discussed healthy diet and lifestyle.  

## 2015-02-16 NOTE — Assessment & Plan Note (Signed)
Prn xanax. Requests trial ambien for sleep. Discussed temporary course, habit forming, update UDS today.

## 2015-02-18 ENCOUNTER — Ambulatory Visit (INDEPENDENT_AMBULATORY_CARE_PROVIDER_SITE_OTHER): Payer: Federal, State, Local not specified - PPO

## 2015-02-18 ENCOUNTER — Encounter: Payer: Federal, State, Local not specified - PPO | Admitting: Physician Assistant

## 2015-02-18 DIAGNOSIS — R072 Precordial pain: Secondary | ICD-10-CM | POA: Diagnosis not present

## 2015-02-18 LAB — EXERCISE TOLERANCE TEST
CHL CUP MPHR: 175 {beats}/min
CHL CUP RESTING HR STRESS: 77 {beats}/min
CHL RATE OF PERCEIVED EXERTION: 15
CSEPED: 10 min
CSEPEDS: 0 s
Estimated workload: 11.7 METS
Peak HR: 176 {beats}/min
Percent HR: 101 %

## 2015-03-03 ENCOUNTER — Encounter: Payer: Self-pay | Admitting: Family Medicine

## 2015-03-10 ENCOUNTER — Other Ambulatory Visit: Payer: Self-pay | Admitting: Family Medicine

## 2015-03-10 NOTE — Telephone Encounter (Signed)
Ok to refill 

## 2015-03-11 ENCOUNTER — Other Ambulatory Visit: Payer: Self-pay | Admitting: Family Medicine

## 2015-03-11 NOTE — Telephone Encounter (Signed)
Rx called in as directed.   

## 2015-03-11 NOTE — Telephone Encounter (Signed)
plz phone in. 

## 2015-03-30 ENCOUNTER — Encounter: Payer: Self-pay | Admitting: Family Medicine

## 2015-03-30 ENCOUNTER — Ambulatory Visit (INDEPENDENT_AMBULATORY_CARE_PROVIDER_SITE_OTHER): Payer: Federal, State, Local not specified - PPO | Admitting: Family Medicine

## 2015-03-30 VITALS — BP 114/82 | HR 104 | Temp 98.6°F | Ht 62.0 in | Wt 124.0 lb

## 2015-03-30 DIAGNOSIS — F4329 Adjustment disorder with other symptoms: Secondary | ICD-10-CM | POA: Diagnosis not present

## 2015-03-30 DIAGNOSIS — B9689 Other specified bacterial agents as the cause of diseases classified elsewhere: Secondary | ICD-10-CM

## 2015-03-30 DIAGNOSIS — J019 Acute sinusitis, unspecified: Secondary | ICD-10-CM | POA: Diagnosis not present

## 2015-03-30 MED ORDER — AMOXICILLIN-POT CLAVULANATE 875-125 MG PO TABS
1.0000 | ORAL_TABLET | Freq: Two times a day (BID) | ORAL | Status: AC
Start: 2015-03-30 — End: 2015-04-09

## 2015-03-30 NOTE — Progress Notes (Addendum)
BP 114/82 mmHg  Pulse 104  Temp(Src) 98.6 F (37 C) (Oral)  Ht 5\' 2"  (1.575 m)  Wt 124 lb (56.246 kg)  BMI 22.67 kg/m2  SpO2 98%   CC: cough/congestion  Subjective:    Patient ID: Krista Perez, female    DOB: May 20, 1970, 45 y.o.   MRN: [id number]  HPI: Krista CasinoMary Welter is a 45 y.o. female presenting on 03/30/2015 for Acute Visit   Mild stuffiness over last 2 weeks, worse over last 2 days. Nasal congestion with dark green mucous, chest congestion and pressure with cough that staretd today, eye drainage. Some headache. Mild R earache. Feverish with chills. + PNDrainage.   ST initially, now better. No dyspnea or wheezing.  Daughter and husband ill initially now better.  Treating with tylenol and zyrtec.  No smokers at home.  No h/o asthma.  + seasonal allergies.   Unable to keep flight to [place] - mom with ovarian cancer.  Requests letter for flight.   Relevant past medical, surgical, family and social history reviewed and updated as indicated. Interim medical history since our last visit reviewed. Allergies and medications reviewed and updated. Current Outpatient Prescriptions on File Prior to Visit  Medication Sig  . ALPRAZolam (XANAX) 0.5 MG tablet TAKE 1 TABLET BY MOUTH EVERY NIGHT AT BEDTIME AS NEEDED.  . ranitidine (ZANTAC) 150 MG capsule Take 1 capsule (150 mg total) by mouth 2 (two) times daily.  Marland Kitchen. zolpidem (AMBIEN) 5 MG tablet Take 0.5-1 tablets (2.5-5 mg total) by mouth at bedtime as needed for sleep.   No current facility-administered medications on file prior to visit.    Review of Systems Per HPI unless specifically indicated in ROS section     Objective:    BP 114/82 mmHg  Pulse 104  Temp(Src) 98.6 F (37 C) (Oral)  Ht 5\' 2"  (1.575 m)  Wt 124 lb (56.246 kg)  BMI 22.67 kg/m2  SpO2 98%  Wt Readings from Last 3 Encounters:  03/30/15 124 lb (56.246 kg)  02/16/15 121 lb 8 oz (55.112 kg)  02/03/15 123 lb (55.792 kg)    Physical Exam  Constitutional:  She appears well-developed and well-nourished. No distress.  HENT:  Head: Normocephalic and atraumatic.  Right Ear: Hearing, tympanic membrane, external ear and ear canal normal.  Left Ear: Hearing, tympanic membrane, external ear and ear canal normal.  Nose: Mucosal edema and rhinorrhea present. Right sinus exhibits frontal sinus tenderness. Right sinus exhibits no maxillary sinus tenderness. Left sinus exhibits frontal sinus tenderness. Left sinus exhibits no maxillary sinus tenderness.  Mouth/Throat: Uvula is midline and mucous membranes are normal. Posterior oropharyngeal edema and posterior oropharyngeal erythema present. No oropharyngeal exudate or tonsillar abscesses.  Eyes: Conjunctivae and EOM are normal. Pupils are equal, round, and reactive to light. No scleral icterus.  Neck: Normal range of motion. Neck supple. JVD: mild R AC LAD.  Cardiovascular: Normal rate, regular rhythm, normal heart sounds and intact distal pulses.   No murmur heard. Pulmonary/Chest: Effort normal and breath sounds normal. No respiratory distress. She has no wheezes. She has no rales.  Lymphadenopathy:    She has cervical adenopathy.  Skin: Skin is warm and dry. No rash noted.  Nursing note and vitals reviewed.     Assessment & Plan:   Problem List Items Addressed This Visit    Adjustment disorder    Discussed mother's illness and xanax use      Acute bacterial sinusitis - Primary    Given duration and progression of  symptoms, treat with antibiotic course. Supportive care as per instructions. Update if not improving with treatment. Letter for airline provided today.       Relevant Medications   amoxicillin-clavulanate (AUGMENTIN) 875-125 MG tablet       Follow up plan: Return if symptoms worsen or fail to improve.

## 2015-03-30 NOTE — Patient Instructions (Addendum)
You have sinusitis - treat with augmentin course for 10 days. Push fluids and rest. Ok to take ibuprofen 400-600mg  twice daily with food for sinus inflammation.  Let us know if not improving with treatment.  Letter for airline ticket provided today.

## 2015-03-30 NOTE — Assessment & Plan Note (Signed)
Given duration and progression of symptoms, treat with antibiotic course. Supportive care as per instructions. Update if not improving with treatment. Letter for airline provided today.

## 2015-03-30 NOTE — Progress Notes (Signed)
Pre visit review using our clinic review tool, if applicable. No additional management support is needed unless otherwise documented below in the visit note. 

## 2015-03-30 NOTE — Assessment & Plan Note (Signed)
Discussed mother's illness and xanax use

## 2015-05-26 ENCOUNTER — Other Ambulatory Visit: Payer: Self-pay | Admitting: Family Medicine

## 2015-05-26 NOTE — Telephone Encounter (Signed)
Ok to refill 

## 2015-05-26 NOTE — Telephone Encounter (Signed)
plz phone in. 

## 2015-05-27 NOTE — Telephone Encounter (Signed)
Left refill on voice mail at pharmacy  

## 2015-07-29 ENCOUNTER — Other Ambulatory Visit: Payer: Self-pay | Admitting: *Deleted

## 2015-07-29 NOTE — Telephone Encounter (Signed)
Ok to refill 

## 2015-07-30 MED ORDER — ALPRAZOLAM 0.5 MG PO TABS: ORAL_TABLET | ORAL | Status: DC

## 2015-07-30 NOTE — Telephone Encounter (Signed)
Rx called in as directed.   

## 2015-07-30 NOTE — Telephone Encounter (Signed)
plz phone in. 

## 2015-08-25 ENCOUNTER — Encounter: Payer: Self-pay | Admitting: Family Medicine

## 2015-08-25 ENCOUNTER — Ambulatory Visit (INDEPENDENT_AMBULATORY_CARE_PROVIDER_SITE_OTHER): Payer: Federal, State, Local not specified - PPO | Admitting: Family Medicine

## 2015-08-25 VITALS — BP 122/74 | HR 64 | Temp 98.2°F | Wt 128.8 lb

## 2015-08-25 DIAGNOSIS — M2550 Pain in unspecified joint: Secondary | ICD-10-CM

## 2015-08-25 DIAGNOSIS — M5441 Lumbago with sciatica, right side: Secondary | ICD-10-CM

## 2015-08-25 MED ORDER — ALPRAZOLAM 0.5 MG PO TABS: ORAL_TABLET | ORAL | 0 refills | Status: DC

## 2015-08-25 NOTE — Assessment & Plan Note (Addendum)
No active synovitis today. 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 Suggested start glucosamine 1 tablet daily. Update with effect. If no better, consider CBC, ESR, RF, urate.

## 2015-08-25 NOTE — Progress Notes (Signed)
Pre visit review using our clinic review tool, if applicable. No additional management support is needed unless otherwise documented below in the visit note. 

## 2015-08-25 NOTE — Patient Instructions (Addendum)
Empieze glucosamine para salud de articulaciones - por ejemplo osteo bi flex.  Siga calcio y vitamina D.  siga dieta diversa y saludable.

## 2015-08-25 NOTE — Progress Notes (Signed)
   BP 122/74   Pulse 64   Temp 98.2 F (36.8 C) (Oral)   Wt 128 lb 12 oz (58.4 kg)   BMI 23.55 kg/m    CC: discuss joint pains Subjective:    Patient ID: Krista Perez, female    DOB: Jul 31, 1970, 45 y.o.   MRN: [id number]  HPI: Krista Perez is a 45 y.o. female presenting on 08/25/2015 for Joint Pain   3 wk h/o joint pains in amworse in the morning when she waknes up. Some morning stiffness. Describes pain at fingers, wrists, ankles, and R hip. Also notes pain at 1st MTPJs. She also notes some forearm muscle discomfort. Denies inciting trauma.   No redness, warmth or swelling of joints.  No fevers/chills, new rash, abd pain, eye redness.   No diet change, no new medications or supplements.  She has just started taking calcium and vitamin D.   H/o joint pains during pregnancy.   Relevant past medical, surgical, family and social history reviewed and updated as indicated. Interim medical history since our last visit reviewed. All wk hergies and medications reviewed and updated. Current Outpatient Prescriptions on File Prior to Visit  Medication Sig  . ranitidine (ZANTAC) 150 MG capsule Take 1 capsule (150 mg total) by mouth 2 (two) times daily. (Patient not taking: Reported on 08/25/2015)   No current facility-administered medications on file prior to visit.     Review of Systems Per HPI unless specifically indicated in ROS section     Objective:    BP 122/74   Pulse 64   Temp 98.2 F (36.8 C) (Oral)   Wt 128 lb 12 oz (58.4 kg)   BMI 23.55 kg/m   Wt Readings from Last 3 Encounters:  08/25/15 128 lb 12 oz (58.4 kg)  03/30/15 124 lb (56.2 kg)  02/16/15 121 lb 8 oz (55.1 kg)    Physical Exam  Constitutional: She appears well-developed and well-nourished. No distress.  Musculoskeletal: She exhibits no edema.  No active synovitis Tender to palpation 1st CMC on left FROM at wrists, digits, ankles. No pain at 1st MTPJ. No pain midline spine No paraspinous mm  tenderness Neg SLR bilaterally. No pain with int/ext rotation at hip. Neg FABER. No pain at SIJ, GTB bilaterally. Mild discomfort to palpation at right sciatic notch.  Skin: Skin is warm and dry. No rash noted. No erythema.  Psychiatric: She has a normal mood and affect.  Nursing note and vitals reviewed.     Assessment & Plan:   Problem List Items Addressed This Visit    Arthralgia - Primary    No active synovitis today. 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 Suggested start glucosamine 1 tablet daily. Update with effect. If no better, consider CBC, ESR, RF, urate.       Right-sided low back pain with right-sided sciatica    Mild. Provided with piriformis exercises (mailed to paitent).        Other Visit Diagnoses   None.      Follow up plan: No Follow-up on file.  Ria Bush, MD

## 2015-08-25 NOTE — Assessment & Plan Note (Signed)
Mild. Provided with piriformis exercises (mailed to paitent).

## 2015-09-07 ENCOUNTER — Encounter: Payer: Self-pay | Admitting: Family Medicine

## 2015-09-07 DIAGNOSIS — M2550 Pain in unspecified joint: Secondary | ICD-10-CM

## 2015-09-10 NOTE — Telephone Encounter (Signed)
plz schedule lab appt for patient this coming week. Thanks.

## 2015-09-13 NOTE — Telephone Encounter (Signed)
Message left for patient to return my call and schedule lab appt.  

## 2015-09-16 ENCOUNTER — Other Ambulatory Visit (INDEPENDENT_AMBULATORY_CARE_PROVIDER_SITE_OTHER): Payer: Federal, State, Local not specified - PPO

## 2015-09-16 DIAGNOSIS — M2550 Pain in unspecified joint: Secondary | ICD-10-CM

## 2015-09-16 LAB — CBC WITH DIFFERENTIAL/PLATELET
BASOS PCT: 0.4 % (ref 0.0–3.0)
Basophils Absolute: 0 10*3/uL (ref 0.0–0.1)
EOS PCT: 3.4 % (ref 0.0–5.0)
Eosinophils Absolute: 0.2 10*3/uL (ref 0.0–0.7)
HCT: 40.1 % (ref 36.0–46.0)
Hemoglobin: 13.6 g/dL (ref 12.0–15.0)
LYMPHS ABS: 1.5 10*3/uL (ref 0.7–4.0)
Lymphocytes Relative: 26.7 % (ref 12.0–46.0)
MCHC: 34.1 g/dL (ref 30.0–36.0)
MCV: 81.3 fl (ref 78.0–100.0)
MONO ABS: 0.3 10*3/uL (ref 0.1–1.0)
Monocytes Relative: 5.4 % (ref 3.0–12.0)
NEUTROS ABS: 3.6 10*3/uL (ref 1.4–7.7)
NEUTROS PCT: 64.1 % (ref 43.0–77.0)
PLATELETS: 262 10*3/uL (ref 150.0–400.0)
RBC: 4.93 Mil/uL (ref 3.87–5.11)
RDW: 13.7 % (ref 11.5–15.5)
WBC: 5.7 10*3/uL (ref 4.0–10.5)

## 2015-09-16 LAB — SEDIMENTATION RATE: SED RATE: 29 mm/h — AB (ref 0–20)

## 2015-09-16 LAB — RHEUMATOID FACTOR: Rhuematoid fact SerPl-aCnc: <10 IU/mL (ref <=14)

## 2015-09-16 LAB — URIC ACID: Uric Acid, Serum: 3.6 mg/dL (ref 2.4–7.0)

## 2015-09-19 ENCOUNTER — Encounter: Payer: Self-pay | Admitting: Family Medicine

## 2015-10-22 ENCOUNTER — Ambulatory Visit (INDEPENDENT_AMBULATORY_CARE_PROVIDER_SITE_OTHER): Payer: Federal, State, Local not specified - PPO

## 2015-10-22 ENCOUNTER — Ambulatory Visit: Payer: Federal, State, Local not specified - PPO

## 2015-10-22 DIAGNOSIS — Z23 Encounter for immunization: Secondary | ICD-10-CM | POA: Diagnosis not present

## 2015-11-23 DIAGNOSIS — K080 Exfoliation of teeth due to systemic causes: Secondary | ICD-10-CM | POA: Diagnosis not present

## 2015-11-24 ENCOUNTER — Other Ambulatory Visit: Payer: Self-pay | Admitting: *Deleted

## 2015-11-24 NOTE — Telephone Encounter (Signed)
Ok to refill? Last filled 08/25/15 #30 0RF

## 2015-11-25 MED ORDER — ALPRAZOLAM 0.5 MG PO TABS: ORAL_TABLET | ORAL | 0 refills | Status: DC

## 2015-11-25 NOTE — Telephone Encounter (Signed)
Rx called in as directed.   

## 2015-11-25 NOTE — Telephone Encounter (Signed)
plz phone in. 

## 2015-12-02 ENCOUNTER — Telehealth: Payer: Self-pay | Admitting: *Deleted

## 2015-12-02 DIAGNOSIS — N644 Mastodynia: Secondary | ICD-10-CM

## 2015-12-02 DIAGNOSIS — Z1239 Encounter for other screening for malignant neoplasm of breast: Secondary | ICD-10-CM

## 2015-12-02 NOTE — Telephone Encounter (Signed)
-----   Message from Olevia BowensJacinda S Battle sent at 12/02/2015  2:18 PM EST ----- Regarding: Mammo Order Contact: [phone] Called and states she needs an order for a diagnostic mammo, so she can schedule her yearly exam (states she is having some breast pain)

## 2015-12-02 NOTE — Telephone Encounter (Signed)
Order is placed for Diagnostic Mammogram.

## 2015-12-22 ENCOUNTER — Ambulatory Visit: Payer: Federal, State, Local not specified - PPO | Admitting: Obstetrics and Gynecology

## 2015-12-27 ENCOUNTER — Telehealth: Payer: Self-pay | Admitting: Family Medicine

## 2015-12-27 MED ORDER — ALPRAZOLAM 0.5 MG PO TABS: ORAL_TABLET | ORAL | 0 refills | Status: DC

## 2015-12-27 NOTE — Telephone Encounter (Signed)
Spoke with pt about mother's death, expressed my condolences. Pt requests xanax refilled. Sent in.

## 2016-01-11 ENCOUNTER — Telehealth: Payer: Self-pay | Admitting: *Deleted

## 2016-01-11 DIAGNOSIS — N644 Mastodynia: Secondary | ICD-10-CM

## 2016-01-11 NOTE — Telephone Encounter (Signed)
-----   Message from Olevia BowensJacinda S Battle sent at 01/10/2016  2:51 PM EST ----- Regarding: Mammo Order [facility] and need a correction for her diagnostic mammo (the reason can NOT be screening for breast cancer)   Insurance won't pay for it   Also they want us to place an order for a left breast ultrasound for left breast pain

## 2016-01-11 NOTE — Telephone Encounter (Signed)
Order placed for Diagnostic Mammo and left Breast US.

## 2016-01-20 ENCOUNTER — Telehealth: Payer: Self-pay

## 2016-01-20 MED ORDER — POLYMYXIN B-TRIMETHOPRIM 10000-0.1 UNIT/ML-% OP SOLN: 1.0000 [drp] | Freq: Four times a day (QID) | OPHTHALMIC | 0 refills | Status: DC

## 2016-01-20 NOTE — Telephone Encounter (Signed)
Pt left v/m requesting abx for [place] eye [facility] [place]. Pt said has been sent for pink eye before. Pt is presently in [place]. Pt request cb.

## 2016-01-20 NOTE — Telephone Encounter (Signed)
rec warm compresses first as if viral pink eye, should improve on its own. abx drops sent in. If ongoing symptoms, will need [facility] [place].

## 2016-01-20 NOTE — Telephone Encounter (Signed)
Patient notified and verbalized understanding. 

## 2016-02-04 ENCOUNTER — Encounter: Payer: Self-pay | Admitting: Obstetrics & Gynecology

## 2016-02-04 ENCOUNTER — Ambulatory Visit (INDEPENDENT_AMBULATORY_CARE_PROVIDER_SITE_OTHER): Payer: Federal, State, Local not specified - PPO | Admitting: Obstetrics & Gynecology

## 2016-02-04 VITALS — BP 112/74 | HR 72 | Resp 18 | Ht 61.0 in | Wt 132.0 lb

## 2016-02-04 DIAGNOSIS — Z0000 Encounter for general adult medical examination without abnormal findings: Secondary | ICD-10-CM | POA: Diagnosis not present

## 2016-02-04 DIAGNOSIS — Z01419 Encounter for gynecological examination (general) (routine) without abnormal findings: Secondary | ICD-10-CM

## 2016-02-04 DIAGNOSIS — N644 Mastodynia: Secondary | ICD-10-CM

## 2016-02-04 NOTE — Progress Notes (Signed)
GYNECOLOGY ANNUAL PREVENTATIVE CARE ENCOUNTER NOTE  Subjective:   Krista Perez is a 46 y.o. [id number] female here for a routine annual gynecologic exam.  Current complaints: Perez of both breasts L>R. She is worried about her implants. Already scheduled for diagnostic mammogram next week. Denies palpating any lesions, abnormal drainage or other breast concerns.   Denies abnormal vaginal bleeding, discharge, pelvic Perez, problems with intercourse or other gynecologic concerns.    Gynecologic History Patient's last menstrual period was 09/11/2012. Contraception: none Last Pap: 12/19/2014. Results were: normal with negative HRHPV Last mammogram: 12/16/2014. Results were: normal  Obstetric History OB History  Gravida Para Term Preterm AB Living  3 1 0 1 2 1   SAB TAB Ectopic Multiple Live Births  2       1    # Outcome Date GA Lbr Len/2nd Weight Sex Delivery Anes PTL Lv  3 Preterm 07/06/07 [id number]   F CS-LTranv   LIV     Complications: Severe preeclampsia  2 SAB           1 SAB               Past Medical History:  Diagnosis Date  . Chest Perez    a. 06/2014 - eval in [place] for sharp c/p;  b. 01/2015 ED vist, neg trop.  . Generalized headaches    frequent  . History of hepatitis    a. Hep A after contaminated food, cleared  . Hx of migraines   . Hyperlipidemia    a. 01/2014 TC 215, TG 139, HDL 44, LDL 143.  Marland Kitchen. Palpitations     Past Surgical History:  Procedure Laterality Date  . APPENDECTOMY  1990  . BREAST ENHANCEMENT SURGERY    . TONSILLECTOMY  1980    Current Outpatient Prescriptions on File Prior to Visit  Medication Sig Dispense Refill  . ALPRAZolam (XANAX) 0.5 MG tablet TAKE 1 TABLET BY MOUTH EVERY NIGHT AT BEDTIME AS NEEDED. 30 tablet 0  . calcium-vitamin D (OSCAL WITH D) 500-200 MG-UNIT tablet Take 2 tablets by mouth daily.    . ranitidine (ZANTAC) 150 MG capsule Take 1 capsule (150 mg total) by mouth 2 (two) times daily. 28 capsule 0   No current  facility-administered medications on file prior to visit.     No Known Allergies  Social History   Social History  . Marital status: Married    Spouse name: N/A  . Number of children: N/A  . Years of education: N/A   Occupational History  . Not on file.   Social History Main Topics  . Smoking status: Never Smoker  . Smokeless tobacco: Never Used  . Alcohol use Yes     Comment: Rare  . Drug use: No  . Sexual activity: Not Currently    Partners: Male    Birth control/ protection: Post-menopausal   Other Topics Concern  . Not on file   Social History Narrative   Caffeine: rare decaf   Lives with husband and daughter (2009) and 1 dog   Occupation: Works as Ecologistmedical interpreter.  Prior lived in [place], husband is DEA agent   Activity: gym 3x/wk prior to 06/2014 - much less active now.   Diet: cooks at home.  Good water, fruits/vegetables daily    Family History  Problem Relation Age of Onset  . Cancer Maternal Grandfather 50    lung (smoker)  . Cancer Maternal Grandmother 70    leukemia  . Pancreatic cancer  Mother 67    Terminal cancer dx in her 32's - still alive, lives in [place].  . Other Father     alive and well, 61, lives in [place], [place]  . Other Brother     alive and well, lives in [place], [place].  . Coronary artery disease Neg Hx   . Stroke Neg Hx   . Diabetes Neg Hx   . Hypertension Neg Hx     The following portions of the patient's history were reviewed and updated as appropriate: allergies, current medications, past family history, past medical history, past social history, past surgical history and problem list.  Review of Systems Pertinent items noted in HPI and remainder of comprehensive ROS otherwise negative.   Objective:  BP 112/74 (BP Location: Left Arm, Patient Position: Sitting, Cuff Size: Normal)   Pulse 72   Resp 18   Ht 5\' 1"  (1.549 m)   Wt 132 lb (59.9 kg)   LMP 09/11/2012   BMI 24.94 kg/m  CONSTITUTIONAL: Well-developed, well-nourished  female in no acute distress.  HENT:  Normocephalic, atraumatic, External right and left ear normal. Oropharynx is clear and moist EYES: Conjunctivae and EOM are normal. Pupils are equal, round, and reactive to light. No scleral icterus.  NECK: Normal range of motion, supple, no masses.  Normal thyroid.  SKIN: Skin is warm and dry. No rash noted. Not diaphoretic. No erythema. No pallor. NEUROLOGIC: Alert and oriented to person, place, and time. Normal reflexes, muscle tone coordination. No cranial nerve deficit noted. PSYCHIATRIC: Normal mood and affect. Normal behavior. Normal judgment and thought content. CARDIOVASCULAR: Normal heart rate noted, regular rhythm RESPIRATORY: Clear to auscultation bilaterally. Effort and breath sounds normal, no problems with respiration noted. BREASTS: Symmetric in size. No masses, skin changes, nipple drainage, or lymphadenopathy. Implants in place. No anomalies palpated. ABDOMEN: Soft, normal bowel sounds, no distention noted.  No tenderness, rebound or guarding.  PELVIC: Normal appearing external genitalia; normal appearing vaginal mucosa and cervix.  No abnormal discharge noted.  Pap smear obtained.  Normal uterine size, no other palpable masses, no uterine or adnexal tenderness. MUSCULOSKELETAL: Normal range of motion. No tenderness.  No cyanosis, clubbing, or edema.  2+ distal pulses.   Assessment:  Annual gynecologic examination with pap smear Bilateral breast Perez   Plan:  Will follow up results of pap smear and manage accordingly. Diagnostic mammogram scheduled 02/08/2016; will follow up results and manage accordingly. Routine preventative health maintenance measures emphasized. Please refer to After Visit Summary for other counseling recommendations.    Jaynie Collins, MD, FACOG Attending Obstetrician & Gynecologist, [facility]

## 2016-02-04 NOTE — Progress Notes (Signed)
Last Pap 11/2014 - Normal pap smear and negative high-risk HPV Mammogram scheduled 01/2016 Influenza vaccine - 09/2015

## 2016-02-04 NOTE — Patient Instructions (Signed)

## 2016-02-08 ENCOUNTER — Other Ambulatory Visit: Payer: Self-pay

## 2016-02-08 ENCOUNTER — Ambulatory Visit
Admission: RE | Admit: 2016-02-08 | Discharge: 2016-02-08 | Disposition: A | Discharge status: Home / Self Care | Payer: Federal, State, Local not specified - PPO | Source: Ambulatory Visit | Attending: Obstetrics & Gynecology | Admitting: Obstetrics & Gynecology

## 2016-02-08 DIAGNOSIS — N644 Mastodynia: Secondary | ICD-10-CM

## 2016-02-08 DIAGNOSIS — Z1239 Encounter for other screening for malignant neoplasm of breast: Secondary | ICD-10-CM

## 2016-02-08 DIAGNOSIS — R922 Inconclusive mammogram: Secondary | ICD-10-CM | POA: Diagnosis not present

## 2016-02-08 LAB — CYTOLOGY - PAP
DIAGNOSIS: NEGATIVE
HPV (WINDOPATH): NOT DETECTED

## 2016-02-10 ENCOUNTER — Other Ambulatory Visit: Payer: Self-pay | Admitting: Family Medicine

## 2016-02-10 ENCOUNTER — Other Ambulatory Visit: Payer: Federal, State, Local not specified - PPO

## 2016-02-10 DIAGNOSIS — M2550 Pain in unspecified joint: Secondary | ICD-10-CM

## 2016-02-10 DIAGNOSIS — E785 Hyperlipidemia, unspecified: Secondary | ICD-10-CM

## 2016-02-11 ENCOUNTER — Other Ambulatory Visit (INDEPENDENT_AMBULATORY_CARE_PROVIDER_SITE_OTHER): Payer: Federal, State, Local not specified - PPO

## 2016-02-11 DIAGNOSIS — M2550 Pain in unspecified joint: Secondary | ICD-10-CM | POA: Diagnosis not present

## 2016-02-11 DIAGNOSIS — E785 Hyperlipidemia, unspecified: Secondary | ICD-10-CM | POA: Diagnosis not present

## 2016-02-11 LAB — COMPREHENSIVE METABOLIC PANEL
ALBUMIN: 4.3 g/dL (ref 3.5–5.2)
ALK PHOS: 68 U/L (ref 39–117)
ALT: 11 U/L (ref 0–35)
AST: 13 U/L (ref 0–37)
BUN: 9 mg/dL (ref 6–23)
CALCIUM: 9.4 mg/dL (ref 8.4–10.5)
CHLORIDE: 103 meq/L (ref 96–112)
CO2: 27 mEq/L (ref 19–32)
Creatinine, Ser: 0.59 mg/dL (ref 0.40–1.20)
GFR: 116.22 mL/min (ref >60.00)
Glucose, Bld: 88 mg/dL (ref 70–99)
POTASSIUM: 4.2 meq/L (ref 3.5–5.1)
Sodium: 138 mEq/L (ref 135–145)
TOTAL PROTEIN: 7.7 g/dL (ref 6.0–8.3)
Total Bilirubin: 0.4 mg/dL (ref 0.2–1.2)

## 2016-02-11 LAB — LIPID PANEL
CHOL/HDL RATIO: 6
CHOLESTEROL: 261 mg/dL — AB (ref 0–200)
HDL: 45.5 mg/dL (ref >39.00)
NonHDL: 215.63
Triglycerides: 236 mg/dL — ABNORMAL HIGH (ref 0.0–149.0)
VLDL: 47.2 mg/dL — AB (ref 0.0–40.0)

## 2016-02-11 LAB — SEDIMENTATION RATE: Sed Rate: 5 mm/hr (ref 0–20)

## 2016-02-11 LAB — LDL CHOLESTEROL, DIRECT: LDL DIRECT: 169 mg/dL

## 2016-02-11 LAB — TSH: TSH: 2.56 u[IU]/mL (ref 0.35–4.50)

## 2016-02-17 ENCOUNTER — Encounter: Payer: Self-pay | Admitting: Family Medicine

## 2016-02-17 ENCOUNTER — Ambulatory Visit (INDEPENDENT_AMBULATORY_CARE_PROVIDER_SITE_OTHER): Payer: Federal, State, Local not specified - PPO | Admitting: Family Medicine

## 2016-02-17 VITALS — BP 122/64 | HR 72 | Temp 98.4°F | Ht 61.0 in | Wt 131.2 lb

## 2016-02-17 DIAGNOSIS — E7800 Pure hypercholesterolemia, unspecified: Secondary | ICD-10-CM | POA: Diagnosis not present

## 2016-02-17 DIAGNOSIS — F4322 Adjustment disorder with anxiety: Secondary | ICD-10-CM | POA: Diagnosis not present

## 2016-02-17 DIAGNOSIS — Z0000 Encounter for general adult medical examination without abnormal findings: Secondary | ICD-10-CM | POA: Diagnosis not present

## 2016-02-17 DIAGNOSIS — R002 Palpitations: Secondary | ICD-10-CM | POA: Diagnosis not present

## 2016-02-17 DIAGNOSIS — R0789 Other chest pain: Secondary | ICD-10-CM

## 2016-02-17 MED ORDER — ALPRAZOLAM 0.5 MG PO TABS: ORAL_TABLET | ORAL | 0 refills | Status: DC

## 2016-02-17 MED ORDER — RANITIDINE HCL 150 MG PO CAPS: 150.0000 mg | ORAL_CAPSULE | Freq: Two times a day (BID) | ORAL | 1 refills | Status: DC

## 2016-02-17 NOTE — Progress Notes (Signed)
Pre visit review using our clinic review tool, if applicable. No additional management support is needed unless otherwise documented below in the visit note. 

## 2016-02-17 NOTE — Progress Notes (Signed)
BP 122/64   Pulse 72   Temp 98.4 F (36.9 C) (Oral)   Ht 5\' 1"  (1.549 m)   Wt 131 lb 4 oz (59.5 kg)   LMP 10/05/2012   BMI 24.80 kg/m    CC: CPE Subjective:    Patient ID: Krista Perez, female    DOB: 12/08/1970, 46 y.o.   MRN: [id number]  HPI: Krista Perez is a 46 y.o. female presenting on 02/17/2016 for Annual Exam   Mother passed away in the past year from ovarian cancer.   Ongoing palpitations - with episode of chest pressure 3 months ago. This week feeling more pressure in chest, reproducible to palpation at left costochondral junction.   H/o breast implants - that she wants removed.   She has had anxiety attacks worsen in the past year, now getting better over the past year.   Preventative: Well woman - Dr Macon Large last week - pending pap. h/o ASCUS pap smear and negative high-risk HPV on 12/08/2013. Pap 11/2014 WNL. Last mammogram: 01/2016 WNL.  Flu shot - yearly Tetanus - thinks 2011 Completed hep B. Seat belt use discussed. Sunscreen use discussed. No changing moles Non smoker Alcohol - rare  Caffeine: rare decaf  Lives with husband and daughter (2009) and 1 dog  Occupation: housewife, Orthoptist. Prior lived in [place], husband is [employer] agent  Activity: no regular exercise  Diet: cooks at home. good water, fruits/vegetables daily   Relevant past medical, surgical, family and social history reviewed and updated as indicated. Interim medical history since our last visit reviewed. Allergies and medications reviewed and updated. Current Outpatient Prescriptions on File Prior to Visit  Medication Sig  . calcium-vitamin D (OSCAL WITH D) 500-200 MG-UNIT tablet Take 2 tablets by mouth daily.  . Misc Natural Products (OSTEO BI-FLEX ADV JOINT SHIELD PO) Take 1 tablet by mouth daily.   No current facility-administered medications on file prior to visit.     Review of Systems  Constitutional: Negative for activity change, appetite change, chills,  fatigue, fever and unexpected weight change.  HENT: Negative for hearing loss.   Eyes: Negative for visual disturbance.  Respiratory: Positive for chest tightness and shortness of breath. Negative for cough and wheezing.   Cardiovascular: Positive for palpitations. Negative for chest pain and leg swelling.  Gastrointestinal: Negative for abdominal distention, abdominal pain, blood in stool, constipation, diarrhea, nausea and vomiting.       Indigestion  Genitourinary: Negative for difficulty urinating and hematuria.  Musculoskeletal: Positive for arthralgias. Negative for myalgias and neck pain.  Skin: Negative for rash.  Neurological: Negative for dizziness, seizures, syncope and headaches.  Hematological: Negative for adenopathy. Does not bruise/bleed easily.  Psychiatric/Behavioral: Negative for dysphoric mood. The patient is nervous/anxious.    Per HPI unless specifically indicated in ROS section     Objective:    BP 122/64   Pulse 72   Temp 98.4 F (36.9 C) (Oral)   Ht 5\' 1"  (1.549 m)   Wt 131 lb 4 oz (59.5 kg)   LMP 10/05/2012   BMI 24.80 kg/m   Wt Readings from Last 3 Encounters:  02/17/16 131 lb 4 oz (59.5 kg)  02/04/16 132 lb (59.9 kg)  08/25/15 128 lb 12 oz (58.4 kg)    Physical Exam  Constitutional: She is oriented to person, place, and time. She appears well-developed and well-nourished. No distress.  HENT:  Head: Normocephalic and atraumatic.  Right Ear: Hearing, tympanic membrane, external ear and ear canal normal.  Left Ear: Hearing, tympanic membrane, external ear and ear canal normal.  Nose: Nose normal.  Mouth/Throat: Uvula is midline, oropharynx is clear and moist and mucous membranes are normal. No oropharyngeal exudate, posterior oropharyngeal edema or posterior oropharyngeal erythema.  Eyes: Conjunctivae and EOM are normal. Pupils are equal, round, and reactive to light. No scleral icterus.  Neck: Normal range of motion. Neck supple. No thyromegaly  present.  Cardiovascular: Normal rate, regular rhythm, normal heart sounds and intact distal pulses.   No murmur heard. Pulses:      Radial pulses are 2+ on the right side, and 2+ on the left side.  Pulmonary/Chest: Effort normal and breath sounds normal. No respiratory distress. She has no wheezes. She has no rales. She exhibits tenderness (L 2nd costochondral joint as well as lower L ribcage).  Abdominal: Soft. Bowel sounds are normal. She exhibits no distension and no mass. There is no tenderness. There is no rebound and no guarding.  Musculoskeletal: Normal range of motion. She exhibits no edema.  Lymphadenopathy:    She has no cervical adenopathy.  Neurological: She is alert and oriented to person, place, and time.  CN grossly intact, station and gait intact  Skin: Skin is warm and dry. No rash noted.  Psychiatric: She has a normal mood and affect. Her behavior is normal. Judgment and thought content normal.  Nursing note and vitals reviewed.  Results for orders placed or performed in visit on 02/11/16  Lipid panel  Result Value Ref Range   Cholesterol 261 (H) 0 - 200 mg/dL   Triglycerides 161.0236.0 (H) 0.0 - 149.0 mg/dL   HDL 96.0445.50 >54.09>39.00 mg/dL   VLDL 81.147.2 (H) 0.0 - 91.440.0 mg/dL   Total CHOL/HDL Ratio 6    NonHDL 215.63   TSH  Result Value Ref Range   TSH 2.56 0.35 - 4.50 uIU/mL  Sedimentation rate  Result Value Ref Range   Sed Rate 5 0 - 20 mm/hr  Comprehensive metabolic panel  Result Value Ref Range   Sodium 138 135 - 145 mEq/L   Potassium 4.2 3.5 - 5.1 mEq/L   Chloride 103 96 - 112 mEq/L   CO2 27 19 - 32 mEq/L   Glucose, Bld 88 70 - 99 mg/dL   BUN 9 6 - 23 mg/dL   Creatinine, Ser 7.820.59 0.40 - 1.20 mg/dL   Total Bilirubin 0.4 0.2 - 1.2 mg/dL   Alkaline Phosphatase 68 39 - 117 U/L   AST 13 0 - 37 U/L   ALT 11 0 - 35 U/L   Total Protein 7.7 6.0 - 8.3 g/dL   Albumin 4.3 3.5 - 5.2 g/dL   Calcium 9.4 8.4 - 95.610.5 mg/dL   GFR 213.08116.22 >65.78>60.00 mL/min  LDL cholesterol, direct    Result Value Ref Range   Direct LDL 169.0 mg/dL      Assessment & Plan:   Problem List Items Addressed This Visit    Adjustment disorder    Discussed mother's death and anxiety/mourning. Discussed xanax use.       Chest pressure    Reviewed reassuring cardiac workup last year - anticipate noncardiac source.  She does have reproducible pain at 2nd L costochondral joint and left lower ribcage - suggested aleve PRN for possible costochondritis.      Health maintenance examination - Primary    Preventative protocols reviewed and updated unless pt declined. Discussed healthy diet and lifestyle.       HLD (hyperlipidemia)    Chronic, uncontrolled. Reviewed healthy  diet changes to improve lipid control.  ASCVD 10 yr risk = 1.7%      Palpitations    Ongoing episodes but pt endorses HR 80s when checked during episode. Will continue to monitor for now.           Follow up plan: Return for annual exam, prior fasting for blood work.  Eustaquio Boyden, MD

## 2016-02-17 NOTE — Patient Instructions (Signed)
Gusto verla hoy. Regresar en 1 ao para proximo examen fisico, regresar en 4-6 meses para revisar colesterol de nuevo (laboratorio) Revise su pulso y dejeme saber si irregular o se mantiene mas alto de 100.   [place] (Health Maintenance, Female) Un estilo de vida saludable y los cuidados preventivos pueden favorecer considerablemente a la salud y Musician. Pregunte a su mdico cul es el cronograma de exmenes peridicos apropiado para usted. Esta es una buena oportunidad para consultarlo sobre cmo prevenir enfermedades y [place] sano. Adems de los controles, hay muchas otras cosas que puede hacer usted mismo. Los expertos han realizado numerosas investigaciones [employer] cambios en el estilo de vida y las medidas de prevencin que, [place], lo ayudarn a mantenerse sano. Solicite a su mdico ms informacin. EL PESO Y LA DIETA Consuma una dieta saludable.   Asegrese de [employer] verduras, frutas, productos lcteos de bajo contenido de [place] y Advertising account planner.  No consuma muchos alimentos de alto contenido de grasas slidas, azcares agregados o sal.  Realice actividad fsica con regularidad. Esta es una de las prcticas ms importantes que puede hacer por su salud.  La mayora de los adultos deben hacer ejercicio durante al menos 162mnutos por semana. El ejercicio debe aumentar la frecuencia cardaca y pActorla transpiracin (ejercicio de i[place].  La mayora de los adultos tambin deben hField seismologistejercicios de elongacin al mToysRusveces a la semana. Agregue esto al su plan de ejercicio de intensidad moderada. Mantenga un peso saludable.   El ndice de masa corporal ([facility] es una medida que puede utilizarse para identificar posibles problemas de p[place] Proporciona una estimacin de la grasa corporal basndose en el peso y la altura. Su mdico puede ayudarle a dRadiation protection practitionerIClam Gulchy a lScientist, forensico mTheatre managerun peso saludable.  Para  las mujeres de 20aos o ms:  Un IOcean Medical Centermenor de 18,5 se considera bajo peso.  Un IBay State Wing Memorial Hospital And Medical Centersentre 18,5 y 24,9 es normal.  Un IWekiva Springsentre 25 y 29,9 se considera sobrepeso.  Un IMC de 30 o ms se considera obesidad. Observe los niveles de colesterol y lpidos en la sangre.   Debe comenzar a rEnglish as a second language teacherde lpidos y cResearch officer, trade unionen la sangre a los 20aos y luego repetirlos cada 577aos  Es posible que nAutomotive engineerlos niveles de colesterol con mayor frecuencia si:  Sus niveles de lpidos y colesterol son altos.  Es mayor de 50aos.  Presenta un alto riesgo de padecer enfermedades cardacas. DETECCIN DE CNCER Cncer de pulmn   Se recomienda realizar exmenes de deteccin de cncer de pulmn a personas adultas entre 551y 898aos que estn en riesgo de dHorticulturist, commercialde pulmn por sus antecedentes de consumo de tabaco.  Se recomienda una tomografa computarizada de baja dosis de los pLiberty Mediaaos a las personas que:  Fuman actualmente.  Hayan dejado el hbito en algn momento en los ltimos 15aos.  Hayan fumado durante 30aos un paquete diario. Un paquete-ao equivale a fumar un promedio de un paquete de cigarrillos diario durante un ao.  Los exmenes de deteccin anuales deben continuar hasta que hayan pasado 15aos desde que dej de fumar.  Ya no debern realizarse si tiene un problema de salud que le impida recibir tratamiento para eScience writerde pulmn. Cncer de mama   Practique la autoconciencia de la mama. Esto significa reconocer la apariencia normal de sus mamas y cmo las siente.  Tambin significa realizar autoexmenes regulares  de las [place]. Informe a su mdico sobre cualquier cambio, sin importar cun pequeo sea.  Si tiene entre 20 y 51 aos, un mdico debe realizarle un examen clnico de las mamas como parte del examen regular de [place], cada 1 a 3aos.  Si tiene 40aos o ms, debe Information systems manager clnico de las [employer].  Tambin considere realizarse una [place]) todos los [place].  Si tiene antecedentes familiares de cncer de mama, hable con su mdico para someterse a un estudio gentico.  Si tiene alto riesgo de Chief Financial Officer de mama, hable con su mdico para someterse a Public house manager y [employer].  La evaluacin del gen del cncer de mama (BRCA) se recomienda a mujeres que tengan familiares con cnceres relacionados con el BRCA. Los cnceres relacionados con el BRCA incluyen los siguientes:  Mama.  Ovario.  Trompas.  Cnceres de peritoneo.  Los resultados de la evaluacin determinarn la necesidad de asesoramiento gentico y de [place] de BRCA1 y BRCA2. Cncer de cuello del tero  El mdico puede recomendarle que se haga pruebas peridicas de deteccin de cncer de los rganos de la pelvis (ovarios, tero y vagina). Estas pruebas incluyen un examen plvico, que abarca controlar si se produjeron cambios microscpicos en la superficie del cuello del tero (prueba de Papanicolaou). Pueden recomendarle que se haga estas pruebas cada 3aos, a partir de los 21aos.  A las mujeres que tienen entre 30 y 38aos, los mdicos pueden recomendarles que se sometan a exmenes plvicos y pruebas de Papanicolaou cada 54aos, o a la prueba de Papanicolaou y el examen plvico en combinacin con estudios de deteccin del virus del papiloma humano (VPH) cada 5aos. Algunos tipos de VPH aumentan el riesgo de Chief Financial Officer de cuello del tero. La prueba para la deteccin del VPH tambin puede realizarse a mujeres de cualquier edad cuyos resultados de la prueba de Papanicolaou no sean claros.  Es posible que otros mdicos no recomienden exmenes de deteccin a mujeres no embarazadas que se consideran sujetos de bajo riesgo de Chief Financial Officer de pelvis y que no tienen sntomas. Pregntele al mdico si un examen plvico de deteccin es adecuado para usted.  Si ha  recibido un tratamiento para Science writer cervical o una enfermedad que podra causar cncer, necesitar realizarse una prueba de Papanicolaou y controles durante al menos 54 aos de concluido el [place]. Si no se ha hecho el Papanicolaou con regularidad, debern volver a evaluarse los factores de riesgo (como tener un nuevo compaero sexual), para Teacher, adult education si debe realizarse los estudios nuevamente. Algunas mujeres sufren problemas mdicos que aumentan la probabilidad de Museum/gallery curator cncer de cuello del tero. En estos casos, el mdico podr [employer] se realicen controles y pruebas de Papanicolaou con ms frecuencia. Cncer colorrectal   Este tipo de cncer puede detectarse y a menudo prevenirse.  Por lo general, los estudios de rutina se deben Medical laboratory scientific officer a Field seismologist a Proofreader de los 64 aos y [place] 39 aos.  Sin embargo, el mdico podr aconsejarle que lo haga antes, si tiene factores de riesgo para el cncer de colon.  Tambin puede recomendarle que use un kit de prueba para Hydrologist en la materia fecal.  Es posible que se use una pequea cmara en el extremo de un tubo para examinar directamente el colon (sigmoidoscopia o colonoscopia) a fin de Hydrographic surveyor formas tempranas de cncer colorrectal.  Los exmenes de rutina generalmente comienzan a los 27aos.  El examen directo del colon se debe repetir cada 5 a 10aos hasta los 75aos. Sin embargo, es posible que se realicen exmenes con mayor frecuencia, si se detectan formas tempranas de plipos precancerosos o pequeos bultos. Cncer de piel   Revise la piel de la cabeza a los pies con regularidad.  Informe a su mdico si aparecen nuevos lunares o los que tiene se modifican, especialmente en su forma y color.  Tambin notifique al mdico si tiene un lunar que es ms grande que el tamao de una goma de lpiz.  Siempre use pantalla solar. Aplique pantalla solar de [place] y repetida a lo largo del Training and development officer.  Protjase usando  mangas y [employer], un sombrero de ala ancha y gafas para el sol, siempre que se encuentre en el exterior. ENFERMEDADES CARDACAS, DIABETES E HIPERTENSIN ARTERIAL  La hipertensin arterial causa enfermedades cardacas y [place] el riesgo de ictus. La hipertensin arterial es ms probable en los siguientes casos:  Las personas que tienen la presin arterial en el extremo del rango normal (100-139/85-89 mm Hg).  Las personas con sobrepeso u obesidad.  Las Retail banker.  Si usted tiene entre 18 y 39 aos, debe medirse la presin arterial cada 3 a 5 aos. Si usted tiene 40 aos o ms, debe medirse la presin arterial [employer]. Debe medirse la presin arterial dos veces: una vez cuando est en un hospital o una clnica y la otra vez cuando est en otro sitio. Registre el promedio de [employer]. Para controlar su presin arterial cuando no est en un hospital o [place], puede usar lo siguiente:  Ardelia Mems mquina automtica para medir la presin arterial en una farmacia.  Un monitor para medir la presin arterial en el hogar.  Si tiene entre 74 y 31 aos, consulte a su mdico si debe tomar aspirina para prevenir el ictus.  Realcese exmenes de deteccin de la diabetes con regularidad. Esto incluye la toma de [place] de sangre para controlar el nivel de azcar en la sangre durante el [place].  Si tiene un peso normal y un bajo riesgo de padecer diabetes, realcese este anlisis cada tres aos despus de los 45aos.  Si tiene sobrepeso y un alto riesgo de padecer diabetes, considere someterse a este anlisis antes o con mayor frecuencia. PREVENCIN DE INFECCIONES HepatitisB   Si tiene un riesgo ms alto de Museum/gallery curator hepatitis B, debe someterse a un examen de deteccin de este virus. Se considera que tiene un alto riesgo de Museum/gallery curator hepatitis B si:  Naci en un pas donde la hepatitis B es frecuente. Pregntele a su mdico qu pases son considerados de Recruitment consultant.  Sus padres nacieron en un pas de alto riesgo y usted no recibi una vacuna que lo proteja contra la hepatitis B (vacuna contra la hepatitis B).  [place].  [place] agujas para inyectarse drogas.  Vive con alguien que tiene hepatitis B.  Ha tenido sexo con alguien que tiene hepatitis B.  Recibe tratamiento de hemodilisis.  Toma ciertos medicamentos para el cncer, trasplante de rganos y afecciones autoinmunitarias. Hepatitis C   Se recomienda un anlisis de [place] para:  Todos los que nacieron entre 1945 y [phone].  Todas las personas que tengan un riesgo de haber contrado hepatitis C. Enfermedades de transmisin sexual (ETS).   Debe realizarse pruebas de deteccin de enfermedades de transmisin sexual (ETS), incluidas gonorrea y clamidia si:  Es sexualmente activo y es menor de 24aos.  Es mayor de 24aos, y Investment banker, operational informa que corre riesgo de tener este tipo de infecciones.  La actividad sexual ha cambiado desde que le hicieron la ltima prueba de deteccin y tiene un riesgo mayor de Best boy clamidia o Radio broadcast assistant. Pregntele al mdico si usted tiene riesgo.  Si no tiene el VIH, pero corre riesgo de infectarse por el virus, se recomienda tomar diariamente un medicamento recetado para evitar la infeccin. Esto se conoce como profilaxis previa a la exposicin. Se considera que est en riesgo si:  Es [place] sexualmente y no [place] preservativos habitualmente o no conoce el estado del VIH de sus Advertising copywriter.  Se inyecta drogas.  Es [place] sexualmente con Ardelia Mems pareja que tiene VIH. Consulte a su mdico para saber si tiene un alto riesgo de infectarse por el VIH. Si opta por comenzar la profilaxis previa a la exposicin, primero debe realizarse anlisis de deteccin del VIH. Luego, le harn anlisis cada 46mses mientras est tomando los medicamentos para la profilaxis previa a la exposicin. EEastland Medical Plaza Surgicenter LLC Si es premenopusica y puede quedar e[place] solicite a su  mdico asesoramiento previo a la concepcin.  Si puede quedar embarazada, tome 400 a [id number]([id number]) de cido fAnheuser-Busch  Si desea evitar el embarazo, hable con su mdico sobre el control de la natalidad (anticoncepcin). OSTEOPOROSIS Y MENOPAUSIA  La osteoporosis es una enfermedad en la que los huesos pierden los minerales y la fuerza por el avance de la edad. El resultado pueden ser fracturas graves en los hHanaf[place] El riesgo de osteoporosis puede identificarse con uArdelia Memsprueba de densidad sea.  Si tiene 65aos o ms, o si est en riesgo de sufrir osteoporosis y fracturas, pregunte a su mdico si debe someterse a exmenes.  Consulte a su mdico si debe tomar un suplemento de calcio o de vitamina D para reducir el riesgo de osteoporosis.  La menopausia puede presentar ciertos sntomas fsicos y rGaffer  La terapia de reemplazo hormonal puede reducir algunos de estos sntomas y rGaffer Consulte a su mdico para saber si la terapia de reemplazo hormonal es conveniente para usted. INSTRUCCIONES PARA EL CUIDADO EN EL HOGAR  Realcese los estudios de rutina de la salud, dentales y de lPublic librarian  MSauk [place]  No consuma ningn producto que contenga tabaco, lo que incluye cigarrillos, tabaco de mHigher education careers advisero cPsychologist, sport and exercise  Si est embarazada, no beba alcohol.  Si est amamantando, reduzca el consumo de alcohol y la frecuencia con la que consume.  Si es mujer y no est embarazada limite el consumo de alcohol a no ms de 1 medida por da. Una medida equivale a 12onzas de cerveza, 5onzas de vino o 1onzas de bebidas alcohlicas de alta graduacin.  No consuma drogas.  No comparta agujas.  Solicite ayuda a su mdico si necesita apoyo o informacin para abandonar las drogas.  Informe a su mdico si a menudo se siente deprimido.  Notifique a su mdico si alguna vez ha sido vctima de abuso o si no se siente seguro en su hogar. Esta  informacin no tiene cMarine scientistel consejo del mdico. Asegrese de hacerle al mdico cualquier pregunta que tenga. Document Released: 12/29/2010 Document Revised: 01/30/2014 Document Reviewed: 10/13/2014 Elsevier Interactive Patient Education  2017 EReynolds [employer]

## 2016-02-18 DIAGNOSIS — R0789 Other chest pain: Secondary | ICD-10-CM | POA: Insufficient documentation

## 2016-02-18 NOTE — Assessment & Plan Note (Signed)
Reviewed reassuring cardiac workup last year - anticipate noncardiac source.  She does have reproducible pain at 2nd L costochondral joint and left lower ribcage - suggested aleve PRN for possible costochondritis.

## 2016-02-18 NOTE — Assessment & Plan Note (Signed)
Discussed mother's death and anxiety/mourning. Discussed xanax use.

## 2016-02-18 NOTE — Assessment & Plan Note (Signed)
Preventative protocols reviewed and updated unless pt declined. Discussed healthy diet and lifestyle.  

## 2016-02-18 NOTE — Assessment & Plan Note (Signed)
Chronic, uncontrolled. Reviewed healthy diet changes to improve lipid control.  ASCVD 10 yr risk = 1.7%

## 2016-02-18 NOTE — Assessment & Plan Note (Signed)
Ongoing episodes but pt endorses HR 80s when checked during episode. Will continue to monitor for now.

## 2016-02-22 ENCOUNTER — Encounter: Payer: Self-pay | Admitting: Cardiovascular Disease

## 2016-02-22 ENCOUNTER — Ambulatory Visit (INDEPENDENT_AMBULATORY_CARE_PROVIDER_SITE_OTHER): Payer: Federal, State, Local not specified - PPO | Admitting: Cardiovascular Disease

## 2016-02-22 VITALS — BP 110/72 | HR 99 | Ht 61.0 in | Wt 132.0 lb

## 2016-02-22 DIAGNOSIS — R0789 Other chest pain: Secondary | ICD-10-CM | POA: Diagnosis not present

## 2016-02-22 DIAGNOSIS — F4322 Adjustment disorder with anxiety: Secondary | ICD-10-CM | POA: Diagnosis not present

## 2016-02-22 DIAGNOSIS — R002 Palpitations: Secondary | ICD-10-CM | POA: Diagnosis not present

## 2016-02-22 DIAGNOSIS — E782 Mixed hyperlipidemia: Secondary | ICD-10-CM

## 2016-02-22 NOTE — Progress Notes (Addendum)
Cardiology Office Note  Date:  02/22/2016   ID:  Krista Perez, DOB 1970-03-28, MRN [id number]  PCP:  Eustaquio Boyden, MD   Chief Complaint  Patient presents with  . other    1 year follow up. Pt. c/o chest discomfort, facial numbness and left arm discomfort. Meds reviewed by the pt. verbally.     HPI:  46 year old female  history of palpitations and hyperlipidemia, atypical chest pain Recent loss of her mother to pancreatic cancer, who presents to clinic for follow-up of left-sided chest pain  She is  working locally as a Runner, broadcasting/film/video,   She reports significant stress, lost her mother 1-2 months ago to cancer She is concerned about recent episodes of left-sided chest pain Unclear if there is tenderness to palpation, not clearly associated with exertion, atypical in nature.  Previous workup discussed with her including evaluation in [place] where she had CT scan chest Results requested today and reviewed with her after we received a fax CT coronary calcium score of 0, no other abnormalities noted Chest x-ray also reviewed showing no significant abnormality Date of study was 07/31/2014  She did have routine treadmill stress test last year 2017 that showed no EKG abnormality concerning for ischemia   she is to work out on a regular basis, has not been doing so recently  Lab work reviewed with her as below Lab Results  Component Value Date   CHOL 261 (H) 02/11/2016   HDL 45.50 02/11/2016   LDLCALC 145 (H) 02/09/2015   TRIG 236.0 (H) 02/11/2016   She does not want medications for cholesterol She does take Xanax periodically for anxiety  EKG on today's visit shows normal sinus rhythm with rate 99 bpm, no significant ST or T-wave changes   PMH:   has a past medical history of Chest pain; Generalized headaches; History of hepatitis; migraines; Hyperlipidemia; and Palpitations.  PSH:    Past Surgical History:  Procedure Laterality Date  .  APPENDECTOMY  1990  . BREAST ENHANCEMENT SURGERY    . TONSILLECTOMY  1980    Current Outpatient Prescriptions  Medication Sig Dispense Refill  . ALPRAZolam (XANAX) 0.5 MG tablet TAKE 1 TABLET BY MOUTH EVERY NIGHT AT BEDTIME AS NEEDED. 30 tablet 0  . calcium-vitamin D (OSCAL WITH D) 500-200 MG-UNIT tablet Take 2 tablets by mouth daily.    . Misc Natural Products (OSTEO BI-FLEX ADV JOINT SHIELD PO) Take 1 tablet by mouth daily.    . ranitidine (ZANTAC) 150 MG capsule Take 1 capsule (150 mg total) by mouth 2 (two) times daily. 60 capsule 1   No current facility-administered medications for this visit.      Allergies:   Patient has no known allergies.   Social History:  The patient  reports that she has never smoked. She has never used smokeless tobacco. She reports that she drinks alcohol. She reports that she does not use drugs.   Family History:   family history includes Cancer (age of onset: 35) in her maternal grandfather; Cancer (age of onset: 63) in her maternal grandmother; Other in her brother and father; Pancreatic cancer (age of onset: 82) in her mother.    Review of Systems: Review of Systems  Constitutional: Negative.   Respiratory: Negative.   Cardiovascular: Negative.   Gastrointestinal: Negative.   Musculoskeletal: Negative.   Neurological: Negative.   Psychiatric/Behavioral: Negative.   All other systems reviewed and are negative.    PHYSICAL EXAM: VS:  BP 110/72 (BP  Location: Left Arm, Patient Position: Sitting, Cuff Size: Normal)   Pulse 99   Ht 5\' 1"  (1.549 m)   Wt 132 lb (59.9 kg)   LMP 10/05/2012   BMI 24.94 kg/m  , BMI Body mass index is 24.94 kg/m. GEN: Well nourished, well developed, in no acute distress  HEENT: normal  Neck: no JVD, carotid bruits, or masses Cardiac: RRR; no murmurs, rubs, or gallops,no edema  Respiratory:  clear to auscultation bilaterally, normal work of breathing GI: soft, nontender, nondistended, + BS MS: no deformity or  atrophy  Skin: warm and dry, no rash Neuro:  Strength and sensation are intact Psych: euthymic mood, full affect    Recent Labs: 09/16/2015: Hemoglobin 13.6; Platelets 262.0 02/11/2016: ALT 11; BUN 9; Creatinine, Ser 0.59; Potassium 4.2; Sodium 138; TSH 2.56    Lipid Panel Lab Results  Component Value Date   CHOL 261 (H) 02/11/2016   HDL 45.50 02/11/2016   LDLCALC 145 (H) 02/09/2015   TRIG 236.0 (H) 02/11/2016      Wt Readings from Last 3 Encounters:  02/22/16 132 lb (59.9 kg)  02/17/16 131 lb 4 oz (59.5 kg)  02/04/16 132 lb (59.9 kg)       ASSESSMENT AND PLAN:  Mixed hyperlipidemia Cholesterol discussed with her in detail, given findings on her CT coronary calcium score she prefers to treat this with diet and exercise, no medications at this time. Various medication options discussed with her including over-the-counter options red yeast rice, oatmeal. Also discussed prescription options such as statins as well as non-statin options including Zetia.  Chest pressure - Plan: EKG 12-Lead Chest pain symptoms atypical in nature Possibly exacerbated by recent passing of her mother one month ago CT coronary calcium score and chest x-ray reviewed from 2017, both of which are normal with calcium score 0. Discussed results with her, no further testing needed Should be low risk of severe noncalcified plaque No smoking, no diabetes  Palpitations - Plan: EKG 12-Lead She reports one episode of tachycardia 2 months ago, occasional palpitation Recommended she call our office if she has recurrent symptoms, event monitor could be ordered Possibly brought on by anxiety  Adjustment disorder with anxious mood Continues to have trouble adjusting after recent loss of her mother   Total encounter time more than 25 minutes  Greater than 50% was spent in counseling and coordination of care with the patient   Disposition:   F/U 12 mo as needed   Orders Placed This Encounter  Procedures   . EKG 12-Lead     Signed, Dossie Arbourim Rilen Shukla, M.D., Ph.D. 02/22/2016  [facility] [place] [phone]

## 2016-02-22 NOTE — Patient Instructions (Signed)
Medication Instructions:   No medication changes made  Try Red Yeast Rice for the cholesterol Oatmeal  Zetia by prescription (not a statin), drops 10%  Labwork:  No new labs needed  Testing/Procedures:  No further testing at this time   I recommend watching educational videos on topics of interest to you at:       [email or web address].goemmi.com  Enter code: HEARTCARE    Follow-Up: It was a pleasure seeing you in the office today. Please call us if you have new issues that need to be addressed before your next appt.  [phone]  Your physician wants you to follow-up in: as needed  If you need a refill on your cardiac medications before your next appointment, please call your pharmacy.

## 2016-02-29 ENCOUNTER — Ambulatory Visit: Payer: Federal, State, Local not specified - PPO | Admitting: Cardiovascular Disease

## 2016-05-18 ENCOUNTER — Other Ambulatory Visit: Payer: Self-pay | Admitting: Family Medicine

## 2016-05-18 NOTE — Telephone Encounter (Signed)
Ok to refill? Last filled 02/17/16 #30 0RF

## 2016-05-19 NOTE — Telephone Encounter (Signed)
plz phone in. 

## 2016-05-19 NOTE — Telephone Encounter (Signed)
Rx called in as directed.   

## 2016-09-19 DIAGNOSIS — M542 Cervicalgia: Secondary | ICD-10-CM | POA: Diagnosis not present

## 2016-09-19 DIAGNOSIS — M62838 Other muscle spasm: Secondary | ICD-10-CM | POA: Diagnosis not present

## 2016-09-19 DIAGNOSIS — M9901 Segmental and somatic dysfunction of cervical region: Secondary | ICD-10-CM | POA: Diagnosis not present

## 2016-09-19 DIAGNOSIS — M9902 Segmental and somatic dysfunction of thoracic region: Secondary | ICD-10-CM | POA: Diagnosis not present

## 2016-11-07 ENCOUNTER — Other Ambulatory Visit: Payer: Self-pay | Admitting: Family Medicine

## 2016-11-07 NOTE — Telephone Encounter (Signed)
Last filled:  05/19/16, #30 Last OV (CPE):  02/17/16 Next OV:  none

## 2016-11-07 NOTE — Telephone Encounter (Signed)
Refill left on vm at pharmacy.  

## 2016-11-07 NOTE — Telephone Encounter (Signed)
plz phone in. 

## 2016-11-28 DIAGNOSIS — K080 Exfoliation of teeth due to systemic causes: Secondary | ICD-10-CM | POA: Diagnosis not present

## 2017-01-08 ENCOUNTER — Telehealth: Payer: Self-pay | Admitting: Family Medicine

## 2017-01-08 NOTE — Telephone Encounter (Signed)
Copied from CRM [phone]. 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

## 2017-01-08 NOTE — Telephone Encounter (Signed)
Letter written and in Krista Perez's box I can't write she has an allergy to flu shot as this isn't a true allergy but I will document that it seems to trigger a bad migraine for her and ask her work to take this into consideration.

## 2017-01-09 NOTE — Telephone Encounter (Addendum)
Spoke with pt notifying her the letter is ready to pick up. Also, I relayed message per Dr. Reece AgarG.  Pt says ok and expresses her thanks.  [Placed letter at front office.]

## 2017-01-18 ENCOUNTER — Other Ambulatory Visit: Payer: Self-pay | Admitting: Family Medicine

## 2017-01-18 NOTE — Telephone Encounter (Signed)
Sent electronically 

## 2017-01-18 NOTE — Telephone Encounter (Signed)
Last filled:  11/07/16, #30 Last OV (CPE):  02/17/16 Next OV:  02/27/17

## 2017-02-08 ENCOUNTER — Ambulatory Visit: Payer: Federal, State, Local not specified - PPO | Admitting: Obstetrics & Gynecology

## 2017-02-13 ENCOUNTER — Ambulatory Visit (INDEPENDENT_AMBULATORY_CARE_PROVIDER_SITE_OTHER): Payer: Federal, State, Local not specified - PPO | Admitting: Obstetrics & Gynecology

## 2017-02-13 ENCOUNTER — Encounter: Payer: Self-pay | Admitting: Obstetrics & Gynecology

## 2017-02-13 VITALS — BP 119/81 | HR 76 | Wt 139.0 lb

## 2017-02-13 DIAGNOSIS — N644 Mastodynia: Secondary | ICD-10-CM | POA: Diagnosis not present

## 2017-02-13 DIAGNOSIS — Z01419 Encounter for gynecological examination (general) (routine) without abnormal findings: Secondary | ICD-10-CM

## 2017-02-13 DIAGNOSIS — Z124 Encounter for screening for malignant neoplasm of cervix: Secondary | ICD-10-CM | POA: Diagnosis not present

## 2017-02-13 DIAGNOSIS — Z1151 Encounter for screening for human papillomavirus (HPV): Secondary | ICD-10-CM | POA: Diagnosis not present

## 2017-02-13 DIAGNOSIS — R87610 Atypical squamous cells of undetermined significance on cytologic smear of cervix (ASC-US): Secondary | ICD-10-CM | POA: Insufficient documentation

## 2017-02-13 NOTE — Progress Notes (Signed)
Mammogram 01/2016 Last pap 01/2016

## 2017-02-13 NOTE — Patient Instructions (Signed)
Preventive Care 40-64 Years, Female Preventive care refers to lifestyle choices and visits with your health care provider that can promote health and wellness. What does preventive care include?  A yearly physical exam. This is also called an annual well check.  Dental exams once or twice a year.  Routine eye exams. Ask your health care provider how often you should have your eyes checked.  Personal lifestyle choices, including: ? Daily care of your teeth and gums. ? Regular physical activity. ? Eating a healthy diet. ? Avoiding tobacco and drug use. ? Limiting alcohol use. ? Practicing safe sex. ? Taking low-dose aspirin daily starting at age 58. ? Taking vitamin and mineral supplements as recommended by your health care provider. What happens during an annual well check? The services and screenings done by your health care provider during your annual well check will depend on your age, overall health, lifestyle risk factors, and family history of disease. Counseling Your health care provider may ask you questions about your:  Alcohol use.  Tobacco use.  Drug use.  Emotional well-being.  Home and relationship well-being.  Sexual activity.  Eating habits.  Work and work Statistician.  Method of birth control.  Menstrual cycle.  Pregnancy history.  Screening You may have the following tests or measurements:  Height, weight, and BMI.  Blood pressure.  Lipid and cholesterol levels. These may be checked every 5 years, or more frequently if you are over 81 years old.  Skin check.  Lung cancer screening. You may have this screening every year starting at age 78 if you have a 30-pack-year history of smoking and currently smoke or have quit within the past 15 years.  Fecal occult blood test (FOBT) of the stool. You may have this test every year starting at age 65.  Flexible sigmoidoscopy or colonoscopy. You may have a sigmoidoscopy every 5 years or a colonoscopy  every 10 years starting at age 30.  Hepatitis C blood test.  Hepatitis B blood test.  Sexually transmitted disease (STD) testing.  Diabetes screening. This is done by checking your blood sugar (glucose) after you have not eaten for a while (fasting). You may have this done every 1-3 years.  Mammogram. This may be done every 1-2 years. Talk to your health care provider about when you should start having regular mammograms. This may depend on whether you have a family history of breast cancer.  BRCA-related cancer screening. This may be done if you have a family history of breast, ovarian, tubal, or peritoneal cancers.  Pelvic exam and Pap test. This may be done every 3 years starting at age 80. Starting at age 36, this may be done every 5 years if you have a Pap test in combination with an HPV test.  Bone density scan. This is done to screen for osteoporosis. You may have this scan if you are at high risk for osteoporosis.  Discuss your test results, treatment options, and if necessary, the need for more tests with your health care provider. Vaccines Your health care provider may recommend certain vaccines, such as:  Influenza vaccine. This is recommended every year.  Tetanus, diphtheria, and acellular pertussis (Tdap, Td) vaccine. You may need a Td booster every 10 years.  Varicella vaccine. You may need this if you have not been vaccinated.  Zoster vaccine. You may need this after age 5.  Measles, mumps, and rubella (MMR) vaccine. You may need at least one dose of MMR if you were born in  1957 or later. You may also need a second dose.  Pneumococcal 13-valent conjugate (PCV13) vaccine. You may need this if you have certain conditions and were not previously vaccinated.  Pneumococcal polysaccharide (PPSV23) vaccine. You may need one or two doses if you smoke cigarettes or if you have certain conditions.  Meningococcal vaccine. You may need this if you have certain  conditions.  Hepatitis A vaccine. You may need this if you have certain conditions or if you travel or work in places where you may be exposed to hepatitis A.  Hepatitis B vaccine. You may need this if you have certain conditions or if you travel or work in places where you may be exposed to hepatitis B.  Haemophilus influenzae type b (Hib) vaccine. You may need this if you have certain conditions.  Talk to your health care provider about which screenings and vaccines you need and how often you need them. This information is not intended to replace advice given to you by your health care provider. Make sure you discuss any questions you have with your health care provider. Document Released: 02/05/2015 Document Revised: 09/29/2015 Document Reviewed: 11/10/2014 Elsevier Interactive Patient Education  2018 Elsevier Inc.  

## 2017-02-13 NOTE — Progress Notes (Signed)
GYNECOLOGY ANNUAL PREVENTATIVE CARE ENCOUNTER NOTE  Subjective:   Krista Perez is a 47 y.o. [phone] female here for a routine annual gynecologic exam.  Current complaints: none.   Denies abnormal vaginal bleeding, discharge, pelvic pain, problems with intercourse or other gynecologic concerns.    Gynecologic History Patient's last menstrual period was 10/05/2012. Contraception: none Last Pap: 02/04/2016. Results were: normal with negative HPV Last mammogram: 02/08/2016. Results were: normal  Obstetric History OB History  Gravida Para Term Preterm AB Living  3 1 0 1 2 1   SAB TAB Ectopic Multiple Live Births  2       1    # Outcome Date GA Lbr Len/2nd Weight Sex Delivery Anes PTL Lv  3 Preterm 07/06/07 [id number]   F CS-LTranv   LIV     Complications: Severe preeclampsia  2 SAB           1 SAB               Past Medical History:  Diagnosis Date  . Chest pain    a. 06/2014 - eval in [place] for sharp c/p;  b. 01/2015 ED vist, neg trop.  . Generalized headaches    frequent  . History of hepatitis    a. Hep A after contaminated food, cleared  . Hx of migraines   . Hyperlipidemia    a. 01/2014 TC 215, TG 139, HDL 44, LDL 143.  Marland Kitchen. Palpitations     Past Surgical History:  Procedure Laterality Date  . APPENDECTOMY  1990  . BREAST ENHANCEMENT SURGERY    . TONSILLECTOMY  1980    Current Outpatient Medications on File Prior to Visit  Medication Sig Dispense Refill  . ALPRAZolam (XANAX) 0.5 MG tablet TAKE ONE TABLET BY MOUTH EVERY NIGHT AT BEDTIME AS NEEDED FOR ANXIETYAND/OR SLEEP 30 tablet 0  . calcium-vitamin D (OSCAL WITH D) 500-200 MG-UNIT tablet Take 2 tablets by mouth daily.    . ranitidine (ZANTAC) 150 MG capsule Take 1 capsule (150 mg total) by mouth 2 (two) times daily. 60 capsule 1  . Misc Natural Products (OSTEO BI-FLEX ADV JOINT SHIELD PO) Take 1 tablet by mouth daily.     No current facility-administered medications on file prior to visit.     No Known  Allergies  Social History   Socioeconomic History  . Marital status: Married    Spouse name: Not on file  . Number of children: Not on file  . Years of education: Not on file  . Highest education level: Not on file  Social Needs  . Financial resource strain: Not on file  . Food insecurity - worry: Not on file  . Food insecurity - inability: Not on file  . Transportation needs - medical: Not on file  . Transportation needs - non-medical: Not on file  Occupational History  . Not on file  Tobacco Use  . Smoking status: Never Smoker  . Smokeless tobacco: Never Used  Substance and Sexual Activity  . Alcohol use: Yes    Comment: Rare  . Drug use: No  . Sexual activity: Not Currently    Partners: Male    Birth control/protection: Post-menopausal  Other Topics Concern  . Not on file  Social History Narrative   Caffeine: rare decaf   Lives with husband and daughter (2009) and 1 dog   Occupation: Works as Ecologistmedical interpreter.  Prior lived in [place], husband is [employer] agent   Activity: gym 3x/wk prior to  06/2014 - much less active now.   Diet: cooks at home.  Good water, fruits/vegetables daily    Family History  Problem Relation Age of Onset  . Cancer Maternal Grandfather 50       lung (smoker)  . Cancer Maternal Grandmother 70       leukemia  . Pancreatic cancer Mother 48       Terminal cancer dx in her 69's - still alive, lives in [place].  . Other Father        alive and well, 29, lives in [place], [place]  . Other Brother        alive and well, lives in [place], [place].  . Coronary artery disease Neg Hx   . Stroke Neg Hx   . Diabetes Neg Hx   . Hypertension Neg Hx     The following portions of the patient's history were reviewed and updated as appropriate: allergies, current medications, past family history, past medical history, past social history, past surgical history and problem list.  Review of Systems Pertinent items noted in HPI and remainder of comprehensive ROS otherwise  negative.   Objective:  BP 119/81   Pulse 76   Wt 139 lb (63 kg)   LMP 10/05/2012   BMI 26.26 kg/m  CONSTITUTIONAL: Well-developed, well-nourished female in no acute distress.  HENT:  Normocephalic, atraumatic, External right and left ear normal. Oropharynx is clear and moist EYES: Conjunctivae and EOM are normal. Pupils are equal, round, and reactive to light. No scleral icterus.  NECK: Normal range of motion, supple, no masses.  Normal thyroid.  SKIN: Skin is warm and dry. No rash noted. Not diaphoretic. No erythema. No pallor. NEUROLOGIC: Alert and oriented to person, place, and time. Normal reflexes, muscle tone coordination. No cranial nerve deficit noted. PSYCHIATRIC: Normal mood and affect. Normal behavior. Normal judgment and thought content. CARDIOVASCULAR: Normal heart rate noted, regular rhythm RESPIRATORY: Clear to auscultation bilaterally. Effort and breath sounds normal, no problems with respiration noted. BREASTS: Symmetric in size. No masses, skin changes, nipple drainage, or lymphadenopathy bilaterally. Mild tenderness on left lateral side of breast.  ABDOMEN: Soft, normal bowel sounds, no distention noted.  No tenderness, rebound or guarding.  PELVIC: Normal appearing external genitalia; normal appearing vaginal mucosa and cervix with mild atrophy.  No abnormal discharge noted.  Pap smear obtained.  Normal uterine size, no other palpable masses, no uterine or adnexal tenderness. MUSCULOSKELETAL: Normal range of motion. No tenderness.  No cyanosis, clubbing, or edema.  2+ distal pulses.   Assessment and Plan:  1. Breast pain, left Pain attributed to weight of implants as per patient. Will get studies to rule out any concerning anomaly. Mammogram scheduled. - MM DIAG BREAST W/IMPLANT TOMO BILATERAL; Future - US BREAST LTD UNI LEFT INC AXILLA; Future  2. Encounter for gynecological examination with Papanicolaou smear of cervix - Cytology - PAP Will follow up results of  pap smear and manage accordingly. Routine preventative health maintenance measures emphasized. Please refer to After Visit Summary for other counseling recommendations.    Jaynie Collins, MD, FACOG Obstetrician & Gynecologist, [facility] for [employer], [facility]

## 2017-02-15 ENCOUNTER — Encounter: Payer: Self-pay | Admitting: Family Medicine

## 2017-02-16 LAB — CYTOLOGY - PAP
DIAGNOSIS: UNDETERMINED — AB
HPV (WINDOPATH): NOT DETECTED

## 2017-02-18 ENCOUNTER — Encounter: Payer: Self-pay | Admitting: Obstetrics & Gynecology

## 2017-02-18 ENCOUNTER — Other Ambulatory Visit: Payer: Self-pay | Admitting: Family Medicine

## 2017-02-18 DIAGNOSIS — E782 Mixed hyperlipidemia: Secondary | ICD-10-CM

## 2017-02-18 DIAGNOSIS — M2550 Pain in unspecified joint: Secondary | ICD-10-CM

## 2017-02-19 ENCOUNTER — Other Ambulatory Visit (INDEPENDENT_AMBULATORY_CARE_PROVIDER_SITE_OTHER): Payer: Federal, State, Local not specified - PPO

## 2017-02-19 DIAGNOSIS — E782 Mixed hyperlipidemia: Secondary | ICD-10-CM | POA: Diagnosis not present

## 2017-02-19 DIAGNOSIS — M2550 Pain in unspecified joint: Secondary | ICD-10-CM | POA: Diagnosis not present

## 2017-02-19 LAB — COMPREHENSIVE METABOLIC PANEL
ALK PHOS: 64 U/L (ref 39–117)
ALT: 14 U/L (ref 0–35)
AST: 15 U/L (ref 0–37)
Albumin: 4.3 g/dL (ref 3.5–5.2)
BILIRUBIN TOTAL: 0.5 mg/dL (ref 0.2–1.2)
BUN: 11 mg/dL (ref 6–23)
CALCIUM: 9.3 mg/dL (ref 8.4–10.5)
CO2: 28 meq/L (ref 19–32)
Chloride: 105 mEq/L (ref 96–112)
Creatinine, Ser: 0.53 mg/dL (ref 0.40–1.20)
GFR: 130.95 mL/min (ref >60.00)
Glucose, Bld: 102 mg/dL — ABNORMAL HIGH (ref 70–99)
POTASSIUM: 4.3 meq/L (ref 3.5–5.1)
Sodium: 139 mEq/L (ref 135–145)
Total Protein: 7.4 g/dL (ref 6.0–8.3)

## 2017-02-19 LAB — SEDIMENTATION RATE: Sed Rate: 24 mm/hr — ABNORMAL HIGH (ref 0–20)

## 2017-02-19 LAB — LIPID PANEL
CHOLESTEROL: 222 mg/dL — AB (ref 0–200)
HDL: 41.6 mg/dL (ref >39.00)
LDL Cholesterol: 151 mg/dL — ABNORMAL HIGH (ref 0–99)
NonHDL: 180.14
TRIGLYCERIDES: 144 mg/dL (ref 0.0–149.0)
Total CHOL/HDL Ratio: 5
VLDL: 28.8 mg/dL (ref 0.0–40.0)

## 2017-02-19 LAB — TSH: TSH: 1.39 u[IU]/mL (ref 0.35–4.50)

## 2017-02-20 ENCOUNTER — Encounter: Payer: Federal, State, Local not specified - PPO | Admitting: Family Medicine

## 2017-02-20 ENCOUNTER — Ambulatory Visit
Admission: RE | Admit: 2017-02-20 | Discharge: 2017-02-20 | Disposition: A | Discharge status: Home / Self Care | Payer: Federal, State, Local not specified - PPO | Source: Ambulatory Visit | Attending: Obstetrics & Gynecology | Admitting: Obstetrics & Gynecology

## 2017-02-20 DIAGNOSIS — N644 Mastodynia: Secondary | ICD-10-CM

## 2017-02-20 DIAGNOSIS — N6489 Other specified disorders of breast: Secondary | ICD-10-CM | POA: Diagnosis not present

## 2017-02-20 DIAGNOSIS — R922 Inconclusive mammogram: Secondary | ICD-10-CM | POA: Diagnosis not present

## 2017-02-21 ENCOUNTER — Encounter: Payer: Federal, State, Local not specified - PPO | Admitting: Family Medicine

## 2017-02-26 ENCOUNTER — Telehealth: Payer: Self-pay | Admitting: Family Medicine

## 2017-02-26 NOTE — Telephone Encounter (Signed)
Copied from CRM #[id number]. >> Feb 26, 2017 12:37 PM Raquel SarnaHayes, Teresa G wrote: Pt is wanting to know if Dr. Sharen HonesGutierrez can give pt the name of a good urologist.

## 2017-02-26 NOTE — Telephone Encounter (Signed)
Copied from CRM [phone]. Topic: Inquiry >> Feb 26, 2017 12:37 PM Raquel SarnaHayes, Teresa G wrote: Pt is wanting to know if Dr. Sharen HonesGutierrez can give pt the name of a good urologist.

## 2017-02-26 NOTE — Telephone Encounter (Signed)
I spoke with pt; last night pt had N&V and h/a with dizziness for about 30'. 02/26/17 no N&V, H/A or dizziness. Pt has CPX on 02/27/17 and will discuss with Dr Reece AgarG at that time; if pt condition worsens prior to CPX pt will go to UC if needed. FYI to Dr Reece AgarG.

## 2017-02-27 ENCOUNTER — Encounter: Payer: Self-pay | Admitting: Family Medicine

## 2017-02-27 ENCOUNTER — Ambulatory Visit (INDEPENDENT_AMBULATORY_CARE_PROVIDER_SITE_OTHER): Payer: Federal, State, Local not specified - PPO | Admitting: Family Medicine

## 2017-02-27 VITALS — BP 116/70 | HR 77 | Temp 99.0°F | Ht 61.25 in | Wt 135.5 lb

## 2017-02-27 DIAGNOSIS — M542 Cervicalgia: Secondary | ICD-10-CM | POA: Diagnosis not present

## 2017-02-27 DIAGNOSIS — R42 Dizziness and giddiness: Secondary | ICD-10-CM | POA: Diagnosis not present

## 2017-02-27 DIAGNOSIS — Z5181 Encounter for therapeutic drug level monitoring: Secondary | ICD-10-CM

## 2017-02-27 DIAGNOSIS — Z0001 Encounter for general adult medical examination with abnormal findings: Secondary | ICD-10-CM | POA: Diagnosis not present

## 2017-02-27 DIAGNOSIS — M2550 Pain in unspecified joint: Secondary | ICD-10-CM | POA: Diagnosis not present

## 2017-02-27 DIAGNOSIS — Z780 Asymptomatic menopausal state: Secondary | ICD-10-CM | POA: Diagnosis not present

## 2017-02-27 DIAGNOSIS — G43809 Other migraine, not intractable, without status migrainosus: Secondary | ICD-10-CM | POA: Insufficient documentation

## 2017-02-27 DIAGNOSIS — E782 Mixed hyperlipidemia: Secondary | ICD-10-CM

## 2017-02-27 DIAGNOSIS — F4322 Adjustment disorder with anxiety: Secondary | ICD-10-CM

## 2017-02-27 MED ORDER — RANITIDINE HCL 150 MG PO CAPS: 150.0000 mg | ORAL_CAPSULE | Freq: Two times a day (BID) | ORAL | 6 refills | Status: DC

## 2017-02-27 MED ORDER — CALCIUM-VITAMIN D 600-400 MG-UNIT PO TABS: 1.0000 | ORAL_TABLET | Freq: Every day | ORAL | Status: DC

## 2017-02-27 MED ORDER — ALPRAZOLAM 0.5 MG PO TABS: 0.2500 mg | ORAL_TABLET | Freq: Every evening | ORAL | 1 refills | Status: DC | PRN

## 2017-02-27 NOTE — Assessment & Plan Note (Signed)
Benign exam today. I have asked her to return for labwork when in active flare.

## 2017-02-27 NOTE — Assessment & Plan Note (Signed)
Chronic, stable off meds. Continue healthy diet choices. The 10-year ASCVD risk score Denman George(Goff DC Montez HagemanJr., et al., 2013) is: 1.4%   Values used to calculate the score:     Age: [age] years     Sex: Female     Is Non-Hispanic African American: No     Diabetic: No     Tobacco smoker: No     Systolic Blood Pressure: 116 mmHg     Is BP treated: No     HDL Cholesterol: 41.6 mg/dL     Total Cholesterol: 222 mg/dL
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
## 2017-02-27 NOTE — Assessment & Plan Note (Signed)
Has been recommended against HRT by gyn. Discussed cal/vit D treatment.

## 2017-02-27 NOTE — Assessment & Plan Note (Signed)
Longstanding. Discussed home treatment- gentle stretching and heating pad and topical rubs. She will let me know if desires PT referral.

## 2017-02-27 NOTE — Assessment & Plan Note (Signed)
With insomnia. PRN xanax. Reviewed controlled substance use including risks and benefits. Controlled substance agreement filled out, UDS today.

## 2017-02-27 NOTE — Assessment & Plan Note (Signed)
Preventative protocols reviewed and updated unless pt declined. Discussed healthy diet and lifestyle.  

## 2017-02-27 NOTE — Assessment & Plan Note (Signed)
Recent episode of vertigo - not consistent with labrynthitis. Possible BPV. Now resolved - will monitor and update if recurrence.

## 2017-02-27 NOTE — Patient Instructions (Addendum)
Calcium citrate or carbonate with vitamin D 670m/400 units to take one or twice daily.  Regresar si le duele mucho las articulaciones un dia para cEducation officer, museum  Update UDS today.  Gusto verla hoy regresar en 1 ao para proximo examen fisico. Mantenimiento de la salud - Mujeres (Health Maintenance, Female) Un estilo de vida saludable y los cuidados preventivos pueden favorecer considerablemente a la salud y eMusician Pregunte a su mdico cul es el cronograma de exmenes peridicos apropiado para usted. Esta es una buena oportunidad para consultarlo sobre cmo prevenir enfermedades y mSt. Cloudsano. Adems de los controles, hay muchas otras cosas que puede hacer usted mismo. Los expertos han realizado numerosas investigaciones sArvinMeritorcambios en el estilo de vida y las medidas de prevencin que, mWilder lo ayudarn a mantenerse sano. Solicite a su mdico ms informacin. EL PESO Y LA DIETA Consuma una dieta saludable.  Asegrese de iFamily Dollar Storesverduras, frutas, productos lcteos de bajo contenido de gDjiboutiy pAdvertising account planner  No consuma muchos alimentos de alto contenido de grasas slidas, azcares agregados o sal.  Realice actividad fsica con regularidad. Esta es una de las prcticas ms importantes que puede hacer por su salud. ? La mDelorise Shinerde los adultos deben hacer ejercicio durante al menos 157mutos por semana. El ejercicio debe aumentar la frecuencia cardaca y prActora transpiracin (ejercicio de inP[place] ? La mayora de los adultos tambin deben hacer ejercicios de elongacin al meToysRuseces a la semana. Agregue esto al su plan de ejercicio de intensidad moderada. Mantenga un peso saludable.  El ndice de masa corporal (IAdventist Health Medical Center Tehachapi Valleyes una medida que puede utilizarse para identificar posibles problemas de pePalmyraProporciona una estimacin de la grasa corporal basndose en el peso y la altura. Su mdico puede ayudarle a deRadiation protection practitionerMByron a loScientist, forensic  maTheatre managern peso saludable.  Para las mujeres de 20aos o ms: ? Un IMGastrointestinal Specialists Of Clarksville Pcenor de 18,5 se considera bajo peso. ? Un IMMedical City Mckinneyntre 18,5 y 24,9 es normal. ? Un IMBradley County Medical Centerntre 25 y 29,9 se considera sobrepeso. ? Un IMC de 30 o ms se considera obesidad. Observe los niveles de colesterol y lpidos en la sangre.  Debe comenzar a reEnglish as a second language teachere lpidos y coResearch officer, trade unionn la sangre a los 20aos y luego repetirlos cada 5a17aos Es posible que neAutomotive engineeros niveles de colesterol con mayor frecuencia si: ? Sus niveles de lpidos y colesterol son altos. ? Es mayor de [id number]? Presenta un alto riesgo de padecer enfermedades cardacas. DETECCIN DE CNCER Cncer de pulmn  Se recomienda realizar exmenes de deteccin de cncer de pulmn a personas adultas entre [id number] que estn en riesgo de deHorticulturist, commerciale pulmn por sus antecedentes de consumo de tabaco.  Se recomienda una tomografa computarizada de baja dosis de los pulmones todos los aos a las personas que: ? Fuman actualmente. ? Hayan dejado el hbito en algn momento en los ltimos 15aos. ? Hayan fumado durante 30aos un paquete diario. Un paquete-ao equivale a fumar un promedio de un paquete de cigarrillos diario durante un ao.  Los exmenes de deteccin anuales deben continuar hasta que hayan pasado 15aos desde que dej de fumar.  Ya no debern realizarse si tiene un problema de salud que le impida recibir tratamiento para elScience writere pulmn. Cncer de mama  Practique la autoconciencia de la mama. Esto significa reconocer la apariencia normal de sus mamas y cmo las siente.  Tambin significa realizar autoexmenes  regulares de [employer]. Informe a su mdico sobre cualquier cambio, sin importar cun pequeo sea.  Si tiene entre 20 y 90 aos, un mdico debe realizarle un examen clnico de las mamas como parte del examen regular de [place], cada 1 a 3aos.  Si tiene 40aos o ms, debe Information systems manager  clnico de las Microsoft. Tambin considere realizarse una [place]) todos los [place].  Si tiene antecedentes familiares de cncer de mama, hable con su mdico para someterse a un estudio gentico.  Si tiene alto riesgo de Chief Financial Officer de mama, hable con su mdico para someterse a Public house manager y [employer].  La evaluacin del gen del cncer de mama (BRCA) se recomienda a mujeres que tengan familiares con cnceres relacionados con el BRCA. Los cnceres relacionados con el BRCA incluyen los siguientes: ? [place]. ? Ovario. ? Trompas. ? Cnceres de peritoneo.  Los resultados de la evaluacin determinarn la necesidad de asesoramiento gentico y de [place] de BRCA1 y BRCA2. Cncer de cuello del tero El mdico puede recomendarle que se haga pruebas peridicas de deteccin de cncer de los rganos de la pelvis (ovarios, tero y vagina). Estas pruebas incluyen un examen plvico, que abarca controlar si se produjeron cambios microscpicos en la superficie del cuello del tero (prueba de Papanicolaou). Pueden recomendarle que se haga estas pruebas cada 3aos, a partir de los 21aos.  A las mujeres que tienen entre 30 y 95aos, los mdicos pueden recomendarles que se sometan a exmenes plvicos y pruebas de Papanicolaou cada 32aos, o a la prueba de Papanicolaou y el examen plvico en combinacin con estudios de deteccin del virus del papiloma humano (VPH) cada 5aos. Algunos tipos de VPH aumentan el riesgo de Chief Financial Officer de cuello del tero. La prueba para la deteccin del VPH tambin puede realizarse a mujeres de cualquier edad cuyos resultados de la prueba de Papanicolaou no sean claros.  Es posible que otros mdicos no recomienden exmenes de deteccin a mujeres no embarazadas que se consideran sujetos de bajo riesgo de Chief Financial Officer de pelvis y que no tienen sntomas. Pregntele al mdico si un examen plvico de deteccin es  adecuado para usted.  Si ha recibido un tratamiento para Science writer cervical o una enfermedad que podra causar cncer, necesitar realizarse una prueba de Papanicolaou y controles durante al menos 50 aos de concluido el [place]. Si no se ha hecho el Papanicolaou con regularidad, debern volver a evaluarse los factores de riesgo (como tener un nuevo compaero sexual), para Teacher, adult education si debe realizarse los estudios nuevamente. Algunas mujeres sufren problemas mdicos que aumentan la probabilidad de Museum/gallery curator cncer de cuello del tero. En estos casos, el mdico podr [employer] se realicen controles y pruebas de Papanicolaou con ms frecuencia. Cncer colorrectal  Este tipo de cncer puede detectarse y a menudo prevenirse.  Por lo general, los estudios de rutina se deben Medical laboratory scientific officer a Field seismologist a Proofreader de los 39 aos y [place] 17 aos.  Sin embargo, el mdico podr aconsejarle que lo haga antes, si tiene factores de riesgo para el cncer de colon.  Tambin puede recomendarle que use un kit de prueba para Hydrologist en la materia fecal.  Es posible que se use una pequea cmara en el extremo de un tubo para examinar directamente el colon (sigmoidoscopia o colonoscopia) a fin de Hydrographic surveyor formas tempranas de cncer colorrectal.  Los exmenes de rutina generalmente comienzan a los 75aos.  El examen directo del colon se debe repetir cada 5 a 10aos hasta los 75aos. Sin embargo, es posible que se realicen exmenes con mayor frecuencia, si se detectan formas tempranas de plipos precancerosos o pequeos bultos. Cncer de piel  Revise la piel de la cabeza a los pies con regularidad.  Informe a su mdico si aparecen nuevos lunares o los que tiene se modifican, especialmente en su forma y color.  Tambin notifique al mdico si tiene un lunar que es ms grande que el tamao de una goma de lpiz.  Siempre use pantalla solar. Aplique pantalla solar de Kerry Dory y repetida a lo largo  del Training and development officer.  Protjase usando mangas y [employer], un sombrero de ala ancha y gafas para el sol, siempre que se encuentre en el exterior. ENFERMEDADES CARDACAS, DIABETES E HIPERTENSIN ARTERIAL  La hipertensin arterial causa enfermedades cardacas y [place] el riesgo de ictus. La hipertensin arterial es ms probable en los siguientes casos: ? Las personas que tienen la presin arterial en el extremo del rango normal (100-139/85-89 mm Hg). ? [employer] con sobrepeso u obesidad. ? Scientist, water quality.  Si usted tiene entre 18 y 39 aos, debe medirse la presin arterial cada 3 a 5 aos. Si usted tiene 40 aos o ms, debe medirse la presin arterial [employer]. Debe medirse la presin arterial dos veces: una vez cuando est en un hospital o una clnica y la otra vez cuando est en otro sitio. Registre el promedio de [employer]. Para controlar su presin arterial cuando no est en un hospital o [place], puede usar lo siguiente: ? Jorje Guild automtica para medir la presin arterial en una farmacia. ? Un monitor para medir la presin arterial en el hogar.  Si tiene entre 70 y 50 aos, consulte a su mdico si debe tomar aspirina para prevenir el ictus.  Realcese exmenes de deteccin de la diabetes con regularidad. Esto incluye la toma de [place] de sangre para controlar el nivel de azcar en la sangre durante el [place]. ? Si tiene un peso normal y un bajo riesgo de padecer diabetes, realcese este anlisis cada tres aos despus de los 45aos. ? Si tiene sobrepeso y un alto riesgo de padecer diabetes, considere someterse a este anlisis antes o con mayor frecuencia. PREVENCIN DE INFECCIONES HepatitisB  Si tiene un riesgo ms alto de Museum/gallery curator hepatitis B, debe someterse a un examen de deteccin de este virus. Se considera que tiene un alto riesgo de contraer hepatitis B si: ? Naci en un pas donde la hepatitis B es frecuente. Pregntele a su mdico qu pases  son considerados de Public affairs consultant. ? Sus padres nacieron en un pas de alto riesgo y usted no recibi una vacuna que lo proteja contra la hepatitis B (vacuna contra la hepatitis B). ? [place]. ? [place] agujas para inyectarse drogas. ? Vive con alguien que tiene hepatitis B. ? Ha tenido sexo con alguien que tiene hepatitis B. ? Recibe tratamiento de hemodilisis. ? Toma ciertos medicamentos para el cncer, trasplante de rganos y afecciones autoinmunitarias. Hepatitis C  Se recomienda un anlisis de [place] para: ? [employer] 1945 y 1965. ? Todas las personas que tengan un riesgo de haber contrado hepatitis C. Enfermedades de transmisin sexual (ETS).  Debe realizarse pruebas de deteccin de enfermedades de transmisin sexual (ETS), incluidas gonorrea y clamidia si: ? Es sexualmente activo y es menor de 32PQD. ? Es mayor de 24aos,  y el mdico le informa que corre riesgo de tener este tipo de infecciones. ? La actividad sexual ha cambiado desde que le hicieron la ltima prueba de deteccin y tiene un riesgo mayor de Best boy clamidia o Radio broadcast assistant. Pregntele al mdico si usted tiene riesgo.  Si no tiene el VIH, pero corre riesgo de infectarse por el virus, se recomienda tomar diariamente un medicamento recetado para evitar la infeccin. Esto se conoce como profilaxis previa a la exposicin. Se considera que est en riesgo si: ? Es [place] sexualmente y no [place] preservativos habitualmente o no conoce el estado del VIH de sus Advertising copywriter. ? Se inyecta drogas. ? Es [place] sexualmente con Ardelia Mems pareja que tiene VIH. Consulte a su mdico para saber si tiene un alto riesgo de infectarse por el VIH. Si opta por comenzar la profilaxis previa a la exposicin, primero debe realizarse anlisis de deteccin del VIH. Luego, le harn anlisis cada 77mses mientras est tomando los medicamentos para la profilaxis previa a la exposicin. EMem[facility] Si es premenopusica y puede quedar  eCopan solicite a su mdico asesoramiento previo a la concepcin.  Si puede quedar embarazada, tome 400 a [id number](mcg) de cido fAnheuser-Busch  Si desea evitar el embarazo, hable con su mdico sobre el control de la natalidad (anticoncepcin). OSTEOPOROSIS Y MENOPAUSIA  La osteoporosis es una enfermedad en la que los huesos pierden los minerales y la fuerza por el avance de la edad. El resultado pueden ser fracturas graves en los hMarengo El riesgo de osteoporosis puede identificarse con uArdelia Memsprueba de densidad sea.  Si tiene 65aos o ms, o si est en riesgo de sufrir osteoporosis y fracturas, pregunte a su mdico si debe someterse a exmenes.  Consulte a su mdico si debe tomar un suplemento de calcio o de vitamina D para reducir el riesgo de osteoporosis.  La menopausia puede presentar ciertos sntomas fsicos y rGaffer  La terapia de reemplazo hormonal puede reducir algunos de estos sntomas y rGaffer Consulte a su mdico para saber si la terapia de reemplazo hormonal es conveniente para usted. INSTRUCCIONES PARA EL CUIDADO EN EL HOGAR  Realcese los estudios de rutina de la salud, dentales y de lPublic librarian  MSebree  No consuma ningn producto que contenga tabaco, lo que incluye cigarrillos, tabaco de mHigher education careers advisero cPsychologist, sport and exercise  Si est embarazada, no beba alcohol.  Si est amamantando, reduzca el consumo de alcohol y la frecuencia con la que consume.  Si es mujer y no est embarazada limite el consumo de alcohol a no ms de 1 medida por da. Una medida equivale a 12onzas de cerveza, 5onzas de vino o 1onzas de bebidas alcohlicas de alta graduacin.  No consuma drogas.  No comparta agujas.  Solicite ayuda a su mdico si necesita apoyo o informacin para abandonar las drogas.  Informe a su mdico si a menudo se siente deprimido.  Notifique a su mdico si alguna vez ha sido vctima de abuso o si no se siente seguro  en su hogar. Esta informacin no tiene cMarine scientistel consejo del mdico. Asegrese de hacerle al mdico cualquier pregunta que tenga. Document Released: 12/29/2010 Document Revised: 01/30/2014 Document Reviewed: 10/13/2014 Elsevier Interactive Patient Education  2[employer]

## 2017-02-28 LAB — PAIN MGMT, PROFILE 8 W/CONF, U
6 Acetylmorphine: NEGATIVE ng/mL (ref <10)
ALCOHOL METABOLITES: NEGATIVE ng/mL (ref <500)
Amphetamines: NEGATIVE ng/mL (ref <500)
BENZODIAZEPINES: NEGATIVE ng/mL (ref <100)
BUPRENORPHINE, URINE: NEGATIVE ng/mL (ref <5)
COCAINE METABOLITE: NEGATIVE ng/mL (ref <150)
Creatinine: 117.8 mg/dL
MARIJUANA METABOLITE: NEGATIVE ng/mL (ref <20)
MDMA: NEGATIVE ng/mL (ref <500)
OXYCODONE: NEGATIVE ng/mL (ref <100)
Opiates: NEGATIVE ng/mL (ref <100)
Oxidant: NEGATIVE ug/mL (ref <200)
pH: 6.06 (ref 4.5–9.0)

## 2017-05-03 ENCOUNTER — Ambulatory Visit: Payer: Self-pay | Admitting: *Deleted

## 2017-05-03 ENCOUNTER — Ambulatory Visit: Payer: Federal, State, Local not specified - PPO | Admitting: Family Medicine

## 2017-05-03 ENCOUNTER — Encounter: Payer: Self-pay | Admitting: Family Medicine

## 2017-05-03 VITALS — BP 102/64 | HR 95 | Temp 98.5°F | Resp 12 | Ht 61.25 in | Wt 138.0 lb

## 2017-05-03 DIAGNOSIS — J329 Chronic sinusitis, unspecified: Secondary | ICD-10-CM | POA: Diagnosis not present

## 2017-05-03 MED ORDER — BENZONATATE 200 MG PO CAPS: 200.0000 mg | ORAL_CAPSULE | Freq: Two times a day (BID) | ORAL | 0 refills | Status: DC | PRN

## 2017-05-03 MED ORDER — AMOXICILLIN-POT CLAVULANATE 875-125 MG PO TABS: 1.0000 | ORAL_TABLET | Freq: Two times a day (BID) | ORAL | 0 refills | Status: DC

## 2017-05-03 NOTE — Patient Instructions (Signed)
Start the augmentin and tessalon  Please stay well hydrated.  You can take tylenol and/or motrin as needed for low grade fever and pain.  Please let me know if your symptoms worsen or fail to improve.  Take care, Dr Jimmey RalphParker

## 2017-05-03 NOTE — Progress Notes (Signed)
    Subjective:  Krista Perez is a 47 y.o. female who presents today for same-day appointment with a chief complaint of sinus congestion.   HPI:  Sinus Congestion, Acute Issue Started about 8 days ago. Worsened over the past couple of days.  Associated symptoms include cough, sore throat, and some chills.  No fevers.  She has tried taking ibuprofen and theraflu which have not significantly seemed to help. Also tried mucinex which has helped a little bit.  Symptoms are worse at night.  No other obvious alleviating or aggravating factors.  ROS: Per HPI  PMH: She reports that she has never smoked. She has never used smokeless tobacco. She reports that she drinks alcohol. She reports that she does not use drugs.  Objective:  Physical Exam: BP 102/64 (BP Location: Left Arm)   Pulse 95   Temp 98.5 F (36.9 C)   Resp 12   Ht 5' 1.25" (1.556 m)   Wt 138 lb (62.6 kg)   LMP 10/05/2012   SpO2 97%   BMI 25.86 kg/m   Gen: NAD, resting comfortably HEENT: Right TM with clear effusion.  Left TM clear.  Maxillary sinuses with decreased transillumination bilaterally.  Oropharynx erythematous without exudate. CV: RRR with no murmurs appreciated Pulm: NWOB, CTAB with no crackles, wheezes, or rhonchi  Assessment/Plan:  Sinusitis Start 7-day course of Augmentin.  Start Tessalon for her cough.  Offered Atrovent nasal spray, however patient declined.  Also offered course of prednisone however patient declined.  Discussed conservative measures including good oral hydration and Tylenol/Motrin as needed for pain and fever.  Return precautions reviewed.  Follow-up as needed.  Katina Degreealeb M. Jimmey RalphParker, MD 05/03/2017 4:22 PM

## 2017-05-03 NOTE — Telephone Encounter (Signed)
Patient is calling with sinus pressure and ear pressure as well- she is miserable and does not feel she can wait until her appointment tomorrow- she is willing to see another [facility] provider today. Appointment made. Reason for Disposition . Earache  Answer Assessment - Initial Assessment Questions 1. LOCATION: "Where does it hurt?"      Pressure in head 2. ONSET: "When did the sinus pain start?"  (e.g., hours, days)      1 week- 2 days ago worse 3. SEVERITY: "How bad is the pain?"   (Scale 1-10; mild, moderate or severe)   - MILD (1-3): doesn't interfere with normal activities    - MODERATE (4-7): interferes with normal activities (e.g., work or school) or awakens from sleep   - SEVERE (8-10): excruciating pain and patient unable to do any normal activities        6 4. RECURRENT SYMPTOM: "Have you ever had sinus problems before?" If so, ask: "When was the last time?" and "What happened that time?"      Yes- 4 years ago- antibiotic 5. NASAL CONGESTION: "Is the nose blocked?" If so, ask, "Can you open it or must you breathe through the mouth?"     Yes- OTC medication is helping 6. NASAL DISCHARGE: "Do you have discharge from your nose?" If so ask, "What color?"     Green and thick 7. FEVER: "Do you have a fever?" If so, ask: "What is it, how was it measured, and when did it start?"      Hot and cold spells 8. OTHER SYMPTOMS: "Do you have any other symptoms?" (e.g., sore throat, cough, earache, difficulty breathing)     Cough 9. PREGNANCY: "Is there any chance you are pregnant?" "When was your last menstrual period?"     No- abstaining  Protocols used: SINUS PAIN OR CONGESTION-A-AH

## 2017-05-04 ENCOUNTER — Ambulatory Visit: Payer: Federal, State, Local not specified - PPO | Admitting: Family Medicine

## 2017-05-06 ENCOUNTER — Encounter: Payer: Self-pay | Admitting: Family Medicine

## 2017-05-07 ENCOUNTER — Other Ambulatory Visit: Payer: Self-pay

## 2017-05-07 MED ORDER — PREDNISONE 50 MG PO TABS: 50.0000 mg | ORAL_TABLET | Freq: Every day | ORAL | 0 refills | Status: DC

## 2017-09-27 ENCOUNTER — Other Ambulatory Visit: Payer: Self-pay | Admitting: Family Medicine

## 2017-09-27 NOTE — Telephone Encounter (Signed)
Name of Medication: Alprazolam Name of Pharmacy: [facility] Last Fill or Written Date and Quantity: 07/02/17, #30 Last Office Visit and Type: 05/03/17, acute with Dr. Jimmey Ralph Next Office Visit and Type: none Last Controlled Substance Agreement Date: 02/27/17 Last UDS: 02/27/17

## 2017-09-28 NOTE — Telephone Encounter (Signed)
Eprescribed.

## 2017-10-12 ENCOUNTER — Telehealth: Payer: Self-pay

## 2017-10-12 NOTE — Telephone Encounter (Signed)
Attempted to contact pt. No answer. Vm box is full.  Need to notify pt a copy of her immunizations is ready to pick up.  [Placed copy of immunizations at front office.]

## 2017-10-12 NOTE — Telephone Encounter (Signed)
Copied from CRM [phone]. Topic: Inquiry >> Oct 10, 2017  2:57 PM Lorrine KinMcGee, Demi B, NT wrote: Reason for CRM: Patient is requesting a copy of her immunization records for a new job. Please advise. Patient would like a call back once this is ready for pick up. CB#: [phone]

## 2017-10-12 NOTE — Telephone Encounter (Signed)
Noted.  Printed letter and placed at front office with copy of immunizations.

## 2017-10-12 NOTE — Telephone Encounter (Signed)
Pt states she also needs a copy of the letter from the dr stating she cannot get the flu shot because it gives her migraines. It is in Epic,  dated 01/08/2017 She will pick up with this other.

## 2017-10-12 NOTE — Telephone Encounter (Signed)
Ok to provide requested items.

## 2017-10-17 NOTE — Telephone Encounter (Signed)
High reading means she is immune to these diseases. This is a good thing.

## 2017-10-17 NOTE — Telephone Encounter (Signed)
Pt calling because she is worried because Hep A and Mumps states flagged "high". She wants to know if this is fine to send to her employer. Pt can be reached at [phone]

## 2017-10-17 NOTE — Telephone Encounter (Signed)
Spoke with pt relaying Dr. G's message.  Pt verbalizes understanding and expresses her thanks.  

## 2017-12-04 ENCOUNTER — Encounter: Payer: Self-pay | Admitting: Radiology

## 2017-12-05 ENCOUNTER — Encounter: Payer: Self-pay | Admitting: Radiology

## 2017-12-31 DIAGNOSIS — K080 Exfoliation of teeth due to systemic causes: Secondary | ICD-10-CM | POA: Diagnosis not present

## 2018-02-13 NOTE — Progress Notes (Signed)
GYNECOLOGY ANNUAL PREVENTATIVE CARE ENCOUNTER NOTE  Subjective:   Krista Perez is a 48 y.o. [phone] postmenopausal female here for a routine annual gynecologic exam.  Current complaints: chronic left breast pain occasionally (negative evaluation in past). Also noticed concerning abdominal enlargement/bloating over the last few months. Not much flatus, has tried exercise but it does not go down. Worsened in the last month.   Denies abnormal postmenopausal bleeding, vaginal discharge or other gynecologic concerns.    Gynecologic History Patient's last menstrual period was 10/05/2012. Contraception: post menopausal status Last Pap: 02/13/2017. Results were: ASCUS with negative HPV Last mammogram: 02/20/2017. Results were: normal  Obstetric History OB History  Gravida Para Term Preterm AB Living  3 1 0 1 2 1   SAB TAB Ectopic Multiple Live Births  2       1    # Outcome Date GA Lbr Len/2nd Weight Sex Delivery Anes PTL Lv  3 Preterm 07/06/07 [redacted]w[redacted]d   F CS-LTranv   LIV     Complications: Severe preeclampsia  2 SAB           1 SAB             Past Medical History:  Diagnosis Date  . Chest pain    a. 06/2014 - eval in [place] for sharp c/p;  b. 01/2015 ED vist, neg trop.  . Generalized headaches    frequent  . History of hepatitis    a. Hep A after contaminated food, cleared  . Hx of migraines   . Hyperlipidemia    a. 01/2014 TC 215, TG 139, HDL 44, LDL 143.  Marland Kitchen Palpitations     Past Surgical History:  Procedure Laterality Date  . APPENDECTOMY  1990  . AUGMENTATION MAMMAPLASTY Bilateral 2006  . BREAST ENHANCEMENT SURGERY    . TONSILLECTOMY  1980    Current Outpatient Medications on File Prior to Visit  Medication Sig Dispense Refill  . ALPRAZolam (XANAX) 0.5 MG tablet TAKE 1/2 TO 1 TABLET BY MOUTH AT BEDTIME AS NEEDED FOR SLEEP 30 tablet 0  . Calcium Carb-Cholecalciferol (CALCIUM-VITAMIN D) 600-400 MG-UNIT TABS Take 1 tablet by mouth daily.    . calcium-vitamin D (OSCAL WITH D)  500-200 MG-UNIT tablet Take 2 tablets by mouth daily.    . Misc Natural Products (OSTEO BI-FLEX ADV JOINT SHIELD PO) Take 1 tablet by mouth daily.    Marland Kitchen amoxicillin-clavulanate (AUGMENTIN) 875-125 MG tablet Take 1 tablet by mouth 2 (two) times daily. (Patient not taking: Reported on 02/14/2018) 14 tablet 0  . benzonatate (TESSALON) 200 MG capsule Take 1 capsule (200 mg total) by mouth 2 (two) times daily as needed for cough. (Patient not taking: Reported on 02/14/2018) 20 capsule 0  . predniSONE (DELTASONE) 50 MG tablet Take 1 tablet (50 mg total) by mouth daily with breakfast. (Patient not taking: Reported on 02/14/2018) 2 tablet 0  . ranitidine (ZANTAC) 150 MG capsule Take 1 capsule (150 mg total) by mouth 2 (two) times daily. (Patient not taking: Reported on 02/14/2018) 60 capsule 6   No current facility-administered medications on file prior to visit.     No Known Allergies  Social History:  reports that she has never smoked. She has never used smokeless tobacco. She reports current alcohol use. She reports that she does not use drugs.  Family History  Problem Relation Age of Onset  . Cancer Maternal Grandfather 50       lung (smoker)  . Cancer Maternal Grandmother 68  leukemia  . Pancreatic cancer Mother [id number]       Terminal cancer dx in her 7370's - still alive, [place] in [place]  . Other Father        alive and well, [id number], lives in [place], [place]  . Other Brother        alive and well, lives in [place].  . Coronary artery disease Neg Hx   . Stroke Neg Hx   . Diabetes Neg Hx   . Hypertension Neg Hx     The following portions of the patient's history were reviewed and updated as appropriate: allergies, current medications, past family history, past medical history, past social history, past surgical history and problem list.  Review of Systems Pertinent items noted in HPI and remainder of comprehensive ROS otherwise negative.   Objective:  BP 125/82   Pulse 72   Wt 146 lb (66.2 kg)    LMP 10/05/2012   BMI 27.36 kg/m  CONSTITUTIONAL: Well-developed, well-nourished female in no acute distress.  HENT:  Normocephalic, atraumatic, External right and left ear normal. Oropharynx is clear and moist EYES: Conjunctivae and EOM are normal. Pupils are equal, round, and reactive to light. No scleral icterus.  NECK: Normal range of motion, supple, no masses.  Normal thyroid.  SKIN: Skin is warm and dry. No rash noted. Not diaphoretic. No erythema. No pallor. MUSCULOSKELETAL: Normal range of motion. No tenderness.  No cyanosis, clubbing, or edema.  2+ distal pulses. NEUROLOGIC: Alert and oriented to person, place, and time. Normal reflexes, muscle tone coordination. No cranial nerve deficit noted. PSYCHIATRIC: Normal mood and affect. Normal behavior. Normal judgment and thought content. CARDIOVASCULAR: Normal heart rate noted, regular rhythm RESPIRATORY: Clear to auscultation bilaterally. Effort and breath sounds normal, no problems with respiration noted. BREASTS: Symmetric in size, implants in place. No masses, skin changes, nipple drainage, or lymphadenopathy bilaterally. Mild tenderness on medial side of left breast.  ABDOMEN: Soft, normal bowel sounds, mild-moderate distention noted.  No tenderness, rebound or guarding. No masses able to be palpated. PELVIC: Normal appearing external genitalia; normal appearing vaginal mucosa and cervix.  No abnormal discharge noted.  Pap smear obtained.  Patient was very uncomfortable during the pelvic exam, bimanual exam deferred.    Assessment and Plan:  1. Abdominal distention Unsure of etiology, but will evaluate for gynecologic pathology.  If negative, she may need CT scan or referral to GI for further evaluation. - US PELVIC COMPLETE WITH TRANSVAGINAL; Future  2. Encounter for screening mammogram for breast cancer Screening mammogram ordered [facility] per [facility] recommendations. - MM 3D SCREEN BREAST W/IMPLANT BILATERAL; Future  3. Well  woman exam with routine gynecological exam - Cytology - PAP Will follow up results of pap smear and manage accordingly. Routine preventative health maintenance measures emphasized. Please refer to After Visit Summary for other counseling recommendations.    Jaynie CollinsUGONNA  ANYANWU, MD, FACOG Obstetrician & Gynecologist, [facility], [facility]

## 2018-02-14 ENCOUNTER — Other Ambulatory Visit: Payer: Self-pay | Admitting: Obstetrics & Gynecology

## 2018-02-14 ENCOUNTER — Encounter: Payer: Self-pay | Admitting: Obstetrics & Gynecology

## 2018-02-14 ENCOUNTER — Ambulatory Visit (INDEPENDENT_AMBULATORY_CARE_PROVIDER_SITE_OTHER): Payer: Federal, State, Local not specified - PPO | Admitting: Obstetrics & Gynecology

## 2018-02-14 VITALS — BP 125/82 | HR 72 | Wt 146.0 lb

## 2018-02-14 DIAGNOSIS — R140 Abdominal distension (gaseous): Secondary | ICD-10-CM

## 2018-02-14 DIAGNOSIS — Z01419 Encounter for gynecological examination (general) (routine) without abnormal findings: Secondary | ICD-10-CM | POA: Diagnosis not present

## 2018-02-14 DIAGNOSIS — Z1231 Encounter for screening mammogram for malignant neoplasm of breast: Secondary | ICD-10-CM | POA: Diagnosis not present

## 2018-02-14 DIAGNOSIS — N644 Mastodynia: Secondary | ICD-10-CM

## 2018-02-14 NOTE — Patient Instructions (Signed)
Preventive Care 40-64 Years, Female Preventive care refers to lifestyle choices and visits with your health care provider that can promote health and wellness. What does preventive care include?   A yearly physical exam. This is also called an annual well check.  Dental exams once or twice a year.  Routine eye exams. Ask your health care provider how often you should have your eyes checked.  Personal lifestyle choices, including: ? Daily care of your teeth and gums. ? Regular physical activity. ? Eating a healthy diet. ? Avoiding tobacco and drug use. ? Limiting alcohol use. ? Practicing safe sex. ? Taking low-dose aspirin daily starting at age 50. ? Taking vitamin and mineral supplements as recommended by your health care provider. What happens during an annual well check? The services and screenings done by your health care provider during your annual well check will depend on your age, overall health, lifestyle risk factors, and family history of disease. Counseling Your health care provider may ask you questions about your:  Alcohol use.  Tobacco use.  Drug use.  Emotional well-being.  Home and relationship well-being.  Sexual activity.  Eating habits.  Work and work environment.  Method of birth control.  Menstrual cycle.  Pregnancy history. Screening You may have the following tests or measurements:  Height, weight, and BMI.  Blood pressure.  Lipid and cholesterol levels. These may be checked every 5 years, or more frequently if you are over 50 years old.  Skin check.  Lung cancer screening. You may have this screening every year starting at age 55 if you have a 30-pack-year history of smoking and currently smoke or have quit within the past 15 years.  Colorectal cancer screening. All adults should have this screening starting at age 50 and continuing until age 75. Your health care provider may recommend screening at age 45. You will have tests every  1-10 years, depending on your results and the type of screening test. People at increased risk should start screening at an earlier age. Screening tests may include: ? Guaiac-based fecal occult blood testing. ? Fecal immunochemical test (FIT). ? Stool DNA test. ? Virtual colonoscopy. ? Sigmoidoscopy. During this test, a flexible tube with a tiny camera (sigmoidoscope) is used to examine your rectum and lower colon. The sigmoidoscope is inserted through your anus into your rectum and lower colon. ? Colonoscopy. During this test, a long, thin, flexible tube with a tiny camera (colonoscope) is used to examine your entire colon and rectum.  Hepatitis C blood test.  Hepatitis B blood test.  Sexually transmitted disease (STD) testing.  Diabetes screening. This is done by checking your blood sugar (glucose) after you have not eaten for a while (fasting). You may have this done every 1-3 years.  Mammogram. This may be done every 1-2 years. Talk to your health care provider about when you should start having regular mammograms. This may depend on whether you have a family history of breast cancer.  BRCA-related cancer screening. This may be done if you have a family history of breast, ovarian, tubal, or peritoneal cancers.  Pelvic exam and Pap test. This may be done every 3 years starting at age 21. Starting at age 30, this may be done every 5 years if you have a Pap test in combination with an HPV test.  Bone density scan. This is done to screen for osteoporosis. You may have this scan if you are at high risk for osteoporosis. Discuss your test results, treatment options,   and if necessary, the need for more tests with your health care provider. Vaccines Your health care provider may recommend certain vaccines, such as:  Influenza vaccine. This is recommended every year.  Tetanus, diphtheria, and acellular pertussis (Tdap, Td) vaccine. You may need a Td booster every 10 years.  Varicella  vaccine. You may need this if you have not been vaccinated.  Zoster vaccine. You may need this after age 38.  Measles, mumps, and rubella (MMR) vaccine. You may need at least one dose of MMR if you were born in 1957 or later. You may also need a second dose.  Pneumococcal 13-valent conjugate (PCV13) vaccine. You may need this if you have certain conditions and were not previously vaccinated.  Pneumococcal polysaccharide (PPSV23) vaccine. You may need one or two doses if you smoke cigarettes or if you have certain conditions.  Meningococcal vaccine. You may need this if you have certain conditions.  Hepatitis A vaccine. You may need this if you have certain conditions or if you travel or work in places where you may be exposed to hepatitis A.  Hepatitis B vaccine. You may need this if you have certain conditions or if you travel or work in places where you may be exposed to hepatitis B.  Haemophilus influenzae type b (Hib) vaccine. You may need this if you have certain conditions. Talk to your health care provider about which screenings and vaccines you need and how often you need them. This information is not intended to replace advice given to you by your health care provider. Make sure you discuss any questions you have with your health care provider. Document Released: 02/05/2015 Document Revised: 03/01/2017 Document Reviewed: 11/10/2014 Elsevier Interactive Patient Education  2019 [employer].

## 2018-02-20 ENCOUNTER — Ambulatory Visit (HOSPITAL_COMMUNITY)
Admission: RE | Admit: 2018-02-20 | Discharge: 2018-02-20 | Disposition: A | Discharge status: Home / Self Care | Payer: Federal, State, Local not specified - PPO | Source: Ambulatory Visit | Attending: Obstetrics & Gynecology | Admitting: Obstetrics & Gynecology

## 2018-02-20 DIAGNOSIS — R140 Abdominal distension (gaseous): Secondary | ICD-10-CM

## 2018-02-20 LAB — CYTOLOGY - PAP

## 2018-02-21 ENCOUNTER — Encounter: Payer: Self-pay | Admitting: Family Medicine

## 2018-02-21 ENCOUNTER — Ambulatory Visit: Payer: Federal, State, Local not specified - PPO | Admitting: Family Medicine

## 2018-02-21 ENCOUNTER — Encounter: Payer: Self-pay | Admitting: Radiology

## 2018-02-21 ENCOUNTER — Other Ambulatory Visit: Payer: Federal, State, Local not specified - PPO

## 2018-02-21 VITALS — BP 124/78 | HR 99 | Temp 99.8°F | Resp 12 | Ht 61.25 in | Wt 148.8 lb

## 2018-02-21 DIAGNOSIS — J0100 Acute maxillary sinusitis, unspecified: Secondary | ICD-10-CM

## 2018-02-21 DIAGNOSIS — J029 Acute pharyngitis, unspecified: Secondary | ICD-10-CM

## 2018-02-21 MED ORDER — AMOXICILLIN-POT CLAVULANATE 875-125 MG PO TABS: 1.0000 | ORAL_TABLET | Freq: Two times a day (BID) | ORAL | 0 refills | Status: AC

## 2018-02-21 MED ORDER — PREDNISONE 20 MG PO TABS: 40.0000 mg | ORAL_TABLET | Freq: Every day | ORAL | 0 refills | Status: AC

## 2018-02-21 NOTE — Progress Notes (Signed)
Subjective:     Krista Perez is a 48 y.o. female presenting for Sinus Problem (off and on for 10 days with sinus pain, headache, and now has post nasal drainage, cough, tightness in the throat sensation, chills, runny nose. Has taking Aleve, Zyrtec, Flonase spray.)     Sinus Problem  This is a new problem. The current episode started 1 to 4 weeks ago. The problem has been waxing and waning since onset. The maximum temperature recorded prior to her arrival was 100.4 - 100.9 F. Associated symptoms include chills, congestion, coughing, ear pain, headaches, sinus pressure and a sore throat. Pertinent negatives include no shortness of breath. Past treatments include spray decongestants (NSAIDs, zyrtec). The treatment provided mild relief.     Review of Systems  Constitutional: Positive for chills. Negative for fever.  HENT: Positive for congestion, ear pain, postnasal drip, rhinorrhea, sinus pressure, sinus pain and sore throat.   Respiratory: Positive for cough and chest tightness. Negative for shortness of breath and wheezing.   Cardiovascular: Negative for chest pain and palpitations.  Gastrointestinal: Negative for abdominal pain, nausea and vomiting.  Musculoskeletal: Positive for back pain.  Neurological: Positive for headaches.     Social History   Tobacco Use  Smoking Status Never Smoker  Smokeless Tobacco Never Used        Objective:    BP Readings from Last 3 Encounters:  02/21/18 124/78  02/14/18 125/82  05/03/17 102/64   Wt Readings from Last 3 Encounters:  02/21/18 148 lb 12 oz (67.5 kg)  02/14/18 146 lb (66.2 kg)  05/03/17 138 lb (62.6 kg)    BP 124/78   Pulse 99   Temp 99.8 F (37.7 C)   Resp 12   Ht 5' 1.25" (1.556 m)   Wt 148 lb 12 oz (67.5 kg)   LMP 10/05/2012   SpO2 98%   BMI 27.88 kg/m    Physical Exam Constitutional:      General: She is not in acute distress.    Appearance: She is well-developed. She is not diaphoretic.  HENT:   Head: Normocephalic and atraumatic.     Right Ear: Ear canal normal. A middle ear effusion is present. Tympanic membrane is not erythematous or bulging.     Left Ear: Ear canal normal. A middle ear effusion is present. Tympanic membrane is not erythematous or bulging.     Nose: Mucosal edema and rhinorrhea present.     Right Sinus: Maxillary sinus tenderness present. No frontal sinus tenderness.     Left Sinus: Maxillary sinus tenderness present. No frontal sinus tenderness.     Mouth/Throat:     Pharynx: Uvula midline. Posterior oropharyngeal erythema present. No oropharyngeal exudate.     Tonsils: Swelling: 0 on the right. 0 on the left.  Eyes:     General: No scleral icterus.    Conjunctiva/sclera: Conjunctivae normal.  Neck:     Musculoskeletal: Neck supple.  Cardiovascular:     Rate and Rhythm: Normal rate and regular rhythm.     Heart sounds: Normal heart sounds. No murmur.  Pulmonary:     Effort: Pulmonary effort is normal. No respiratory distress.     Breath sounds: Normal breath sounds.  Lymphadenopathy:     Cervical: No cervical adenopathy.  Skin:    General: Skin is warm and dry.     Capillary Refill: Capillary refill takes less than 2 seconds.  Neurological:     Mental Status: She is alert.  Assessment & Plan:   Problem List Items Addressed This Visit    None    Visit Diagnoses    Acute non-recurrent maxillary sinusitis    -  Primary   Relevant Medications   amoxicillin-clavulanate (AUGMENTIN) 875-125 MG tablet   predniSONE (DELTASONE) 20 MG tablet   Sore throat       Relevant Medications   predniSONE (DELTASONE) 20 MG tablet     Patient previously treated with prednisone for scratchy throat and requesting again.   Advised giving Abx a few days to see if improved and if not then start prednisone  Return if symptoms worsen or fail to improve.  Lynnda Child, MD

## 2018-02-21 NOTE — Patient Instructions (Addendum)
  1. Drink plenty of fluids 2. Get lots of rest  Sinus Congestion 1) Neti Pot (Saline rinse) -- 2 times day -- if tolerated 2) Flonase (Store Brand ok) - once daily 3) Over the counter congestion medications  Cough 1) Cough drops can be helpful 2) Nyquil (or nighttime cough medication) 3) Honey is proven to be one of the best cough medications   Sore Throat 1) Honey as above, cough drops 2) Ibuprofen or Aleve can be helpful 3) Salt water Gargles  If you develop fevers (Temperature >100.4), chills, worsening symptoms or symptoms lasting longer than 10 days return to clinic.    Start with the antibiotics If not improved, use prednisone

## 2018-02-22 ENCOUNTER — Telehealth: Payer: Self-pay

## 2018-02-22 NOTE — Telephone Encounter (Signed)
Patient Name: Krista Perez Gender: Female DOB: 1970/07/09 Age: 48 Y 10 M 2 D Return Phone Number: [phone] (Primary) Address: City/State/Zip: [name] [address] Client [facility] Night - Client Client Site [facility] - Night Physician Eustaquio Boyden - MD Contact Type Call Who Is Calling Patient / Member / Family / Caregiver Call Type Triage / Clinical Relationship To Patient Self Return Phone Number [phone] (Primary) Chief Complaint Fever (non urgent symptom) (> THREE MONTHS) Reason for Call Symptomatic / Request for Health Information Initial Comment Caller states that they are calling about having been to the office today. She was given an antibiotic and she took Tylenol. She is still running a fever of 103.5. Translation No Nurse Assessment Guidelines Guideline Title Affirmed Question Affirmed Notes Nurse Date/Time (Eastern Time) Disp. Time Lamount Cohen Time) Disposition Final User 02/21/2018 11:45:42 PM Attempt made - message left Newhart, RN, Waynetta Sandy 02/21/2018 11:58:37 PM FINAL ATTEMPT MADE - message left Yes Newhart, RN, [employer]

## 2018-02-22 NOTE — Telephone Encounter (Signed)
Tried X 2 to reach patient.  Home number invalid.  Did leave a detailed message on patient's VM (okay per DPR) for patient to please call back and provide an update on how she is doing.

## 2018-02-24 ENCOUNTER — Other Ambulatory Visit: Payer: Self-pay | Admitting: Family Medicine

## 2018-02-24 DIAGNOSIS — E782 Mixed hyperlipidemia: Secondary | ICD-10-CM

## 2018-02-25 NOTE — Telephone Encounter (Signed)
Patient Name: Krista Perez Gender: Female DOB: September 14, 1970 Age: [age] Y 10 M 2 D Return Phone Number: [phone] (Primary) Address: [place]Zip[place] [id number] Client [facility] - Client [facility] - Night Physician Eustaquio BoydenGutierrez, Javier - MD Contact Type Call Who Is Calling Patient / Member / Family / Caregiver Call Type Triage / Clinical Caller Name [facility] Relationship To Patient Self Return Phone Number [phone] (Primary) Chief Complaint Sore Throat Reason for Call Symptomatic / Request for Health Information Initial Comment Caller states that she is having flu symptoms. She has been sneezing, throat hurting and losing her voice. Translation No Nurse Assessment Nurse: Terie Purserooley, RN, Corinne Date/Time (Eastern Time): 02/22/2018 6:14:28 PM Confirm and document reason for call. If symptomatic, describe symptoms. ---Caller states that she is having flu symptoms. She has been sneezing, throat hurting and losing her voice. Saw MD yesterday, Rx antibiotic, started thursday evening. Onset fever greater than 102 that evening after seeing MD. Rochele Pagesook Motrin 35 minutes ago for temp >103, now >102. Does the patient have any new or worsening symptoms? ---Yes Will a triage be completed? ---Yes Related visit to physician within the last 2 weeks? ---Yes Does the PT have any chronic conditions? (i.e. diabetes, asthma, this includes High risk factors for pregnancy, etc.) ---No Is the patient pregnant or possibly pregnant? (Ask all females between the ages of 5312-55) ---No Is this a behavioral health or substance abuse call? ---No Guidelines Guideline Title Affirmed Question Affirmed Notes Nurse Date/Time (Eastern Time) Sinus Infection on Antibiotic Follow-up Call [1] Taking antibiotic < 48 hours AND [2] fever persists Terie PurserDooley, Charity fundraiserN, Corinne 02/22/2018 6:20:59 PM Disp. Time Lamount Cohen(Eastern Time) Disposition Final  User 02/22/2018 6:33:16 PM Home Care Yes Terie Purserooley, RN, Corinne PLEASE NOTE: All timestamps contained within this report are represented as [place] Standard Time. CONFIDENTIALTY NOTICE: This fax transmission is intended only for the addressee. It contains information that is legally privileged, confidential or otherwise protected from use or disclosure. If you are not the intended recipient, you are strictly prohibited from reviewing, disclosing, copying using or disseminating any of this information or taking any action in reliance on or regarding this information. If you have received this fax in error, please notify us immediately by telephone so that we can arrange for its return to us. Phone: [phone], Toll-Free: [phone], Fax: [phone] Page: 2 of 2 Call Id: [id number] Caller Disagree/Comply Comply Caller Understands Yes PreDisposition Did not know what to do Care Advice Given Per Guideline HOME CARE: FOR A STUFFY NOSE - USE NASAL WASHES: * STEP 2: Gently squirt or spray warm salt water into one of your nostrils. CALL BACK IF: * Difficulty breathing (and not relieved by cleaning out nose) * Fever lasts over 2 days on antibiotics * Symptoms don't improve by day 4 on antibiotics * You become worse.

## 2018-02-25 NOTE — Telephone Encounter (Signed)
Noted. On abx for sinusitis.

## 2018-02-26 ENCOUNTER — Other Ambulatory Visit: Payer: Federal, State, Local not specified - PPO

## 2018-02-27 ENCOUNTER — Ambulatory Visit
Admission: RE | Admit: 2018-02-27 | Discharge: 2018-02-27 | Disposition: A | Discharge status: Home / Self Care | Payer: Federal, State, Local not specified - PPO | Source: Ambulatory Visit | Attending: Obstetrics & Gynecology | Admitting: Obstetrics & Gynecology

## 2018-02-27 DIAGNOSIS — N644 Mastodynia: Secondary | ICD-10-CM | POA: Diagnosis not present

## 2018-02-27 DIAGNOSIS — R922 Inconclusive mammogram: Secondary | ICD-10-CM | POA: Diagnosis not present

## 2018-03-04 ENCOUNTER — Encounter: Payer: Federal, State, Local not specified - PPO | Admitting: Family Medicine

## 2018-03-20 ENCOUNTER — Encounter: Payer: Federal, State, Local not specified - PPO | Admitting: Family Medicine

## 2018-03-30 ENCOUNTER — Other Ambulatory Visit: Payer: Self-pay | Admitting: Family Medicine

## 2018-04-01 NOTE — Telephone Encounter (Signed)
E perscribed 

## 2018-04-01 NOTE — Telephone Encounter (Signed)
Name of Medication: Alprazolam Name of Pharmacy: [facility] Last Fill or Written Date and Quantity: 09/28/17, #30 Last Office Visit and Type: 02/21/18, acute w/ Dr. Selena Batten Next Office Visit and Type: 04/10/18, CPE Last Controlled Substance Agreement Date: 02/27/17 Last UDS: 02/27/17

## 2018-04-02 ENCOUNTER — Other Ambulatory Visit: Payer: Federal, State, Local not specified - PPO

## 2018-04-02 ENCOUNTER — Other Ambulatory Visit (INDEPENDENT_AMBULATORY_CARE_PROVIDER_SITE_OTHER): Payer: Federal, State, Local not specified - PPO

## 2018-04-02 DIAGNOSIS — E782 Mixed hyperlipidemia: Secondary | ICD-10-CM | POA: Diagnosis not present

## 2018-04-02 LAB — COMPREHENSIVE METABOLIC PANEL
ALBUMIN: 4.6 g/dL (ref 3.5–5.2)
ALT: 47 U/L — ABNORMAL HIGH (ref 0–35)
AST: 33 U/L (ref 0–37)
Alkaline Phosphatase: 67 U/L (ref 39–117)
BUN: 12 mg/dL (ref 6–23)
CO2: 26 mEq/L (ref 19–32)
Calcium: 9.7 mg/dL (ref 8.4–10.5)
Chloride: 102 mEq/L (ref 96–112)
Creatinine, Ser: 0.6 mg/dL (ref 0.40–1.20)
GFR: 106.28 mL/min (ref >60.00)
Glucose, Bld: 107 mg/dL — ABNORMAL HIGH (ref 70–99)
Potassium: 4.3 mEq/L (ref 3.5–5.1)
Sodium: 137 mEq/L (ref 135–145)
TOTAL PROTEIN: 7.9 g/dL (ref 6.0–8.3)
Total Bilirubin: 0.4 mg/dL (ref 0.2–1.2)

## 2018-04-02 LAB — LIPID PANEL
CHOLESTEROL: 227 mg/dL — AB (ref 0–200)
HDL: 46.3 mg/dL (ref >39.00)
NonHDL: 181.13
Total CHOL/HDL Ratio: 5
Triglycerides: 251 mg/dL — ABNORMAL HIGH (ref 0.0–149.0)
VLDL: 50.2 mg/dL — ABNORMAL HIGH (ref 0.0–40.0)

## 2018-04-02 LAB — LDL CHOLESTEROL, DIRECT: Direct LDL: 164 mg/dL

## 2018-04-02 LAB — TSH: TSH: 4.01 u[IU]/mL (ref 0.35–4.50)

## 2018-04-03 ENCOUNTER — Other Ambulatory Visit: Payer: Federal, State, Local not specified - PPO

## 2018-04-05 ENCOUNTER — Encounter: Payer: Self-pay | Admitting: Family Medicine

## 2018-04-10 ENCOUNTER — Encounter: Payer: Federal, State, Local not specified - PPO | Admitting: Family Medicine

## 2018-05-03 ENCOUNTER — Encounter: Payer: Self-pay | Admitting: Family Medicine

## 2018-06-12 ENCOUNTER — Encounter: Payer: Federal, State, Local not specified - PPO | Admitting: Family Medicine

## 2018-06-13 ENCOUNTER — Encounter: Payer: Self-pay | Admitting: Family Medicine

## 2018-06-13 ENCOUNTER — Other Ambulatory Visit: Payer: Self-pay | Admitting: Family Medicine

## 2018-06-13 ENCOUNTER — Ambulatory Visit (INDEPENDENT_AMBULATORY_CARE_PROVIDER_SITE_OTHER): Payer: Federal, State, Local not specified - PPO | Admitting: Family Medicine

## 2018-06-13 VITALS — BP 144/97 | HR 81 | Ht 61.25 in | Wt 145.0 lb

## 2018-06-13 DIAGNOSIS — Z780 Asymptomatic menopausal state: Secondary | ICD-10-CM

## 2018-06-13 DIAGNOSIS — R740 Nonspecific elevation of levels of transaminase and lactic acid dehydrogenase [LDH]: Secondary | ICD-10-CM

## 2018-06-13 DIAGNOSIS — R7989 Other specified abnormal findings of blood chemistry: Secondary | ICD-10-CM

## 2018-06-13 DIAGNOSIS — Z0001 Encounter for general adult medical examination with abnormal findings: Secondary | ICD-10-CM

## 2018-06-13 DIAGNOSIS — R7401 Elevation of levels of liver transaminase levels: Secondary | ICD-10-CM

## 2018-06-13 DIAGNOSIS — R140 Abdominal distension (gaseous): Secondary | ICD-10-CM

## 2018-06-13 DIAGNOSIS — R739 Hyperglycemia, unspecified: Secondary | ICD-10-CM

## 2018-06-13 DIAGNOSIS — I10 Essential (primary) hypertension: Secondary | ICD-10-CM | POA: Insufficient documentation

## 2018-06-13 DIAGNOSIS — E782 Mixed hyperlipidemia: Secondary | ICD-10-CM | POA: Diagnosis not present

## 2018-06-13 DIAGNOSIS — M542 Cervicalgia: Secondary | ICD-10-CM | POA: Diagnosis not present

## 2018-06-13 MED ORDER — MULTIVITAMIN ADULT PO TABS: 1.0000 | ORAL_TABLET | Freq: Every day | ORAL | Status: DC

## 2018-06-13 MED ORDER — ALPRAZOLAM 0.5 MG PO TABS: 0.2500 mg | ORAL_TABLET | Freq: Every evening | ORAL | 0 refills | Status: DC | PRN
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
## 2018-06-13 NOTE — Assessment & Plan Note (Signed)
Preventative protocols reviewed and updated unless pt declined. Discussed healthy diet and lifestyle.  

## 2018-06-13 NOTE — Assessment & Plan Note (Addendum)
Without red flags.  Recent mild transaminitis - will update labs tomorrow.  Low TSH for age - update tomorrow as well.

## 2018-06-13 NOTE — Assessment & Plan Note (Addendum)
Chronic, off meds. Low ASCVD risk, no fmhx CAD. Encouraged healthy diet for lipid control.  The 10-year ASCVD risk score Denman George DC Montez Hageman., et al., 2013) is: 2.1%   Values used to calculate the score:     Age: 48 years     Sex: Female     Is Non-Hispanic African American: No     Diabetic: No     Tobacco smoker: No     Systolic Blood Pressure: 144 mmHg     Is BP treated: No     HDL Cholesterol: 46.3 mg/dL     Total Cholesterol: 227 mg/dL

## 2018-06-13 NOTE — Assessment & Plan Note (Signed)
With associated R sided HA anticipate cervicogenic or TTH. rec conservative treatment with NSAID, heating pad. Neck strain exercises will be mailed to patient. She will check on insurance coverage for PT and let me know.

## 2018-06-13 NOTE — Assessment & Plan Note (Signed)
She has bought BP cuff. She will start monitoring at home once daily and send me readings in 1 week. Discussed optimal settings for BP check.

## 2018-06-14 ENCOUNTER — Other Ambulatory Visit (INDEPENDENT_AMBULATORY_CARE_PROVIDER_SITE_OTHER): Payer: Federal, State, Local not specified - PPO

## 2018-06-14 DIAGNOSIS — R739 Hyperglycemia, unspecified: Secondary | ICD-10-CM

## 2018-06-14 DIAGNOSIS — R740 Nonspecific elevation of levels of transaminase and lactic acid dehydrogenase [LDH]: Secondary | ICD-10-CM | POA: Diagnosis not present

## 2018-06-14 DIAGNOSIS — R7989 Other specified abnormal findings of blood chemistry: Secondary | ICD-10-CM | POA: Diagnosis not present

## 2018-06-14 DIAGNOSIS — R7401 Elevation of levels of liver transaminase levels: Secondary | ICD-10-CM

## 2018-06-14 DIAGNOSIS — R140 Abdominal distension (gaseous): Secondary | ICD-10-CM

## 2018-06-14 LAB — HEMOGLOBIN A1C: Hgb A1c MFr Bld: 6 % (ref 4.6–6.5)

## 2018-06-14 LAB — CBC WITH DIFFERENTIAL/PLATELET
Basophils Absolute: 0 10*3/uL (ref 0.0–0.1)
Basophils Relative: 0.5 % (ref 0.0–3.0)
Eosinophils Absolute: 0.3 10*3/uL (ref 0.0–0.7)
Eosinophils Relative: 4.2 % (ref 0.0–5.0)
HCT: 42.4 % (ref 36.0–46.0)
Hemoglobin: 14.5 g/dL (ref 12.0–15.0)
Lymphocytes Relative: 32 % (ref 12.0–46.0)
Lymphs Abs: 2 10*3/uL (ref 0.7–4.0)
MCHC: 34.3 g/dL (ref 30.0–36.0)
MCV: 81 fl (ref 78.0–100.0)
Monocytes Absolute: 0.4 10*3/uL (ref 0.1–1.0)
Monocytes Relative: 5.6 % (ref 3.0–12.0)
Neutro Abs: 3.6 10*3/uL (ref 1.4–7.7)
Neutrophils Relative %: 57.7 % (ref 43.0–77.0)
Platelets: 274 10*3/uL (ref 150.0–400.0)
RBC: 5.23 Mil/uL — ABNORMAL HIGH (ref 3.87–5.11)
RDW: 14.5 % (ref 11.5–15.5)
WBC: 6.3 10*3/uL (ref 4.0–10.5)

## 2018-06-14 LAB — COMPREHENSIVE METABOLIC PANEL
ALT: 29 U/L (ref 0–35)
AST: 21 U/L (ref 0–37)
Albumin: 4.3 g/dL (ref 3.5–5.2)
Alkaline Phosphatase: 67 U/L (ref 39–117)
BUN: 9 mg/dL (ref 6–23)
CO2: 28 mEq/L (ref 19–32)
Calcium: 9.7 mg/dL (ref 8.4–10.5)
Chloride: 103 mEq/L (ref 96–112)
Creatinine, Ser: 0.62 mg/dL (ref 0.40–1.20)
GFR: 102.25 mL/min (ref >60.00)
Glucose, Bld: 93 mg/dL (ref 70–99)
Potassium: 4.2 mEq/L (ref 3.5–5.1)
Sodium: 140 mEq/L (ref 135–145)
Total Bilirubin: 0.4 mg/dL (ref 0.2–1.2)
Total Protein: 7.7 g/dL (ref 6.0–8.3)

## 2018-06-14 LAB — LIPASE: Lipase: 27 U/L (ref 11.0–59.0)

## 2018-06-14 LAB — TSH: TSH: 3.8 u[IU]/mL (ref 0.35–4.50)

## 2018-06-14 LAB — T4, FREE: Free T4: 0.81 ng/dL (ref 0.60–1.60)

## 2018-09-09 ENCOUNTER — Encounter: Payer: Self-pay | Admitting: Family Medicine

## 2018-09-09 ENCOUNTER — Telehealth: Payer: Self-pay

## 2018-09-09 ENCOUNTER — Ambulatory Visit (INDEPENDENT_AMBULATORY_CARE_PROVIDER_SITE_OTHER): Payer: Federal, State, Local not specified - PPO | Admitting: Family Medicine

## 2018-09-09 ENCOUNTER — Other Ambulatory Visit: Payer: Self-pay

## 2018-09-09 VITALS — Ht 61.25 in

## 2018-09-09 DIAGNOSIS — H9202 Otalgia, left ear: Secondary | ICD-10-CM | POA: Insufficient documentation

## 2018-09-09 DIAGNOSIS — J029 Acute pharyngitis, unspecified: Secondary | ICD-10-CM | POA: Insufficient documentation

## 2018-09-09 DIAGNOSIS — Z20822 Contact with and (suspected) exposure to covid-19: Secondary | ICD-10-CM

## 2018-09-09 MED ORDER — AMOXICILLIN 875 MG PO TABS: 875.0000 mg | ORAL_TABLET | Freq: Two times a day (BID) | ORAL | 0 refills | Status: DC

## 2018-09-09 NOTE — Assessment & Plan Note (Signed)
Will treat as possible otitis media with 7d amoxicillin course. Discussed ibuprofen and OTC cough syrup use. Update if not improving with treatment.

## 2018-09-09 NOTE — Progress Notes (Signed)
Virtual visit completed through [employer]. Due to national recommendations of social distancing due to COVID-19, a virtual visit is felt to be most appropriate for this patient at this time. Reviewed limitations of a virtual visit.   Patient location: home Provider location: [place] at [facility], office If any vitals were documented, they were collected by patient at home unless specified below.    Ht 5' 1.25" (1.556 m)   LMP 10/05/2012   BMI 27.17 kg/m    CC: ST, chills, ear pain, HA Subjective:    Patient ID: Krista Perez, female    DOB: 04/05/1970, 48 y.o.   MRN: [id number]  HPI: Krista Perez is a 48 y.o. female presenting on 09/09/2018 for Sore Throat (C/o sore throat, chills, bilateral ear pain and HA. )   4d h/o ST, mild cough, L ear pain, chills, HA, mild dyspnea. Mild abd discomfort and body aches. This morning had ear pain 9/10, after taking leftover abx down to 6/10 currently. No loss of hearing.  No loss of taste or smell, abd pain, nausea. No fever.  No sick contacts at home.   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  Non smoker.   No improvement despite allergy treatment. She has also taken ibuprofen 400mg . She tried 40mg  prednisone she had left over. She also tried leftover amoxicillin this morning.      Relevant past medical, surgical, family and social history reviewed and updated as indicated. Interim medical history since our last visit reviewed. Allergies and medications reviewed and updated. Outpatient Medications Prior to Visit  Medication Sig Dispense Refill  . ALPRAZolam (XANAX) 0.5 MG tablet Take 0.5-1 tablets (0.25-0.5 mg total) by mouth at bedtime as needed. for sleep 30 tablet 0  . Multiple Vitamins-Minerals (MULTIVITAMIN ADULT) TABS Take 1 tablet by mouth daily.    . Probiotic Product (PROBIOTIC-10 PO) Take by mouth.     No facility-administered medications prior to visit.     Per HPI unless specifically indicated in ROS section below Review of Systems Objective:    Ht 5' 1.25" (1.556 m)   LMP 10/05/2012   BMI 27.17 kg/m   Wt Readings from Last 3 Encounters:  06/13/18 145 lb (65.8 kg)  02/21/18 148 lb 12 oz (67.5 kg)  02/14/18 146 lb (66.2 kg)     Physical exam: Gen: alert, NAD, not ill appearing Pulm: speaks in complete sentences without increased work of breathing Psych: normal mood, normal thought content      Assessment & Plan:   Problem List Items Addressed This Visit    Sore throat    Of 4d h/o - send for Covid testing given constellation of symptoms in pandemic setting. rec self quarantine at home until results return. See below.       Relevant Orders   Novel Coronavirus, NAA ([facility])   Left ear pain - Primary    Will treat as possible otitis media with 7d amoxicillin course. Discussed ibuprofen and OTC cough syrup use. Update if not improving with treatment.           Meds ordered this encounter  Medications  . amoxicillin (AMOXIL) 875 MG tablet    Sig: Take 1 tablet (875 mg total) by mouth 2 (two) times daily.    Dispense:  14 tablet    Refill:  0   Orders Placed This Encounter  Procedures  . Novel Coronavirus, NAA ([facility])  Order Specific Question:   Is this test for diagnosis or screening    Answer:   Diagnosis of ill patient    Order Specific Question:   Symptomatic for COVID-19 as defined by CDC    Answer:   Yes    Order Specific Question:   Date of Symptom Onset    Answer:   09/06/2018    Order Specific Question:   Hospitalized for COVID-19    Answer:   No    Order Specific Question:   Admitted to ICU for COVID-19    Answer:   No    Order Specific Question:   Previously tested for COVID-19    Answer:   No    Order Specific Question:   Resident in a congregate (group) care setting    Answer:   No    Order Specific Question:   Is the patient student?    Answer:   No    Order Specific Question:   Employed in  healthcare setting    Answer:   No    Order Specific Question:   Pregnant    Answer:   No    I discussed the assessment and treatment plan with the patient. The patient was provided an opportunity to ask questions and all were answered. The patient agreed with the plan and demonstrated an understanding of the instructions. The patient was advised to call back or seek an in-person evaluation if the symptoms worsen or if the condition fails to improve as anticipated.  Follow up plan: No follow-ups on file.  Eustaquio BoydenJavier Jaylaa Gallion, MD

## 2018-09-09 NOTE — Assessment & Plan Note (Signed)
Of 4d h/o - send for Covid testing given constellation of symptoms in pandemic setting. rec self quarantine at home until results return. See below.

## 2018-09-09 NOTE — Telephone Encounter (Signed)
[place] Night - Client TELEPHONE ADVICE RECORD Access[facility]urse Patient Name: Krista Perez Gender: Female DOB: November 28, 1970 Age: 48 Y 41 M 19 D Return Phone Number: [phone] (Primary) Address: City/State/Zip: [place] [address] Client [facility] Night - Client Client Site [place] Physician Ria Bush - MD Contact Type Call Who Is Calling Patient / Member / Family / Caregiver Call Type Triage / Clinical Relationship To Patient Self Return Phone Number [phone] (Primary) Chief Complaint Headache Reason for Call Symptomatic / Request for [name] states she has a sore throat, headache and feel her sore throat in her ear. Translation No Nurse Assessment Nurse: Yolonda Kida, RN, Pricilla Holm Date/Time (Eastern Time): 09/08/2018 8:53:02 PM Confirm and document reason for call. If symptomatic, describe symptoms. ---Caller states she has a sore throat, headache and feel her sore throat in her ear. pain level 4, congested, coughing SOB Has the patient had close contact with a person known or suspected to have the novel coronavirus illness OR traveled / lives in area with major community spread (including international travel) in the last 14 days from the onset of symptoms? * If Asymptomatic, screen for exposure and travel within the last 14 days. ---No Does the patient have any new or worsening symptoms? ---Yes Will a triage be completed? ---Yes Related visit to physician within the last 2 weeks? ---No Does the PT have any chronic conditions? (i.e. diabetes, asthma, this includes High risk factors for pregnancy, etc.) ---No Is the patient pregnant or possibly pregnant? (Ask all females between the ages of 9-55) ---No Is this a behavioral health or substance abuse call? ---No Guidelines Guideline Title Affirmed Question Affirmed Notes Nurse Date/Time  Eilene Ghazi Time) Sore Throat SEVERE (e.g., excruciating) throat pain Yolonda Kida, RN, Pricilla Holm 09/08/2018 8:56:57 PM Disp. Time Eilene Ghazi Time) Disposition Final User 09/08/2018 8:59:58 PM See PCP within 24 Hours Yes Yolonda Kida, RN, Pricilla Holm PLEASE NOTE: All timestamps contained within this report are represented as [place] Standard Time. CONFIDENTIALTY NOTICE: This fax transmission is intended only for the addressee. It contains information that is legally privileged, confidential or otherwise protected from use or disclosure. If you are not the intended recipient, you are strictly prohibited from reviewing, disclosing, copying using or disseminating any of this information or taking any action in reliance on or regarding this information. If you have received this fax in error, please notify us immediately by telephone so that we can arrange for its return to [place]. Phone: [phone], Toll-Free: [phone], Fax: [phone] Page: 2 of 2 Call Id: [id number] [place] Disagree/Comply Comply Caller Understands Yes PreDisposition Call Doctor Care Advice Given Per Guideline SEE PCP WITHIN 24 HOURS: * IF OFFICE WILL BE OPEN: You need to be seen within the next 24 hours. Call your doctor (or NP/PA) when the office opens and make an appointment. SORE THROAT - For relief of sore throat: * Gargle with warm salt water four times a day. To make salt water, put 1/2 teaspoon of salt in 8 oz (240 ml) of warm water. DRINK PLENTY LIQUIDS: * Drink plenty of liquids. This is important to prevent dehydration. CALL BACK IF: * You become worse. CARE ADVICE given per Sore Throat (Adult) guideline. Referrals REFERRED TO PCP OFFICE REFERRED TO PCP OFFICE

## 2018-09-09 NOTE — Telephone Encounter (Signed)
Patient had virtual visit encounter today with PCP.

## 2018-09-10 ENCOUNTER — Encounter: Payer: Self-pay | Admitting: Family Medicine

## 2018-09-10 LAB — NOVEL CORONAVIRUS, NAA: SARS-CoV-2, NAA: NOT DETECTED

## 2018-10-21 ENCOUNTER — Encounter: Payer: Self-pay | Admitting: Family Medicine

## 2018-10-22 ENCOUNTER — Telehealth: Payer: Self-pay | Admitting: Family Medicine

## 2018-10-22 NOTE — Telephone Encounter (Signed)
Error

## 2018-10-22 NOTE — Telephone Encounter (Signed)
Patient called in regards to my chart message., She wanted to verify that it was received

## 2018-12-23 ENCOUNTER — Encounter: Payer: Self-pay | Admitting: Family Medicine

## 2018-12-23 ENCOUNTER — Other Ambulatory Visit: Payer: Self-pay | Admitting: Family Medicine

## 2018-12-23 ENCOUNTER — Telehealth: Payer: Self-pay | Admitting: Radiology

## 2018-12-23 DIAGNOSIS — R059 Cough, unspecified: Secondary | ICD-10-CM

## 2018-12-23 DIAGNOSIS — R05 Cough: Secondary | ICD-10-CM

## 2018-12-23 NOTE — Telephone Encounter (Signed)
I called and spoke to Krista Perez about scheduling a Lab appt. She wanted to know what test she was coming in for, I told her Covid abs, She wanted to know if her insurance covered it which I told her she would have to call her insurance company. Then she wanted the cost if insurance didn't cover. I told her I would try to get that information, I was unable to get the cost. I tried to call her back with this information, no answer no voice mail.

## 2018-12-23 NOTE — Telephone Encounter (Signed)
Lab test ordered [plz schedule lab visit only. Thanks.

## 2018-12-24 NOTE — Telephone Encounter (Signed)
ERx 

## 2018-12-24 NOTE — Telephone Encounter (Signed)
Name of Medication: Alprazolam Name of Pharmacy: [facility] Last Fill or Written Date and Quantity: 04/01/18, #30 Last Office Visit and Type: 09/09/18, acute ear pain Next Office Visit and Type: none Last Controlled Substance Agreement Date: 02/27/17 Last UDS: 02/27/17

## 2018-12-26 ENCOUNTER — Other Ambulatory Visit (INDEPENDENT_AMBULATORY_CARE_PROVIDER_SITE_OTHER): Payer: Federal, State, Local not specified - PPO

## 2018-12-26 ENCOUNTER — Other Ambulatory Visit: Payer: Self-pay

## 2018-12-26 DIAGNOSIS — R059 Cough, unspecified: Secondary | ICD-10-CM

## 2018-12-26 DIAGNOSIS — R05 Cough: Secondary | ICD-10-CM

## 2018-12-27 LAB — SAR COV2 SEROLOGY (COVID19)AB(IGG),IA: SARS CoV2 AB IGG: NEGATIVE

## 2019-01-27 ENCOUNTER — Telehealth: Payer: Self-pay | Admitting: Radiology

## 2019-01-27 NOTE — Telephone Encounter (Signed)
Left message for patient to call [facility] to schedule Annual Exam  

## 2019-02-03 ENCOUNTER — Other Ambulatory Visit: Payer: Self-pay | Admitting: *Deleted

## 2019-02-03 DIAGNOSIS — Z1231 Encounter for screening mammogram for malignant neoplasm of breast: Secondary | ICD-10-CM

## 2019-02-27 ENCOUNTER — Other Ambulatory Visit: Payer: Self-pay

## 2019-02-27 ENCOUNTER — Ambulatory Visit (INDEPENDENT_AMBULATORY_CARE_PROVIDER_SITE_OTHER): Payer: Federal, State, Local not specified - PPO | Admitting: Obstetrics & Gynecology

## 2019-02-27 ENCOUNTER — Encounter: Payer: Self-pay | Admitting: Obstetrics & Gynecology

## 2019-02-27 VITALS — BP 125/85 | HR 71 | Wt 146.0 lb

## 2019-02-27 DIAGNOSIS — Z1151 Encounter for screening for human papillomavirus (HPV): Secondary | ICD-10-CM | POA: Diagnosis not present

## 2019-02-27 DIAGNOSIS — Z01419 Encounter for gynecological examination (general) (routine) without abnormal findings: Secondary | ICD-10-CM | POA: Diagnosis not present

## 2019-02-27 DIAGNOSIS — Z1231 Encounter for screening mammogram for malignant neoplasm of breast: Secondary | ICD-10-CM

## 2019-02-27 DIAGNOSIS — Z124 Encounter for screening for malignant neoplasm of cervix: Secondary | ICD-10-CM

## 2019-02-27 NOTE — Patient Instructions (Signed)

## 2019-02-27 NOTE — Progress Notes (Signed)
GYNECOLOGY ANNUAL PREVENTATIVE CARE ENCOUNTER NOTE  History:     Krista Perez is a 49 y.o. [phone] female here for a routine annual gynecologic exam.  Current complaints: continued chronic left lateral breast pain occasionally (negative evaluation in past).   Denies abnormal vaginal bleeding, discharge, pelvic pain, problems with intercourse or other gynecologic concerns.    Gynecologic History Patient's last menstrual period was 10/05/2012. Contraception: post menopausal status Last Pap: 02/14/2018. Results were: normal with negative HPV Last mammogram: 02/27/2018. Results were: normal  Obstetric History OB History  Gravida Para Term Preterm AB Living  3 1 0 1 2 1   SAB TAB Ectopic Multiple Live Births  2       1    # Outcome Date GA Lbr Len/2nd Weight Sex Delivery Anes PTL Lv  3 Preterm 07/06/07 [redacted]w[redacted]d   F CS-LTranv   LIV     Complications: Severe preeclampsia  2 SAB           1 SAB             Past Medical History:  Diagnosis Date  . Chest pain    a. 06/2014 - eval in [place] for sharp c/p;  b. 01/2015 ED vist, neg trop.  . Generalized headaches    frequent  . History of hepatitis    a. Hep A after contaminated food, cleared  . Hx of migraines   . Hyperlipidemia    a. 01/2014 TC 215, TG 139, HDL 44, LDL 143.  Marland Kitchen Palpitations   . Vaginal Pap smear, abnormal     Past Surgical History:  Procedure Laterality Date  . APPENDECTOMY  1990  . AUGMENTATION MAMMAPLASTY Bilateral 2006  . BREAST ENHANCEMENT SURGERY    . TONSILLECTOMY  1980    Current Outpatient Medications on File Prior to Visit  Medication Sig Dispense Refill  . Multiple Vitamins-Minerals (MULTIVITAMIN ADULT) TABS Take 1 tablet by mouth daily.    . Probiotic Product (PROBIOTIC-10 PO) Take by mouth.    . ALPRAZolam (XANAX) 0.5 MG tablet TAKE 1/2 TO 1 TABLET BY MOUTH AT BEDTIME AS NEEDED FOR SLEEP (Patient not taking: Reported on 02/27/2019) 30 tablet 0  . amoxicillin (AMOXIL) 875 MG tablet Take 1 tablet (875 mg  total) by mouth 2 (two) times daily. (Patient not taking: Reported on 02/27/2019) 14 tablet 0   No current facility-administered medications on file prior to visit.    No Known Allergies  Social History:  reports that she has never smoked. She has never used smokeless tobacco. She reports current alcohol use. She reports that she does not use drugs.  Family History  Problem Relation Age of Onset  . Cancer Maternal Grandfather 50       lung (smoker)  . Cancer Maternal Grandmother 70       leukemia  . Pancreatic cancer Mother 73       Terminal cancer dx in her 39's - still alive, lives in [place].  . Other Father        alive and well, 47, lives in [place], [place]  . Other Brother        alive and well, lives in [place], [place].  . Coronary artery disease Neg Hx   . Stroke Neg Hx   . Diabetes Neg Hx   . Hypertension Neg Hx     The following portions of the patient's history were reviewed and updated as appropriate: allergies, current medications, past family history, past medical history, past social history,  past surgical history and problem list.  Review of Systems Pertinent items noted in HPI and remainder of comprehensive ROS otherwise negative.  Physical Exam:  BP 125/85   Pulse 71   Wt 146 lb (66.2 kg)   LMP 10/05/2012   BMI 27.36 kg/m  CONSTITUTIONAL: Well-developed, well-nourished female in no acute distress.  HENT:  Normocephalic, atraumatic, External right and left ear normal. Oropharynx is clear and moist EYES: Conjunctivae and EOM are normal. Pupils are equal, round, and reactive to light. No scleral icterus.  NECK: Normal range of motion, supple, no masses.  Normal thyroid.  SKIN: Skin is warm and dry. No rash noted. Not diaphoretic. No erythema. No pallor. MUSCULOSKELETAL: Normal range of motion. No tenderness.  No cyanosis, clubbing, or edema.  2+ distal pulses. NEUROLOGIC: Alert and oriented to person, place, and time. Normal reflexes, muscle tone coordination.   PSYCHIATRIC: Normal mood and affect. Normal behavior. Normal judgment and thought content. CARDIOVASCULAR: Normal heart rate noted, regular rhythm RESPIRATORY: Clear to auscultation bilaterally. Effort and breath sounds normal, no problems with respiration noted. BREASTS: Symmetric in size, implants in place. No masses, skin changes, nipple drainage, or lymphadenopathybilaterally.Mild tenderness on lateral side of left breast. ABDOMEN: Soft, no distention noted.  No tenderness, rebound or guarding.  PELVIC: Normal appearing external genitalia and urethral meatus; normal appearing vaginal mucosa and cervix.  No abnormal discharge noted.  Pap smear obtained.  Normal uterine size, no other palpable masses, no uterine or adnexal tenderness.   Assessment and Plan:      1. Encounter for screening mammogram for breast cancer Mammogram scheduled. - Mammogram Screening Bilateral Tomo (implants)  2. Well woman exam with routine gynecological exam - Cytology - PAP( [facility]) Will follow up results of pap smear and manage accordingly. Routine preventative health maintenance measures emphasized. Please refer to After Visit Summary for other counseling recommendations.      Jaynie Collins, MD, FACOG Obstetrician & Gynecologist, [facility] for [employer], [facility]

## 2019-03-03 LAB — CYTOLOGY - PAP
Comment: NEGATIVE
Diagnosis: NEGATIVE
High risk HPV: NEGATIVE

## 2019-03-07 ENCOUNTER — Encounter: Payer: Self-pay | Admitting: Family Medicine

## 2019-03-07 ENCOUNTER — Ambulatory Visit: Payer: Federal, State, Local not specified - PPO | Attending: Internal Medicine

## 2019-03-07 ENCOUNTER — Other Ambulatory Visit: Payer: Federal, State, Local not specified - PPO

## 2019-03-07 DIAGNOSIS — Z20822 Contact with and (suspected) exposure to covid-19: Secondary | ICD-10-CM

## 2019-03-07 NOTE — Telephone Encounter (Signed)
Call to schedule pt for COVID test.  Told pt was tested about 1 hr ago.  FYI to Dr. Reece Agar.

## 2019-03-08 LAB — NOVEL CORONAVIRUS, NAA: SARS-CoV-2, NAA: NOT DETECTED

## 2019-03-11 ENCOUNTER — Encounter: Payer: Self-pay | Admitting: Family Medicine

## 2019-03-11 NOTE — Telephone Encounter (Signed)
Pt c/o L upper back/shoulder pain for several months, which she explains as mild and approximately 3 times per week. Pt denies radiating arm pain, jaw pain or nausea. Pt reports she thinks she had COVID in Feb 2020 and she thinks these symptoms may be related to this. Pt also c/o mild SOB for 3-4 hours, one time a week and can happen during exercise or not during exercise. Pt reports HA/migraine 1-2 times per year and vertigo 1 time per month. She reports vertigo can occur when rising but not always when rising.  Pt denies any current fever, cough, sore throat, runny stuffy nose, N/V/D, or any changes in taste or smell. Pt was tested for covid on 03/07/19 and was negative. Advised pt she should have an OV. Pt agreed and is scheduled for tomorrow with PCP. Advised pt if symptoms worsen or she has any new symptoms to contact this office. Pt verbalized understanding.

## 2019-03-12 ENCOUNTER — Ambulatory Visit: Payer: Federal, State, Local not specified - PPO | Admitting: Family Medicine

## 2019-03-12 ENCOUNTER — Ambulatory Visit (INDEPENDENT_AMBULATORY_CARE_PROVIDER_SITE_OTHER)
Admission: RE | Admit: 2019-03-12 | Discharge: 2019-03-12 | Disposition: A | Discharge status: Home / Self Care | Payer: Federal, State, Local not specified - PPO | Source: Ambulatory Visit | Attending: Family Medicine | Admitting: Family Medicine

## 2019-03-12 ENCOUNTER — Other Ambulatory Visit: Payer: Self-pay

## 2019-03-12 ENCOUNTER — Encounter: Payer: Self-pay | Admitting: Family Medicine

## 2019-03-12 VITALS — BP 124/82 | HR 93 | Temp 97.9°F | Ht 61.25 in | Wt 146.4 lb

## 2019-03-12 DIAGNOSIS — K080 Exfoliation of teeth due to systemic causes: Secondary | ICD-10-CM | POA: Diagnosis not present

## 2019-03-12 DIAGNOSIS — Z8669 Personal history of other diseases of the nervous system and sense organs: Secondary | ICD-10-CM | POA: Diagnosis not present

## 2019-03-12 DIAGNOSIS — R0602 Shortness of breath: Secondary | ICD-10-CM

## 2019-03-12 DIAGNOSIS — G44219 Episodic tension-type headache, not intractable: Secondary | ICD-10-CM

## 2019-03-12 DIAGNOSIS — M542 Cervicalgia: Secondary | ICD-10-CM

## 2019-03-12 DIAGNOSIS — R2681 Unsteadiness on feet: Secondary | ICD-10-CM

## 2019-03-12 MED ORDER — CYCLOBENZAPRINE HCL 5 MG PO TABS: 5.0000 mg | ORAL_TABLET | Freq: Three times a day (TID) | ORAL | 0 refills | Status: DC | PRN
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
## 2019-03-12 NOTE — Telephone Encounter (Signed)
Seen today. 

## 2019-03-12 NOTE — Patient Instructions (Addendum)
Lungs sound clear today.  Neck xray today. I think headache may be tension type headache that comes from neck.  Try flexeril muscle relaxant  Do gentle stretching of neck. Use heating pad to neck when sore.  Let us know if not improving.

## 2019-03-14 DIAGNOSIS — R2681 Unsteadiness on feet: Secondary | ICD-10-CM | POA: Insufficient documentation

## 2019-03-14 DIAGNOSIS — R0600 Dyspnea, unspecified: Secondary | ICD-10-CM | POA: Insufficient documentation

## 2019-03-14 NOTE — Assessment & Plan Note (Signed)
Mild - overall non focal neurological exam. ?cervical spondylosis related - will watch for now.

## 2019-03-14 NOTE — Assessment & Plan Note (Signed)
Notes recent worsening of her headaches - they sound like tension type headache - trial flexeril, gentle neck stretching, heating pad. Update if not better with this. ?occipital neuralgia (see above)
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
## 2019-03-14 NOTE — Assessment & Plan Note (Signed)
Rare migraines, about 1 a year - some vertigo associated - ?vertiginous migraine variant

## 2019-03-14 NOTE — Assessment & Plan Note (Signed)
Intermittent sensation of dyspnea since respiratory infection 02/2018. Lungs clear today, normal vitals including pulse ox. Will monitor for now.

## 2019-03-25 ENCOUNTER — Ambulatory Visit: Payer: Federal, State, Local not specified - PPO | Attending: Internal Medicine

## 2019-03-25 DIAGNOSIS — Z20822 Contact with and (suspected) exposure to covid-19: Secondary | ICD-10-CM

## 2019-03-26 ENCOUNTER — Encounter: Payer: Self-pay | Admitting: Family Medicine

## 2019-03-26 LAB — NOVEL CORONAVIRUS, NAA: SARS-CoV-2, NAA: NOT DETECTED

## 2019-03-26 NOTE — Telephone Encounter (Signed)
Patient left message on triage line about this also. Stated left arm was where she received COVID vaccine about a week ago. Could the pain/sore sensation be coming from that even tough it went away originally or from left back pain issue she saw you last time for? Soreness is radiating down to the left elbow.

## 2019-04-14 ENCOUNTER — Other Ambulatory Visit: Payer: Self-pay | Admitting: Family Medicine

## 2019-04-14 MED ORDER — ALPRAZOLAM 0.5 MG PO TABS: 0.2500 mg | ORAL_TABLET | Freq: Every evening | ORAL | 0 refills | Status: DC | PRN

## 2019-04-14 NOTE — Telephone Encounter (Signed)
Name of Medication: Alprazolam Name of Pharmacy: [facility] Last Fill or Written Date and Quantity: 12/24/18, #30 Last Office Visit and Type: 03/12/19, acute neck pain Next Office Visit and Type: none Last Controlled Substance Agreement Date: 02/27/17 Last UDS: 02/27/17

## 2019-04-14 NOTE — Telephone Encounter (Signed)
ERx 

## 2019-04-16 ENCOUNTER — Ambulatory Visit
Admission: RE | Admit: 2019-04-16 | Discharge: 2019-04-16 | Disposition: A | Discharge status: Home / Self Care | Payer: Federal, State, Local not specified - PPO | Source: Ambulatory Visit | Attending: Obstetrics & Gynecology | Admitting: Obstetrics & Gynecology

## 2019-04-16 ENCOUNTER — Other Ambulatory Visit: Payer: Self-pay

## 2019-04-16 DIAGNOSIS — Z1231 Encounter for screening mammogram for malignant neoplasm of breast: Secondary | ICD-10-CM

## 2019-04-25 ENCOUNTER — Ambulatory Visit: Payer: Federal, State, Local not specified - PPO

## 2019-05-27 ENCOUNTER — Encounter: Payer: Self-pay | Admitting: Family Medicine

## 2019-05-27 NOTE — Telephone Encounter (Signed)
I don't see where pt has taken omeprazole in past.  I do see an old rx for ranitidine.  Do you want me to schedule OV?

## 2019-05-28 MED ORDER — OMEPRAZOLE 20 MG PO CPDR: 20.0000 mg | DELAYED_RELEASE_CAPSULE | Freq: Every day | ORAL | 3 refills | Status: DC

## 2019-05-28 NOTE — Addendum Note (Signed)
Addended by: Eustaquio Boyden on: 05/28/2019 12:04 PM   Modules accepted: Orders

## 2019-06-12 DIAGNOSIS — T8544XA Capsular contracture of breast implant, initial encounter: Secondary | ICD-10-CM | POA: Diagnosis not present

## 2019-06-12 DIAGNOSIS — Z9882 Breast implant status: Secondary | ICD-10-CM | POA: Diagnosis not present

## 2019-06-12 DIAGNOSIS — N644 Mastodynia: Secondary | ICD-10-CM | POA: Diagnosis not present

## 2019-06-24 ENCOUNTER — Other Ambulatory Visit: Payer: Self-pay | Admitting: Family Medicine

## 2019-06-24 NOTE — Telephone Encounter (Signed)
Last office visit 03/12/2019 for Shoulder/neck pain.  Last refilled 04/14/2019 for #30 with no refills.  No future appointments.  UDS/Contract 02/27/2017.

## 2019-06-24 NOTE — Telephone Encounter (Signed)
ERx 

## 2019-09-03 ENCOUNTER — Other Ambulatory Visit: Payer: Self-pay | Admitting: Family Medicine

## 2019-09-03 NOTE — Telephone Encounter (Signed)
ERx 

## 2019-10-13 ENCOUNTER — Encounter: Payer: Self-pay | Admitting: Family Medicine

## 2019-10-17 ENCOUNTER — Encounter: Payer: Self-pay | Admitting: Family Medicine

## 2019-10-17 NOTE — Telephone Encounter (Signed)
Replied via other message.  

## 2019-10-20 ENCOUNTER — Encounter: Payer: Self-pay | Admitting: Family Medicine

## 2019-10-22 ENCOUNTER — Encounter: Payer: Self-pay | Admitting: Family Medicine

## 2019-10-22 DIAGNOSIS — G44219 Episodic tension-type headache, not intractable: Secondary | ICD-10-CM

## 2019-10-22 DIAGNOSIS — Z8669 Personal history of other diseases of the nervous system and sense organs: Secondary | ICD-10-CM

## 2019-10-22 DIAGNOSIS — M542 Cervicalgia: Secondary | ICD-10-CM

## 2019-10-22 NOTE — Telephone Encounter (Signed)
I assume PT referral is for neck pain.  Referrals placed.

## 2019-10-22 NOTE — Telephone Encounter (Signed)
Patient called and said she'd like the referral as soon as possible.  Patient, also, would like to be referred to someone for migraine headache.

## 2019-10-22 NOTE — Telephone Encounter (Signed)
Referrals have been sent  

## 2019-10-31 DIAGNOSIS — Z1159 Encounter for screening for other viral diseases: Secondary | ICD-10-CM | POA: Diagnosis not present

## 2019-10-31 DIAGNOSIS — M542 Cervicalgia: Secondary | ICD-10-CM | POA: Diagnosis not present

## 2019-10-31 DIAGNOSIS — Z20822 Contact with and (suspected) exposure to covid-19: Secondary | ICD-10-CM | POA: Diagnosis not present

## 2019-11-03 DIAGNOSIS — M542 Cervicalgia: Secondary | ICD-10-CM | POA: Diagnosis not present

## 2019-11-07 DIAGNOSIS — M542 Cervicalgia: Secondary | ICD-10-CM | POA: Diagnosis not present

## 2019-11-24 DIAGNOSIS — M542 Cervicalgia: Secondary | ICD-10-CM | POA: Diagnosis not present

## 2019-12-23 DIAGNOSIS — E559 Vitamin D deficiency, unspecified: Secondary | ICD-10-CM | POA: Diagnosis not present

## 2019-12-23 DIAGNOSIS — G43019 Migraine without aura, intractable, without status migrainosus: Secondary | ICD-10-CM | POA: Diagnosis not present

## 2019-12-23 DIAGNOSIS — E538 Deficiency of other specified B group vitamins: Secondary | ICD-10-CM | POA: Diagnosis not present

## 2019-12-23 DIAGNOSIS — G44201 Tension-type headache, unspecified, intractable: Secondary | ICD-10-CM | POA: Diagnosis not present

## 2019-12-24 DIAGNOSIS — G43019 Migraine without aura, intractable, without status migrainosus: Secondary | ICD-10-CM | POA: Diagnosis not present

## 2019-12-24 DIAGNOSIS — G44201 Tension-type headache, unspecified, intractable: Secondary | ICD-10-CM | POA: Diagnosis not present

## 2019-12-24 DIAGNOSIS — E538 Deficiency of other specified B group vitamins: Secondary | ICD-10-CM | POA: Diagnosis not present

## 2019-12-24 DIAGNOSIS — E559 Vitamin D deficiency, unspecified: Secondary | ICD-10-CM | POA: Diagnosis not present

## 2020-01-08 ENCOUNTER — Encounter: Payer: Self-pay | Admitting: Family Medicine

## 2020-01-08 DIAGNOSIS — Z03818 Encounter for observation for suspected exposure to other biological agents ruled out: Secondary | ICD-10-CM | POA: Diagnosis not present

## 2020-01-08 DIAGNOSIS — Z20822 Contact with and (suspected) exposure to covid-19: Secondary | ICD-10-CM

## 2020-01-08 DIAGNOSIS — E782 Mixed hyperlipidemia: Secondary | ICD-10-CM

## 2020-01-08 DIAGNOSIS — Z1152 Encounter for screening for COVID-19: Secondary | ICD-10-CM | POA: Diagnosis not present

## 2020-01-10 ENCOUNTER — Telehealth: Payer: Self-pay

## 2020-01-10 ENCOUNTER — Encounter: Payer: Self-pay | Admitting: Family Medicine

## 2020-01-10 DIAGNOSIS — Z20822 Contact with and (suspected) exposure to covid-19: Secondary | ICD-10-CM | POA: Diagnosis not present

## 2020-01-10 NOTE — Telephone Encounter (Signed)
Pt tested + on home covid test. Pt requesting to have monoclonal infusion. Advised pt that will call their VM and LM with her demographic information.  Called monoclonal antibody infusion hotline and LM on VM. SX started 01/09/20.

## 2020-01-11 ENCOUNTER — Other Ambulatory Visit: Payer: Self-pay | Admitting: Physician Assistant

## 2020-01-11 DIAGNOSIS — U071 COVID-19: Secondary | ICD-10-CM

## 2020-01-11 DIAGNOSIS — I10 Essential (primary) hypertension: Secondary | ICD-10-CM

## 2020-01-11 DIAGNOSIS — Z6828 Body mass index (BMI) 28.0-28.9, adult: Secondary | ICD-10-CM

## 2020-01-11 NOTE — Progress Notes (Signed)
I connected by phone with Pixie Casino on 01/11/2020 at 7:02 PM to discuss the potential use of a new treatment for mild to moderate COVID-19 viral infection in non-hospitalized patients.  This patient is a 49 y.o. female that meets the FDA criteria for Emergency Use Authorization of COVID monoclonal antibody sotrovimab, casirivimab/imdevimab or bamlamivimab/estevimab.  Has a (+) direct SARS-CoV-2 viral test result  Has mild or moderate COVID-19   Is NOT hospitalized due to COVID-19  Is within 10 days of symptom onset  Has at least one of the high risk factor(s) for progression to severe COVID-19 and/or hospitalization as defined in EUA.  Specific high risk criteria : BMI > 25   I have spoken and communicated the following to the patient or parent/caregiver regarding COVID monoclonal antibody treatment:  1. FDA has authorized the emergency use for the treatment of mild to moderate COVID-19 in adults and pediatric patients with positive results of direct SARS-CoV-2 viral testing who are 17 years of age and older weighing at least 40 kg, and who are at high risk for progressing to severe COVID-19 and/or hospitalization.  2. The significant known and potential risks and benefits of COVID monoclonal antibody, and the extent to which such potential risks and benefits are unknown.  3. Information on available alternative treatments and the risks and benefits of those alternatives, including clinical trials.  4. Patients treated with COVID monoclonal antibody should continue to self-isolate and use infection control measures (e.g., wear mask, isolate, social distance, avoid sharing personal items, clean and disinfect "high touch" surfaces, and frequent handwashing) according to CDC guidelines.   5. The patient or parent/caregiver has the option to accept or refuse COVID monoclonal antibody treatment.  After reviewing this information with the patient, the patient has agreed to receive one of  the available covid 19 monoclonal antibodies and will be provided an appropriate fact sheet prior to infusion.  Sx onset 12/18. Set up for infusion on 12/21 @ 4:30pm. Directions given to [facility]. Pt is aware that insurance will be charged an infusion fee. Pt is fully vaccinated. + home test.   Cline Crock 01/11/2020 7:02 PM

## 2020-01-12 ENCOUNTER — Encounter: Payer: Self-pay | Admitting: Family Medicine

## 2020-01-12 ENCOUNTER — Encounter: Payer: Self-pay | Admitting: Radiology

## 2020-01-12 ENCOUNTER — Telehealth (INDEPENDENT_AMBULATORY_CARE_PROVIDER_SITE_OTHER): Payer: Federal, State, Local not specified - PPO | Admitting: Family Medicine

## 2020-01-12 ENCOUNTER — Telehealth: Payer: Self-pay

## 2020-01-12 ENCOUNTER — Other Ambulatory Visit: Payer: Self-pay

## 2020-01-12 DIAGNOSIS — U071 COVID-19: Secondary | ICD-10-CM

## 2020-01-12 DIAGNOSIS — Z20822 Contact with and (suspected) exposure to covid-19: Secondary | ICD-10-CM | POA: Diagnosis not present

## 2020-01-12 HISTORY — DX: COVID-19: U07.1

## 2020-01-12 MED ORDER — FLUTICASONE PROPIONATE 50 MCG/ACT NA SUSP: 2.0000 | Freq: Every day | NASAL | 1 refills | Status: DC

## 2020-01-12 NOTE — Telephone Encounter (Signed)
[facility] Night - Client TELEPHONE ADVICE RECORD [facility] Patient Name: Krista Perez Gender: Female DOB: 1970-06-29 Age: 49 Y 8 M 20 D Return Phone Number: [phone] (Primary), [phone] (Secondary) Address: [place] [address] Client [facility] Night - Client Client Site [facility] - Night Physician Eustaquio Boyden - MD Contact Type Call Who Is Calling Patient / Member / Family / Caregiver Call Type Triage / Clinical Relationship To Patient Self Return Phone Number [phone] (Primary) Chief Complaint Fever (non urgent symptom) (> THREE MONTHS) Reason for Call Symptomatic / Request for Health Information Initial Comment Caller states she tested positive for COVID today and has a fever. Translation No Nurse Assessment Nurse: Solocinski, RN, Beth Date/Time (Eastern Time): 01/10/2020 9:56:29 PM Confirm and document reason for call. If symptomatic, describe symptoms. ---Caller states she tested positive for covid today. Caller states she nasal congestion, fever 101-102, cough and muscle aches. Does the patient have any new or worsening symptoms? ---Yes Will a triage be completed? ---Yes Related visit to physician within the last 2 weeks? ---No Does the PT have any chronic conditions? (i.e. diabetes, asthma, this includes High risk factors for pregnancy, etc.) ---Yes List chronic conditions. ---hyperlipidemia Is the patient pregnant or possibly pregnant? (Ask all females between the ages of 39-55) ---No Is this a behavioral health or substance abuse call? ---No Guidelines Guideline Title Affirmed Question Affirmed Notes Nurse Date/Time (Eastern Time) COVID-19 - Diagnosed or Suspected MILD difficulty breathing (e.g., minimal/no SOB at rest, SOB with walking, pulse <100) Solocinski, RN, Beth 01/10/2020 9:59:31 PM Disp. Time Lamount Cohen Time) Disposition Final User 01/10/2020  10:06:19 PM See HCP within 4 Hours (or PCP triage) Yes Solocinski, RN, Beth PLEASE NOTE: All timestamps contained within this report are represented as [place] Standard Time. CONFIDENTIALTY NOTICE: This fax transmission is intended only for the addressee. It contains information that is legally privileged, confidential or otherwise protected from use or disclosure. If you are not the intended recipient, you are strictly prohibited from reviewing, disclosing, copying using or disseminating any of this information or taking any action in reliance on or regarding this information. If you have received this fax in error, please notify us immediately by telephone so that we can arrange for its return to [place]. Phone: [phone], Toll-Free: [phone], Fax: [phone] Page: 2 of 2 Call Id: [id number] Caller Disagree/Comply Comply Caller Understands Yes PreDisposition Did not know what to do Care Advice Given Per Guideline SEE HCP (OR PCP TRIAGE) WITHIN 4 HOURS: * IF OFFICE WILL BE CLOSED AND NO PCP (PRIMARY CARE PROVIDER) SECOND-LEVEL TRIAGE: You need to be seen within the next 3 or 4 hours. A nearby Urgent Care Center [facility]) is often a good source of care. Another choice is to go to the ED. Go sooner if you become worse. CALL BACK IF: * You become worse CARE ADVICE given per COVID-19 - DIAGNOSED OR SUSPECTED (Adult) guideline. NOTE TO TRIAGER - IF NO PCP, IF AVAILABLE HAVE OTHER DOCTOR (OR NP/PA) RE-TRIAGE THE PATIENT: * During this COVID-19 pandemic, the medical community is trying to prevent unnecessary referrals to the emergency department (ED). Some patients are fearful of being exposed to COVID-19 in a medical setting. Second-level triage (re-triage) by a doctor (or NP/PA) has been shown to reduce ED referrals. Here are resources that may be available in your community. Referrals GO TO FACILITY UNDECIDED  Patient has a virtual visit with Dr. Sharen Hones today (01/12/2020) at 12:30.

## 2020-01-12 NOTE — Telephone Encounter (Signed)
Please offer virtual appt at 12:30pm today

## 2020-01-12 NOTE — Progress Notes (Signed)
Patient ID: Krista Perez, female    DOB: 28-Nov-1970, 49 y.o.   MRN: [id number]  Virtual visit completed through MyChart, a video enabled telemedicine application. Due to national recommendations of social distancing due to COVID-19, a virtual visit is felt to be most appropriate for this patient at this time. Reviewed limitations, risks, security and privacy concerns of performing a virtual visit and the availability of in person appointments. I also reviewed that there may be a patient responsible charge related to this service. The patient agreed to proceed.   Patient location: home Provider location: [place] at [facility], office Persons participating in this virtual visit: patient, provider   If any vitals were documented, they were collected by patient at home unless specified below.    BP 125/82   Pulse 99   Temp 99.5 F (37.5 C)   Ht 5' 1.25" (1.556 m)   LMP 10/05/2012   SpO2 97%   BMI 27.44 kg/m    CC: Covid infection Subjective:   HPI: Krista Perez is a 49 y.o. female presenting on 01/12/2020 for Fever (C/o fever- max 102.5, facial pain and HA.  Sxs started 01/09/20.  Tested positive for COVID on home test on 01/10/20.  Taking Tylenol, which is helping with fever. )   First day of symptoms 01/09/2020  Tested positive using home test 01/10/2020.  Has been scheduled monoclonal antibody infusion for tomorrow 01/13/2020. Husband recently sick with COVID19 as well. Michaiah did receive Pfizer vaccine x2 (02/2019, 03/2019).   Tmax 102.5, along with sinus pressure headache, nasal pressure, nasal congestion, largely sinusitis symptoms. Body aches.   No chest congestion, cough, dyspnea, nausea, diarrhea, abd pain.   Taking tylenol/ibuprofen with benefit.       Relevant past medical, surgical, family and social history reviewed and updated as indicated. Interim medical history since our last visit reviewed. Allergies and medications reviewed and updated. Outpatient  Medications Prior to Visit  Medication Sig Dispense Refill  . ALPRAZolam (XANAX) 0.5 MG tablet TAKE 0.5-1 TABLETS (0.25-0.5 MG TOTAL) BY MOUTH AT BEDTIME AS NEEDED. FOR SLEEP 30 tablet 0  . cyclobenzaprine (FLEXERIL) 5 MG tablet Take 1 tablet (5 mg total) by mouth 3 (three) times daily as needed for muscle spasms (sedation precautions). 30 tablet 0  . Multiple Vitamins-Minerals (MULTIVITAMIN ADULT) TABS Take 1 tablet by mouth daily.    . Probiotic Product (PROBIOTIC-10 PO) Take by mouth.    Marland Kitchen omeprazole (PRILOSEC) 20 MG capsule Take 1 capsule (20 mg total) by mouth daily. For 3 weeks then as needed for heartburn 30 capsule 3   No facility-administered medications prior to visit.     Per HPI unless specifically indicated in ROS section below Review of Systems Objective:  BP 125/82   Pulse 99   Temp 99.5 F (37.5 C)   Ht 5' 1.25" (1.556 m)   LMP 10/05/2012   SpO2 97%   BMI 27.44 kg/m   Wt Readings from Last 3 Encounters:  03/12/19 146 lb 7 oz (66.4 kg)  02/27/19 146 lb (66.2 kg)  06/13/18 145 lb (65.8 kg)       Physical exam: Gen: alert, NAD, tired but not toxic appearing Pulm: speaks in complete sentences without increased work of breathing Psych: normal mood, normal thought content      Results for orders placed or performed in visit on 03/25/19  Novel Coronavirus, NAA ([facility])   Specimen: Nasopharyngeal(NP) swabs in vial transport medium   NASOPHARYNGE  TESTING  Result Value Ref  Range   SARS-CoV-2, NAA Not Detected Not Detected   Assessment & Plan:   Problem List Items Addressed This Visit    COVID-19 virus infection    Positive swab at home. She is vaccinated x2. Largely upper respiratory/sinus symptoms. Reviewed anticipated course of recovery. She has been signed up for mAb infusion tomorrow - discussed this. She is deciding to get vs postpone as overall starting to feel better today.  Reviewed supportive care, will place on COVID call list to check on pt every 2  days at least through 10th day of illness.  Red flags to notify us reviewed. Pt agrees with plan.           Meds ordered this encounter  Medications  . fluticasone (FLONASE) 50 MCG/ACT nasal spray    Sig: Place 2 sprays into both nostrils daily.    Dispense:  16 g    Refill:  1   No orders of the defined types were placed in this encounter.   I discussed the assessment and treatment plan with the patient. The patient was provided an opportunity to ask questions and all were answered. The patient agreed with the plan and demonstrated an understanding of the instructions. The patient was advised to call back or seek an in-person evaluation if the symptoms worsen or if the condition fails to improve as anticipated.  Follow up plan: Return if symptoms worsen or fail to improve.  Eustaquio Boyden, MD

## 2020-01-12 NOTE — Telephone Encounter (Signed)
Spoke with pt and she agrees to virtual visit at 12:30 today.

## 2020-01-12 NOTE — Assessment & Plan Note (Addendum)
Positive swab at home. She is vaccinated x2. Largely upper respiratory/sinus symptoms. Reviewed anticipated course of recovery. She has been signed up for mAb infusion tomorrow - discussed this. She is deciding to get vs postpone as overall starting to feel better today.  Reviewed supportive care, will place on COVID call list to check on pt every 2 days at least through 10th day of illness.  Red flags to notify us reviewed. Pt agrees with plan.

## 2020-01-13 ENCOUNTER — Ambulatory Visit (HOSPITAL_COMMUNITY)
Admission: RE | Admit: 2020-01-13 | Discharge: 2020-01-13 | Disposition: A | Discharge status: Home / Self Care | Payer: Federal, State, Local not specified - PPO | Source: Ambulatory Visit | Attending: Pulmonary Disease | Admitting: Pulmonary Disease

## 2020-01-13 DIAGNOSIS — U071 COVID-19: Secondary | ICD-10-CM | POA: Diagnosis not present

## 2020-01-13 DIAGNOSIS — I10 Essential (primary) hypertension: Secondary | ICD-10-CM | POA: Insufficient documentation

## 2020-01-13 DIAGNOSIS — Z6828 Body mass index (BMI) 28.0-28.9, adult: Secondary | ICD-10-CM | POA: Diagnosis not present

## 2020-01-13 MED ORDER — FAMOTIDINE IN NACL 20-0.9 MG/50ML-% IV SOLN: 20.0000 mg | Freq: Once | INTRAVENOUS | Status: DC | PRN

## 2020-01-13 MED ORDER — SODIUM CHLORIDE 0.9 % IV SOLN: INTRAVENOUS | Status: DC | PRN

## 2020-01-13 MED ORDER — EPINEPHRINE 0.3 MG/0.3ML IJ SOAJ: 0.3000 mg | Freq: Once | INTRAMUSCULAR | Status: DC | PRN

## 2020-01-13 MED ORDER — SODIUM CHLORIDE 0.9 % IV SOLN: Freq: Once | INTRAVENOUS | Status: AC

## 2020-01-13 MED ORDER — ALBUTEROL SULFATE HFA 108 (90 BASE) MCG/ACT IN AERS: 2.0000 | INHALATION_SPRAY | Freq: Once | RESPIRATORY_TRACT | Status: DC | PRN

## 2020-01-13 MED ORDER — METHYLPREDNISOLONE SODIUM SUCC 125 MG IJ SOLR: 125.0000 mg | Freq: Once | INTRAMUSCULAR | Status: DC | PRN

## 2020-01-13 MED ORDER — DIPHENHYDRAMINE HCL 50 MG/ML IJ SOLN: 50.0000 mg | Freq: Once | INTRAMUSCULAR | Status: DC | PRN

## 2020-01-13 NOTE — Progress Notes (Signed)
  Diagnosis: COVID-19  Physician:Dr. Wright  Procedure: Covid Infusion Clinic Med: bamlanivimab\etesevimab infusion - Provided patient with bamlanimivab\etesevimab fact sheet for patients, parents and caregivers prior to infusion.  Complications: No immediate complications noted.  Discharge: Discharged home   Krista Perez 01/13/2020  

## 2020-01-13 NOTE — Progress Notes (Signed)
Patient reviewed Fact Sheet for Patients, Parents, and Caregivers for Emergency Use Authorization (EUA) of bamlanivimab and etesevimab for the Treatment of Coronavirus. Patient also reviewed and is agreeable to the estimated cost of treatment. Patient is agreeable to proceed.   

## 2020-01-13 NOTE — Discharge Instructions (Signed)
10 Things You Can Do to Manage Your COVID-19 Symptoms at Home If you have possible or confirmed COVID-19: 1. Stay home from work and school. And stay away from other public places. If you must go out, avoid using any kind of public transportation, ridesharing, or taxis. 2. Monitor your symptoms carefully. If your symptoms get worse, call your healthcare provider immediately. 3. Get rest and stay hydrated. 4. If you have a medical appointment, call the healthcare provider ahead of time and tell them that you have or may have COVID-19. 5. For medical emergencies, call 911 and notify the dispatch personnel that you have or may have COVID-19. 6. Cover your cough and sneezes with a tissue or use the inside of your elbow. 7. Wash your hands often with soap and water for at least 20 seconds or clean your hands with an alcohol-based hand sanitizer that contains at least 60% alcohol. 8. As much as possible, stay in a specific room and away from other people in your home. Also, you should use a separate bathroom, if available. If you need to be around other people in or outside of the home, wear a mask. 9. Avoid sharing personal items with other people in your household, like dishes, towels, and bedding. 10. Clean all surfaces that are touched often, like counters, tabletops, and doorknobs. Use household cleaning sprays or wipes according to the label instructions. cdc.gov/coronavirus 07/24/2018 This information is not intended to replace advice given to you by your health care provider. Make sure you discuss any questions you have with your health care provider. Document Revised: 12/26/2018 Document Reviewed: 12/26/2018 Elsevier Patient Education  2020 Elsevier Inc. What types of side effects do monoclonal antibody drugs cause?  Common side effects  In general, the more common side effects caused by monoclonal antibody drugs include: . Allergic reactions, such as hives or itching . Flu-like signs and  symptoms, including chills, fatigue, fever, and muscle aches and pains . Nausea, vomiting . Diarrhea . Skin rashes . Low blood pressure   The CDC is recommending patients who receive monoclonal antibody treatments wait at least 90 days before being vaccinated.  Currently, there are no data on the safety and efficacy of mRNA COVID-19 vaccines in persons who received monoclonal antibodies or convalescent plasma as part of COVID-19 treatment. Based on the estimated half-life of such therapies as well as evidence suggesting that reinfection is uncommon in the 90 days after initial infection, vaccination should be deferred for at least 90 days, as a precautionary measure until additional information becomes available, to avoid interference of the antibody treatment with vaccine-induced immune responses. If you have any questions or concerns after the infusion please call the Advanced Practice Provider on call at [phone]. This number is ONLY intended for your use regarding questions or concerns about the infusion post-treatment side-effects.  Please do not provide this number to others for use. For return to work notes please contact your primary care provider.   If someone you know is interested in receiving treatment please have them call the COVID hotline at [phone].   

## 2020-01-15 ENCOUNTER — Telehealth: Payer: Self-pay

## 2020-01-15 NOTE — Telephone Encounter (Signed)
Lvm (on cell #) asking pt to call back.  Not able to lvm at home #.  Need update on sxs.

## 2020-01-16 ENCOUNTER — Encounter: Payer: Self-pay | Admitting: Family Medicine

## 2020-01-16 NOTE — Telephone Encounter (Signed)
Noted. plz call 1 more time Monday for final update.

## 2020-01-17 DIAGNOSIS — Z20822 Contact with and (suspected) exposure to covid-19: Secondary | ICD-10-CM | POA: Diagnosis not present

## 2020-01-19 DIAGNOSIS — Z1152 Encounter for screening for COVID-19: Secondary | ICD-10-CM | POA: Diagnosis not present

## 2020-01-19 DIAGNOSIS — Z03818 Encounter for observation for suspected exposure to other biological agents ruled out: Secondary | ICD-10-CM | POA: Diagnosis not present

## 2020-01-19 NOTE — Telephone Encounter (Signed)
See Pt Msg, 01/16/20.

## 2020-01-19 NOTE — Telephone Encounter (Signed)
Lvm (on cell #) asking pt to call back.  Not able to lvm at home #.  Need update on sxs.  

## 2020-01-19 NOTE — Telephone Encounter (Signed)
Noted  

## 2020-01-20 NOTE — Telephone Encounter (Signed)
Lvm (on cell #) asking pt to call back.  Not able to lvm at home #.  Need update on sxs.  

## 2020-01-21 ENCOUNTER — Other Ambulatory Visit: Payer: Self-pay | Admitting: Family Medicine

## 2020-01-21 NOTE — Telephone Encounter (Signed)
Lvm (on cell #) asking pt to call back.  Not able to lvm at home #.  Need update on sxs.   Also, sent MyChart message.

## 2020-01-22 NOTE — Telephone Encounter (Signed)
Name of Medication: Alprazolam Name of Pharmacy: [facility] Last Fill or Written Date and Quantity: 09/22/19, #30 Last Office Visit and Type: 03/12/19, neck pain, migraine Next Office Visit and Type: none Last Controlled Substance Agreement Date: 02/27/17 Last UDS: 02/27/17

## 2020-01-23 NOTE — Telephone Encounter (Signed)
ERx 

## 2020-02-12 ENCOUNTER — Encounter: Payer: Self-pay | Admitting: Family Medicine

## 2020-02-12 ENCOUNTER — Telehealth: Payer: Self-pay

## 2020-02-12 MED ORDER — FLUTICASONE PROPIONATE 50 MCG/ACT NA SUSP: 2.0000 | Freq: Every day | NASAL | 1 refills | Status: DC

## 2020-02-12 NOTE — Telephone Encounter (Signed)
Noted  

## 2020-02-12 NOTE — Telephone Encounter (Signed)
E-scribed refill.  Plz schedule labs and cpe.  

## 2020-02-12 NOTE — Telephone Encounter (Signed)
Called patient 3 times no answer or vm available on either phone to leave message. Will send a letter. EM

## 2020-02-23 ENCOUNTER — Encounter: Payer: Self-pay | Admitting: Family Medicine

## 2020-02-23 NOTE — Telephone Encounter (Signed)
See pt mychart message.  Do you know how we contact her insurance to try to get mAb infusion covered?  Thanks

## 2020-02-26 NOTE — Telephone Encounter (Signed)
I will see if I can get some help looking into this.  Thank you.

## 2020-03-19 ENCOUNTER — Encounter: Payer: Self-pay | Admitting: Family Medicine

## 2020-03-22 NOTE — Telephone Encounter (Signed)
Plz schedule lab and cpe visits.  

## 2020-03-23 ENCOUNTER — Other Ambulatory Visit (INDEPENDENT_AMBULATORY_CARE_PROVIDER_SITE_OTHER): Payer: Federal, State, Local not specified - PPO

## 2020-03-23 ENCOUNTER — Telehealth (INDEPENDENT_AMBULATORY_CARE_PROVIDER_SITE_OTHER): Payer: Federal, State, Local not specified - PPO | Admitting: Family Medicine

## 2020-03-23 ENCOUNTER — Ambulatory Visit (INDEPENDENT_AMBULATORY_CARE_PROVIDER_SITE_OTHER): Payer: Federal, State, Local not specified - PPO | Admitting: Obstetrics & Gynecology

## 2020-03-23 ENCOUNTER — Other Ambulatory Visit: Payer: Self-pay

## 2020-03-23 ENCOUNTER — Encounter: Payer: Self-pay | Admitting: Obstetrics & Gynecology

## 2020-03-23 VITALS — BP 115/75 | HR 94 | Ht 61.0 in | Wt 143.0 lb

## 2020-03-23 DIAGNOSIS — R140 Abdominal distension (gaseous): Secondary | ICD-10-CM

## 2020-03-23 DIAGNOSIS — R002 Palpitations: Secondary | ICD-10-CM

## 2020-03-23 DIAGNOSIS — Z1231 Encounter for screening mammogram for malignant neoplasm of breast: Secondary | ICD-10-CM | POA: Diagnosis not present

## 2020-03-23 DIAGNOSIS — Z01419 Encounter for gynecological examination (general) (routine) without abnormal findings: Secondary | ICD-10-CM | POA: Diagnosis not present

## 2020-03-23 DIAGNOSIS — E782 Mixed hyperlipidemia: Secondary | ICD-10-CM

## 2020-03-23 DIAGNOSIS — Z131 Encounter for screening for diabetes mellitus: Secondary | ICD-10-CM | POA: Diagnosis not present

## 2020-03-23 DIAGNOSIS — N951 Menopausal and female climacteric states: Secondary | ICD-10-CM

## 2020-03-23 LAB — CBC WITH DIFFERENTIAL/PLATELET
Basophils Absolute: 0 10*3/uL (ref 0.0–0.1)
Basophils Relative: 0.6 % (ref 0.0–3.0)
Eosinophils Absolute: 0.1 10*3/uL (ref 0.0–0.7)
Eosinophils Relative: 2.9 % (ref 0.0–5.0)
HCT: 41.8 % (ref 36.0–46.0)
Hemoglobin: 13.8 g/dL (ref 12.0–15.0)
Lymphocytes Relative: 28.2 % (ref 12.0–46.0)
Lymphs Abs: 1.4 10*3/uL (ref 0.7–4.0)
MCHC: 33.1 g/dL (ref 30.0–36.0)
MCV: 81.1 fl (ref 78.0–100.0)
Monocytes Absolute: 0.3 10*3/uL (ref 0.1–1.0)
Monocytes Relative: 5.6 % (ref 3.0–12.0)
Neutro Abs: 3.2 10*3/uL (ref 1.4–7.7)
Neutrophils Relative %: 62.7 % (ref 43.0–77.0)
Platelets: 273 10*3/uL (ref 150.0–400.0)
RBC: 5.16 Mil/uL — ABNORMAL HIGH (ref 3.87–5.11)
RDW: 14 % (ref 11.5–15.5)
WBC: 5.1 10*3/uL (ref 4.0–10.5)

## 2020-03-23 LAB — COMPREHENSIVE METABOLIC PANEL
ALT: 26 U/L (ref 0–35)
AST: 19 U/L (ref 0–37)
Albumin: 4.6 g/dL (ref 3.5–5.2)
Alkaline Phosphatase: 78 U/L (ref 39–117)
BUN: 12 mg/dL (ref 6–23)
CO2: 29 mEq/L (ref 19–32)
Calcium: 9.9 mg/dL (ref 8.4–10.5)
Chloride: 100 mEq/L (ref 96–112)
Creatinine, Ser: 0.56 mg/dL (ref 0.40–1.20)
GFR: 106.04 mL/min (ref >60.00)
Glucose, Bld: 94 mg/dL (ref 70–99)
Potassium: 4.5 mEq/L (ref 3.5–5.1)
Sodium: 136 mEq/L (ref 135–145)
Total Bilirubin: 0.6 mg/dL (ref 0.2–1.2)
Total Protein: 7.7 g/dL (ref 6.0–8.3)

## 2020-03-23 LAB — LIPID PANEL
Cholesterol: 255 mg/dL — ABNORMAL HIGH (ref 0–200)
HDL: 42.2 mg/dL (ref >39.00)
NonHDL: 213.26
Total CHOL/HDL Ratio: 6
Triglycerides: 222 mg/dL — ABNORMAL HIGH (ref 0.0–149.0)
VLDL: 44.4 mg/dL — ABNORMAL HIGH (ref 0.0–40.0)

## 2020-03-23 LAB — HEMOGLOBIN A1C: Hgb A1c MFr Bld: 5.8 % (ref 4.6–6.5)

## 2020-03-23 LAB — LDL CHOLESTEROL, DIRECT: Direct LDL: 179 mg/dL

## 2020-03-23 LAB — TSH: TSH: 1.77 u[IU]/mL (ref 0.35–4.50)

## 2020-03-23 MED ORDER — ESTRADIOL 0.1 MG/GM VA CREA: 1.0000 | TOPICAL_CREAM | Freq: Every day | VAGINAL | 12 refills | Status: DC

## 2020-03-23 NOTE — Telephone Encounter (Signed)
Pt called in wanted to do her CPE labs at 1130 today , wanted to know about placing orders

## 2020-03-23 NOTE — Telephone Encounter (Signed)
Labs ordered.

## 2020-03-23 NOTE — Patient Instructions (Addendum)
Can use Hydrocortisone cream externally for irritation, up to 2-3 times a day.   Preventive Care 40-51 Years Old, Female Preventive care refers to lifestyle choices and visits with your health care provider that can promote health and wellness. This includes:  A yearly physical exam. This is also called an annual wellness visit.  Regular dental and eye exams.  Immunizations.  Screening for certain conditions.  Healthy lifestyle choices, such as: ? Eating a healthy diet. ? Getting regular exercise. ? Not using drugs or products that contain nicotine and tobacco. ? Limiting alcohol use. What can I expect for my preventive care visit? Physical exam Your health care provider will check your:  Height and weight. These may be used to calculate your BMI (body mass index). BMI is a measurement that tells if you are at a healthy weight.  Heart rate and blood pressure.  Body temperature.  Skin for abnormal spots. Counseling Your health care provider may ask you questions about your:  Past medical problems.  Family's medical history.  Alcohol, tobacco, and drug use.  Emotional well-being.  Home life and relationship well-being.  Sexual activity.  Diet, exercise, and sleep habits.  Work and work Astronomer.  Access to firearms.  Method of birth control.  Menstrual cycle.  Pregnancy history. What immunizations do I need? Vaccines are usually given at various ages, according to a schedule. Your health care provider will recommend vaccines for you based on your age, medical history, and lifestyle or other factors, such as travel or where you work.   What tests do I need? Blood tests  Lipid and cholesterol levels. These may be checked every 5 years, or more often if you are over 38 years old.  Hepatitis C test.  Hepatitis B test. Screening  Lung cancer screening. You may have this screening every year starting at age 83 if you have a 30-pack-year history of  smoking and currently smoke or have quit within the past 15 years.  Colorectal cancer screening. ? All adults should have this screening starting at age 79 and continuing until age 50. ? Your health care provider may recommend screening at age 46 if you are at increased risk. ? You will have tests every 1-10 years, depending on your results and the type of screening test.  Diabetes screening. ? This is done by checking your blood sugar (glucose) after you have not eaten for a while (fasting). ? You may have this done every 1-3 years.  Mammogram. ? This may be done every 1-2 years. ? Talk with your health care provider about when you should start having regular mammograms. This may depend on whether you have a family history of breast cancer.  BRCA-related cancer screening. This may be done if you have a family history of breast, ovarian, tubal, or peritoneal cancers.  Pelvic exam and Pap test. ? This may be done every 3 years starting at age 19. ? Starting at age 58, this may be done every 5 years if you have a Pap test in combination with an HPV test. Other tests  STD (sexually transmitted disease) testing, if you are at risk.  Bone density scan. This is done to screen for osteoporosis. You may have this scan if you are at high risk for osteoporosis. Talk with your health care provider about your test results, treatment options, and if necessary, the need for more tests. Follow these instructions at home: Eating and drinking  Eat a diet that includes fresh fruits and  vegetables, whole grains, lean protein, and low-fat dairy products.  Take vitamin and mineral supplements as recommended by your health care provider.  Do not drink alcohol if: ? Your health care provider tells you not to drink. ? You are pregnant, may be pregnant, or are planning to become pregnant.  If you drink alcohol: ? Limit how much you have to 0-1 drink a day. ? Be aware of how much alcohol is in your  drink. In the [place] one drink equals one 12 oz bottle of beer (355 mL), one 5 oz glass of wine (148 mL), or one 1 oz glass of hard liquor (44 mL).   Lifestyle  Take daily care of your teeth and gums. Brush your teeth every morning and night with fluoride toothpaste. Floss one time each day.  Stay active. Exercise for at least 30 minutes 5 or more days each week.  Do not use any products that contain nicotine or tobacco, such as cigarettes, e-cigarettes, and chewing tobacco. If you need help quitting, ask your health care provider.  Do not use drugs.  If you are sexually active, practice safe sex. Use a condom or other form of protection to prevent STIs (sexually transmitted infections).  If you do not wish to become pregnant, use a form of birth control. If you plan to become pregnant, see your health care provider for a prepregnancy visit.  If told by your health care provider, take low-dose aspirin daily starting at age 50.  Find healthy ways to cope with stress, such as: ? Meditation, yoga, or listening to music. ? Journaling. ? Talking to a trusted person. ? Spending time with friends and family. Safety  Always wear your seat belt while driving or riding in a vehicle.  Do not drive: ? If you have been drinking alcohol. Do not ride with someone who has been drinking. ? When you are tired or distracted. ? While texting.  Wear a helmet and other protective equipment during sports activities.  If you have firearms in your house, make sure you follow all gun safety procedures. What's next?  Visit your health care provider once a year for an annual wellness visit.  Ask your health care provider how often you should have your eyes and teeth checked.  Stay up to date on all vaccines. This information is not intended to replace advice given to you by your health care provider. Make sure you discuss any questions you have with your health care provider. Document Revised:  10/14/2019 Document Reviewed: 09/20/2017 Elsevier Patient Education  2021 Elsevier Inc.  

## 2020-03-23 NOTE — Progress Notes (Signed)
GYNECOLOGY ANNUAL PREVENTATIVE CARE ENCOUNTER NOTE  History:     Krista Perez is a 50 y.o. [phone] female here for a routine annual gynecologic exam.  Current complaints: vaginal dryness, makes intercourse uncomfortable. Used lubrication, did not like this.   Denies abnormal vaginal bleeding, discharge, pelvic pain, problems with intercourse or other gynecologic concerns.    Gynecologic History Patient's last menstrual period was 10/05/2012. Contraception: post menopausal status Last Pap: 02/27/2019. Results were: normal with negative HPV Last mammogram: 04/16/2019. Results were: normal  Obstetric History OB History  Gravida Para Term Preterm AB Living  3 1 0 1 2 1   SAB IAB Ectopic Multiple Live Births  2       1    # Outcome Date GA Lbr Len/2nd Weight Sex Delivery Anes PTL Lv  3 Preterm 07/06/07 [redacted]w[redacted]d   F CS-LTranv   LIV     Complications: Severe preeclampsia  2 SAB           1 SAB             Past Medical History:  Diagnosis Date  . Chest pain    a. 06/2014 - eval in [place] for sharp c/p;  b. 01/2015 ED vist, neg trop.  . Generalized headaches    frequent  . History of hepatitis    a. Hep A after contaminated food, cleared  . Hx of migraines   . Hyperlipidemia    a. 01/2014 TC 215, TG 139, HDL 44, LDL 143.  02/2014 Palpitations   . Vaginal Pap smear, abnormal     Past Surgical History:  Procedure Laterality Date  . APPENDECTOMY  1990  . AUGMENTATION MAMMAPLASTY Bilateral 2006  . BREAST ENHANCEMENT SURGERY    . TONSILLECTOMY  1980    Current Outpatient Medications on File Prior to Visit  Medication Sig Dispense Refill  . ALPRAZolam (XANAX) 0.5 MG tablet TAKE 0.5-1 TABLETS (0.25-0.5 MG TOTAL) BY MOUTH AT BEDTIME AS NEEDED. FOR SLEEP 30 tablet 0  . Multiple Vitamins-Minerals (MULTIVITAMIN ADULT) TABS Take 1 tablet by mouth daily.    . Probiotic Product (PROBIOTIC-10 PO) Take by mouth.    . cyclobenzaprine (FLEXERIL) 5 MG tablet Take 1 tablet (5 mg total) by mouth 3  (three) times daily as needed for muscle spasms (sedation precautions). (Patient not taking: Reported on 03/23/2020) 30 tablet 0  . fluticasone (FLONASE) 50 MCG/ACT nasal spray Place 2 sprays into both nostrils daily. (Patient not taking: Reported on 03/23/2020) 16 g 1   No current facility-administered medications on file prior to visit.    No Known Allergies  Social History:  reports that she has never smoked. She has never used smokeless tobacco. She reports current alcohol use. She reports that she does not use drugs.  Family History  Problem Relation Age of Onset  . Cancer Maternal Grandfather 50       lung (smoker)  . Cancer Maternal Grandmother 70       leukemia  . Pancreatic cancer Mother 84       Terminal cancer dx in her 90's - still alive, lives in 65's.  . Other Father        alive and well, 34, lives in [place], [place]  . Other Brother        alive and well, lives in [place], [place].  . Coronary artery disease Neg Hx   . Stroke Neg Hx   . Diabetes Neg Hx   . Hypertension Neg Hx  The following portions of the patient's history were reviewed and updated as appropriate: allergies, current medications, past family history, past medical history, past social history, past surgical history and problem list.  Review of Systems Pertinent items noted in HPI and remainder of comprehensive ROS otherwise negative.  Physical Exam:  BP 115/75   Pulse 94   Ht 5\' 1"  (1.549 m)   Wt 143 lb (64.9 kg)   LMP 10/05/2012   BMI 27.02 kg/m  CONSTITUTIONAL: Well-developed, well-nourished female in no acute distress.  HENT:  Normocephalic, atraumatic, External right and left ear normal.  EYES: Conjunctivae and EOM are normal. Pupils are equal, round, and reactive to light. No scleral icterus.  NECK: Normal range of motion, supple, no masses.  Normal thyroid.  SKIN: Skin is warm and dry. No rash noted. Not diaphoretic. No erythema. No pallor. MUSCULOSKELETAL: Normal range of motion. No tenderness.   No cyanosis, clubbing, or edema. NEUROLOGIC: Alert and oriented to person, place, and time. Normal reflexes, muscle tone coordination.  PSYCHIATRIC: Normal mood and affect. Normal behavior. Normal judgment and thought content. CARDIOVASCULAR: Normal heart rate noted, regular rhythm RESPIRATORY: Clear to auscultation bilaterally. Effort and breath sounds normal, no problems with respiration noted. BREASTS: Symmetric in size. No masses, tenderness, skin changes, nipple drainage, or lymphadenopathy bilaterally. Performed in the presence of a chaperone. ABDOMEN: Soft, no distention noted.  No tenderness, rebound or guarding.  PELVIC: Normal appearing external genitalia and urethral meatus; normal appearing distal vaginal mucosa with mild atrophy.  No abnormal discharge noted.  Performed in the presence of a chaperone.   Assessment and Plan:      1. Menopausal vaginal dryness Will do trial of estrogen vaginal cream. - estradiol (ESTRACE) 0.1 MG/GM vaginal cream; Place 1 Applicatorful vaginally at bedtime. Use every night for 2 weeks, then apply three times a week  Dispense: 42.5 g; Refill: 12  2. Breast cancer screening by mammogram Mammogram scheduled - MM 3D SCREEN BREAST W/IMPLANT BILATERAL; Future  3. Well woman exam with routine gynecological exam Up to date on pap smear screening. Routine preventative health maintenance measures emphasized. Please refer to After Visit Summary for other counseling recommendations.      10/07/2012, MD, FACOG Obstetrician & Gynecologist, [facility] for [facility]

## 2020-03-24 DIAGNOSIS — R140 Abdominal distension (gaseous): Secondary | ICD-10-CM | POA: Diagnosis not present

## 2020-03-24 NOTE — Addendum Note (Signed)
Addended by: Aquilla Solian on: 03/24/2020 10:32 AM   Modules accepted: Orders

## 2020-03-25 LAB — HELICOBACTER PYLORI  SPECIAL ANTIGEN
MICRO NUMBER:: 11598186
SPECIMEN QUALITY: ADEQUATE

## 2020-04-20 ENCOUNTER — Other Ambulatory Visit: Payer: Self-pay

## 2020-04-20 ENCOUNTER — Encounter: Payer: Self-pay | Admitting: Family Medicine

## 2020-04-20 ENCOUNTER — Ambulatory Visit (INDEPENDENT_AMBULATORY_CARE_PROVIDER_SITE_OTHER): Payer: Federal, State, Local not specified - PPO | Admitting: Family Medicine

## 2020-04-20 VITALS — BP 120/82 | HR 85 | Temp 97.8°F | Ht 61.5 in | Wt 143.5 lb

## 2020-04-20 DIAGNOSIS — E538 Deficiency of other specified B group vitamins: Secondary | ICD-10-CM

## 2020-04-20 DIAGNOSIS — E782 Mixed hyperlipidemia: Secondary | ICD-10-CM

## 2020-04-20 DIAGNOSIS — M542 Cervicalgia: Secondary | ICD-10-CM | POA: Diagnosis not present

## 2020-04-20 DIAGNOSIS — I10 Essential (primary) hypertension: Secondary | ICD-10-CM

## 2020-04-20 DIAGNOSIS — Z1211 Encounter for screening for malignant neoplasm of colon: Secondary | ICD-10-CM

## 2020-04-20 DIAGNOSIS — Z0000 Encounter for general adult medical examination without abnormal findings: Secondary | ICD-10-CM

## 2020-04-20 DIAGNOSIS — H60543 Acute eczematoid otitis externa, bilateral: Secondary | ICD-10-CM

## 2020-04-20 MED ORDER — DESONIDE 0.05 % EX CREA: TOPICAL_CREAM | Freq: Two times a day (BID) | CUTANEOUS | 0 refills | Status: AC

## 2020-04-20 NOTE — Assessment & Plan Note (Signed)
Preventative protocols reviewed and updated unless pt declined. Discussed healthy diet and lifestyle.  

## 2020-04-20 NOTE — Patient Instructions (Addendum)
Pass by lab to pick up stool kit.  Regresar para vacuna de Tdap (nurse visit). considere vacuna para culebrilla (Shingrix).  Moisturizing cream to ears - si no mejora puede tratar desonide crema de corticosteroide que he mandado a la farmacia, me deja saber si esta empeorando. Feliz cumpleaos!   [place] Maintenance for Postmenopausal Women La menopausia es un proceso normal en el cual la capacidad de quedar embarazada llega a su fin. Este proceso ocurre lentamente a lo largo de un perodo de muchos meses o aos; por lo general, entre los 26 y los 68aos. La menopausia es completa cuando no se ha tenido el perodo menstrual por 36mses. Es importante hablar con el mdico sobre algunas de las enfermedades ms comunes que afectan a las mujeres despus de la menopausia (mujeres posmenopusicas). Estas incluyen la enfermedad cardaca, el cncer y la prdida sea (osteoporosis). Adoptar un estilo de vida saludable y recibir atencin preventiva pueden ayudar a promover la salud y eMusician Las medidas que tome tambin pueden reducir las probabilidades de dActoralgunas de estas enfermedades frecuentes. Qu debo saber acerca de la menopausia? Durante la menopausia, puede tener una serie de sntomas, por ejemplo:  Acaloramiento. Estos pueden ser moderados o intensos.  Sudoracin nocturna.  Disminucin del deseo sexual.  Cambios en el estado de nimo.  Dolores de c[place]  Cansancio.  Irritabilidad.  Problemas de memoria.  Insomnio. Tratar o no estos sntomas es una decisin que se toma con el mdico. Necesito terapia de reemplazo hormonal?  La terapia de reemplazo hormonal es eficaz para tratar los sntomas causados por la menopausia, como los acaloramientos y las sudoraciones nocturnas.  La reposicin hormonal conlleva ciertos riesgos, especialmente a medida que una mujer envejece. Si est pensando en usar estrgeno o  estrgeno con progestina, analice los beneficios y los riesgos con el mdico. Cul es mi riesgo de sufrir enfermedad cardaca y accidente cerebrovascular? A medida que se envejece, aumenta el riesgo de enfermedad cardaca, infarto de miocardio y accidente cerebrovascular. Una de las causas puede ser un cambio en las hormonas del cuerpo durante la menopausia. Esto puede afectar la forma en que el organismo procesa las gPlantation [place] los triglicridos y el colesterol de su dieta. El infarto de miocardio y el accidente cerebrovascular son emergencias mdicas. Hay muchas cosas que se pueden hacer para ayudar a prevenir la enfermedad cardaca y el accidente cerebrovascular. Contrlese la presin arterial  La hipertensin arterial causa enfermedades cardacas y aSerbiael riesgo de accidente cerebrovascular. Es ms probable que esto se manifieste en las personas que tienen lecturas de presin arterial alta, tienen ascendencia africana o tienen sobrepeso.  Hgase controlar la presin arterial: ? Cada 3 a 5 aos si tiene entre 18 y 339aos. ? Todos los aos si es mayor de 4[place] Consuma una dieta saludable  Consuma una dieta que incluya muchas verduras, frutas, productos lcteos con bajo contenido de gDjiboutiy pAdvertising account planner  No consuma muchos alimentos ricos en grasas slidas, azcares agregados o sodio.   Haga ejercicio con regularidad Haga ejercicio con regularidad. Esta es una de las prcticas ms importantes que puede hacer por su salud. La mayora de los adultos deben seguir estas pautas:  Intente realizar al menos 1521mutos de actividad fsica por semana. El ejercicio debe aumentar la frecuencia cardaca y haNature conservation officerranspirar (ejercicio de intensidad moderada).  Intente hacer ejercicios de elongacin por lo menos dos veces por semana. Agrguelos al plan de ejercicio de intensidad moderada.  Pasar menos tiempo sentados. Incluso la actividad fsica ligera puede ser beneficiosa. Otros  consejos  Trabaje con su mdico para Science writer o Theatre manager un peso saludable.  No consuma ningn producto que contenga nicotina o tabaco, como cigarrillos, cigarrillos electrnicos y tabaco de Higher education careers adviser. Si necesita ayuda para dejar de fumar, consulte al mdico.  Conozca sus cifras. Pdale al mdico que le controle el colesterol y el nivel sanguneo de azcar en la sangre (glucosa). Siga hacindose anlisis de [employer] se lo haya indicado el mdico. Necesito realizarme pruebas de deteccin del cncer? Segn su historia clnica y sus antecedentes familiares, es posible que deba realizarse pruebas de deteccin del cncer en diferentes etapas de la vida. Esto puede incluir pruebas de deteccin de lo siguiente:  Cncer de mama.  Cncer de cuello uterino.  Cncer de pulmn.  Cncer colorrectal. Cul es mi riesgo de tener osteoporosis? Despus de la menopausia, puede correr un riesgo ms alto de tener osteoporosis. La osteoporosis es una afeccin en la cual la destruccin de la masa sea ocurre con mayor rapidez que su formacin. Para ayudar a prevenir esta afeccin o las fracturas seas que pueden ocurrir a causa de [place], usted puede tomar las siguientes medidas:  Si tiene entre 19 y 50aos, tome como mnimo 1081m de calcio y 6019mde vitaminaD por daTraining and development officer Si es mayor de 50aos pero menor de 70aos, tome como mnimo 120065me calcio y 600m80m vitaminaD por da. Training and development officeri es mayor de 70aos, tome como mnimo 1200mg23mcalcio y 800mg 45mitaminaD por da. FuTraining and development officerr y beber alcohol en exceso aumentan el riesgo de osteoporosis. Consuma alimentos ricos en calcio y vitaminaD, y haga ejercicios con soporte de peso varias veces a la semana, como se lo haya indicado el mdico. De qu manera la menopausia afecta mi salud mental? La depresin puede presentarse a cualquier edad, pero es ms frecuente a medida que una persona envejece. Los sntomas comunes de depresin incluyen lo  siguiente:  Desnimo o tristeza.  Cambios en los patrones de sueo.  Cambios en el apetito o en los hbitos de alimentacin.  Sensacin de falta general de motivacin o placer al realizYahooidades que sola disfrutar.  Crisis frecuentes de llanto. Hable con el mdico si cree que tiene depresin. Instrucciones generales Visite a su mdico para hacerse exmenes de bienestar peridicos y aplicarse vacunas. Puede incluir:  Programar exmenes peridicos dentales, de la salud y de la visPublic librarianibir y mantenComputer Sciences Corporations incluyen los siguientes: ? VacunaHuman resources officeruese esta vacuna todos los aos antes de que comience la temporada de gripe. ? Vacuna contra la neumona. ? Vacuna contra el herpes. ? Vacuna contra el ttanos, la difteria y la tos ferinaDyann Ruddle). El mdico tambin puede recomendarle que se aplique otras vacunas. Notifique a su mdico si alguna vez ha sido vctima de abuso o si no se siente seguro en su hogar. Resumen  La menopausia es un proceso normal en el cual la capacidad de quedar embarazada llega a su fin.  Esta condicin causa acaloramientos, sudoraciones nocturnas, disminucin del inters en el sexo, cambios en el estado de nimo, dolores de cabezaNetherlandsta de sueo.  El tratamiento de esta afeccin puede incluir una terapia de reemplazo hormonal.  Tome medidas para mantenerse sana, 26e ellas, hacer ejercicio con regularidad, seguir una dieta saludable, controlar su peso y medirse la presin arterial y los niveles de azcar Dispensing opticianse pruebas para detectFilm/video editor  y depresin. Asegrese de estar al da con todas las vacunas. Esta informacin no tiene Marine scientist el consejo del mdico. Asegrese de hacerle al mdico cualquier pregunta que tenga. Document Revised: 01/30/2018 Document Reviewed: 01/30/2018 Elsevier Patient Education  [place].

## 2020-04-20 NOTE — Assessment & Plan Note (Addendum)
Reviewed trending elevated LDL. Chronic, off meds. Reviewed diet choices to improve cholesterol levels.  The 10-year ASCVD risk score Denman George DC Montez Hageman., et al., 2013) is: 2.2%   Values used to calculate the score:     Age: 50 years     Sex: Female     Is Non-Hispanic African American: No     Diabetic: No     Tobacco smoker: No     Systolic Blood Pressure: 120 mmHg     Is BP treated: No     HDL Cholesterol: 42.2 mg/dL     Total Cholesterol: 255 mg/dL

## 2020-04-20 NOTE — Progress Notes (Signed)
Patient ID: Krista Perez, female    DOB: 03/05/1970, 50 y.o.   MRN: [id number]  This visit was conducted in person.  BP 120/82   Pulse 85   Temp 97.8 F (36.6 C) (Temporal)   Ht 5' 1.5" (1.562 m)   Wt 143 lb 8 oz (65.1 kg)   LMP 10/05/2012   SpO2 98%   BMI 26.68 kg/m    CC: CPE Subjective:   HPI: Krista Perez is a 50 y.o. female presenting on 04/20/2020 for Annual Exam   Mother passed away earlier this year.  Notes increasing insomnia. Moving bedtime forward by a few hours.   Saw neurologist - occipital neuralgia - decided not to try topamax. Baclofen helps PRN (takes 1/2 tablet). Found to have low b12 - has been sporadically replacing orally   Preventative: Colon cancer screening - discussed. Would like iFOB. Well woman -with GYNDr Anyanwu - ASCUSwithnegativeHRHPV on 12/08/2013. Pap 11/2014 WNL. ASCUSwithnegativeHRHPVon 01/2017 and 01/2018. Trial estrace cream [facility] L[facility] ~2016 - menopausal.  Lung cancer screening - not eligible  Flu shot -declines due to worsening migraines COVD vaccine Pfizer 02/2019, 03/2019 Td 2011. Tdap - will return for nurse visit for this  Completed hep B. Shingrix - discussed, will return for this  Seat belt use discussed.  Sunscreen use discussed.No changingmoles. Non smoker Alcohol - rare  Dentist - q6 mo  Eye exam Q2 yrs   Caffeine: rare decaf  Lives with husband and daughter (2009) and 1 dog  Occupation: housewife, Forensic scientist. Prior lived in [place], husband is [employer] agent  Activity:no regular exercise Diet: cooks at [employer], fruits/vegetables daily     Relevant past medical, surgical, family and social history reviewed and updated as indicated. Interim medical history since our last visit reviewed. Allergies and medications reviewed and updated. Outpatient Medications Prior to Visit  Medication Sig Dispense Refill  . ALPRAZolam (XANAX) 0.5 MG tablet TAKE  0.5-1 TABLETS (0.25-0.5 MG TOTAL) BY MOUTH AT BEDTIME AS NEEDED. FOR SLEEP 30 tablet 0  . levocetirizine (XYZAL) 5 MG tablet Take 5 mg by mouth every evening. As needed    . Multiple Vitamins-Minerals (MULTIVITAMIN ADULT) TABS Take 1 tablet by mouth daily.    . Probiotic Product (PROBIOTIC-10 PO) Take by mouth.    . cyclobenzaprine (FLEXERIL) 5 MG tablet Take 1 tablet (5 mg total) by mouth 3 (three) times daily as needed for muscle spasms (sedation precautions). (Patient not taking: Reported on 03/23/2020) 30 tablet 0  . estradiol (ESTRACE) 0.1 MG/GM vaginal cream Place 1 Applicatorful vaginally at bedtime. Use every night for 2 weeks, then apply three times a week 42.5 g 12  . fluticasone (FLONASE) 50 MCG/ACT nasal spray Place 2 sprays into both nostrils daily. (Patient not taking: Reported on 03/23/2020) 16 g 1   No facility-administered medications prior to visit.     Per HPI unless specifically indicated in ROS section below Review of Systems  Constitutional: Negative for activity change, appetite change, chills, fatigue, fever and unexpected weight change.  HENT: Negative for hearing loss.   Eyes: Negative for visual disturbance.  Respiratory: Negative for cough, chest tightness, shortness of breath and wheezing.   Cardiovascular: Negative for chest pain, palpitations and leg swelling.  Gastrointestinal: Negative for abdominal distention, abdominal pain, blood in stool, constipation, diarrhea, nausea and vomiting.  Genitourinary: Negative for difficulty urinating and hematuria.  Musculoskeletal: Negative for arthralgias, myalgias and neck pain.  Skin: Negative for rash.  Neurological:  Negative for dizziness, seizures, syncope and headaches.  Hematological: Negative for adenopathy. Does not bruise/bleed easily.  Psychiatric/Behavioral: Negative for dysphoric mood. The patient is not nervous/anxious.    Objective:  BP 120/82   Pulse 85   Temp 97.8 F (36.6 C) (Temporal)   Ht 5' 1.5"  (1.562 m)   Wt 143 lb 8 oz (65.1 kg)   LMP 10/05/2012   SpO2 98%   BMI 26.68 kg/m   Wt Readings from Last 3 Encounters:  04/20/20 143 lb 8 oz (65.1 kg)  03/23/20 143 lb (64.9 kg)  03/12/19 146 lb 7 oz (66.4 kg)      Physical Exam Vitals and nursing note reviewed.  Constitutional:      General: She is not in acute distress.    Appearance: Normal appearance. She is well-developed. She is not ill-appearing.  HENT:     Head: Normocephalic and atraumatic.     Right Ear: Hearing, tympanic membrane and external ear normal.     Left Ear: Hearing, tympanic membrane and external ear normal.     Ears:     Comments: Dry skin to bilateral ear canals, L>R Eyes:     General: No scleral icterus.    Extraocular Movements: Extraocular movements intact.     Conjunctiva/sclera: Conjunctivae normal.     Pupils: Pupils are equal, round, and reactive to light.  Neck:     Thyroid: No thyroid mass or thyromegaly.  Cardiovascular:     Rate and Rhythm: Normal rate and regular rhythm.     Pulses: Normal pulses.          Radial pulses are 2+ on the right side and 2+ on the left side.     Heart sounds: Normal heart sounds. No murmur heard.   Pulmonary:     Effort: Pulmonary effort is normal. No respiratory distress.     Breath sounds: Normal breath sounds. No wheezing, rhonchi or rales.  Abdominal:     General: Abdomen is flat. Bowel sounds are normal. There is no distension.     Palpations: Abdomen is soft. There is no mass.     Tenderness: There is no abdominal tenderness. There is no guarding or rebound.     Hernia: No hernia is present.  Musculoskeletal:        General: Normal range of motion.     Cervical back: Normal range of motion and neck supple.     Right lower leg: No edema.     Left lower leg: No edema.  Lymphadenopathy:     Cervical: No cervical adenopathy.  Skin:    General: Skin is warm and dry.     Findings: No rash.  Neurological:     General: No focal deficit present.      Mental Status: She is alert and oriented to person, place, and time.     Comments: CN grossly intact, station and gait intact  Psychiatric:        Mood and Affect: Mood normal.        Behavior: Behavior normal.        Thought Content: Thought content normal.        Judgment: Judgment normal.       Results for orders placed or performed in visit on 97/35/32  Helicobacter pylori special antigen   Specimen: Stool  Result Value Ref Range   MICRO NUMBER: [id number]    SPECIMEN QUALITY Adequate    SOURCE: STOOL    STATUS: FINAL  RESULT:      Not Detected  Antimicrobials, proton pump inhibitors, and bismuth preparations inhibit H. pylori and ingestion up to two weeks prior to testing may cause false negative results. If clinically indicated the test should be repeated on a new specimen  obtained two weeks after discontinuing treatment.   Hemoglobin A1c  Result Value Ref Range   Hgb A1c MFr Bld 5.8 4.6 - 6.5 %  CBC with Differential/Platelet  Result Value Ref Range   WBC 5.1 4.0 - 10.5 K/uL   RBC 5.16 (H) 3.87 - 5.11 Mil/uL   Hemoglobin 13.8 12.0 - 15.0 g/dL   HCT 41.8 36.0 - 46.0 %   MCV 81.1 78.0 - 100.0 fl   MCHC 33.1 30.0 - 36.0 g/dL   RDW 14.0 11.5 - 15.5 %   Platelets 273.0 150.0 - 400.0 K/uL   Neutrophils Relative % 62.7 43.0 - 77.0 %   Lymphocytes Relative 28.2 12.0 - 46.0 %   Monocytes Relative 5.6 3.0 - 12.0 %   Eosinophils Relative 2.9 0.0 - 5.0 %   Basophils Relative 0.6 0.0 - 3.0 %   Neutro Abs 3.2 1.4 - 7.7 K/uL   Lymphs Abs 1.4 0.7 - 4.0 K/uL   Monocytes Absolute 0.3 0.1 - 1.0 K/uL   Eosinophils Absolute 0.1 0.0 - 0.7 K/uL   Basophils Absolute 0.0 0.0 - 0.1 K/uL  TSH  Result Value Ref Range   TSH 1.77 0.35 - 4.50 uIU/mL  LDL cholesterol, direct  Result Value Ref Range   Direct LDL 179.0 mg/dL   Lab Results  Component Value Date   CHOL 255 (H) 03/23/2020   HDL 42.20 03/23/2020   LDLCALC 151 (H) 02/19/2017   LDLDIRECT 179.0 03/23/2020   TRIG 222.0 (H)  03/23/2020   CHOLHDL 6 03/23/2020   No results found for: VITAMINB12  Lab Results  Component Value Date   CREATININE 0.56 03/23/2020   BUN 12 03/23/2020   NA 136 03/23/2020   K 4.5 03/23/2020   CL 100 03/23/2020   CO2 29 03/23/2020    Lab Results  Component Value Date   ALT 26 03/23/2020   AST 19 03/23/2020   ALKPHOS 78 03/23/2020   BILITOT 0.6 03/23/2020    Assessment & Plan:  This visit occurred during the SARS-CoV-2 public health emergency.  Safety protocols were in place, including screening questions prior to the visit, additional usage of staff PPE, and extensive cleaning of exam room while observing appropriate contact time as indicated for disinfecting solutions.   Problem List Items Addressed This Visit    HLD (hyperlipidemia)    Reviewed trending elevated LDL. Chronic, off meds. Reviewed diet choices to improve cholesterol levels.  The 10-year ASCVD risk score Mikey Bussing DC Brooke Bonito., et al., 2013) is: 2.2%   Values used to calculate the score:     Age: 89 years     Sex: Female     Is Non-Hispanic African American: No     Diabetic: No     Tobacco smoker: No     Systolic Blood Pressure: 833 mmHg     Is BP treated: No     HDL Cholesterol: 42.2 mg/dL     Total Cholesterol: 255 mg/dL       Health maintenance examination - Primary    Preventative protocols reviewed and updated unless pt declined. Discussed healthy diet and lifestyle.       Neck pain on right side    Saw [facility] neurology, thought occipital neuralgia started  on baclofen and topamax - decided not to try topamax. Stable period      Elevated blood pressure reading with diagnosis of hypertension    BP stable today.       Vitamin B12 deficiency    Recent labs through neurology noted low B12 level - has since started daily replacement. Will need this rechecked next labs.       Dermatitis of ear canal, bilateral    Anticipate ear dermatitis - abx cream and moisturize hasn't helped. Will trial Rx desonide  cream. Update if not improving or any worsening.        Other Visit Diagnoses    Special screening for malignant neoplasms, colon       Relevant Orders   Fecal occult blood, imunochemical       Meds ordered this encounter  Medications  . desonide (DESOWEN) 0.05 % cream    Sig: Apply topically 2 (two) times daily. Apply to AA, no more than 10 days in a row    Dispense:  15 g    Refill:  0  . vitamin B-12 (CYANOCOBALAMIN) 1000 MCG tablet    Sig: Take 1 tablet (1,000 mcg total) by mouth daily.   Orders Placed This Encounter  Procedures  . Fecal occult blood, imunochemical    Standing Status:   Future    Standing Expiration Date:   04/21/2021    Patient instructions: Pass by lab to pick up stool kit.  Regresar para vacuna de Tdap (nurse visit). considere vacuna para culebrilla (Shingrix).  Moisturizing cream to ears - si no mejora puede tratar desonide crema de corticosteroide que he mandado a la farmacia, me deja saber si esta empeorando. Feliz cumpleaos!   Follow up plan: Return in about 1 year (around 04/20/2021), or if symptoms worsen or fail to improve, for annual exam, prior fasting for blood work.  Ria Bush, MD

## 2020-04-21 ENCOUNTER — Encounter: Payer: Self-pay | Admitting: Family Medicine

## 2020-04-21 DIAGNOSIS — H60543 Acute eczematoid otitis externa, bilateral: Secondary | ICD-10-CM | POA: Insufficient documentation

## 2020-04-21 MED ORDER — VITAMIN B-12 1000 MCG PO TABS: 1000.0000 ug | ORAL_TABLET | Freq: Every day | ORAL | Status: DC

## 2020-04-21 NOTE — Assessment & Plan Note (Addendum)
Saw [facility] neurology, thought occipital neuralgia started on baclofen and topamax - decided not to try topamax. Stable period

## 2020-04-21 NOTE — Assessment & Plan Note (Signed)
BP stable today.

## 2020-04-21 NOTE — Assessment & Plan Note (Signed)
Anticipate ear dermatitis - abx cream and moisturize hasn't helped. Will trial Rx desonide cream. Update if not improving or any worsening.

## 2020-04-21 NOTE — Assessment & Plan Note (Signed)
Recent labs through neurology noted low B12 level - has since started daily replacement. Will need this rechecked next labs.

## 2020-05-01 IMAGING — MG DIGITAL SCREENING BREAST BILAT IMPLANT W/ TOMO W/ CAD
8 of 16 series · 8 of 40 positions shown · non-contrast
Comparison: Previous exam(s).

CLINICAL DATA: Screening.

EXAM:
DIGITAL SCREENING BILATERAL MAMMOGRAM WITH IMPLANTS, CAD AND TOMO
The patient has retroglandular implants. Standard and implant
displaced views were performed.

[L CC]
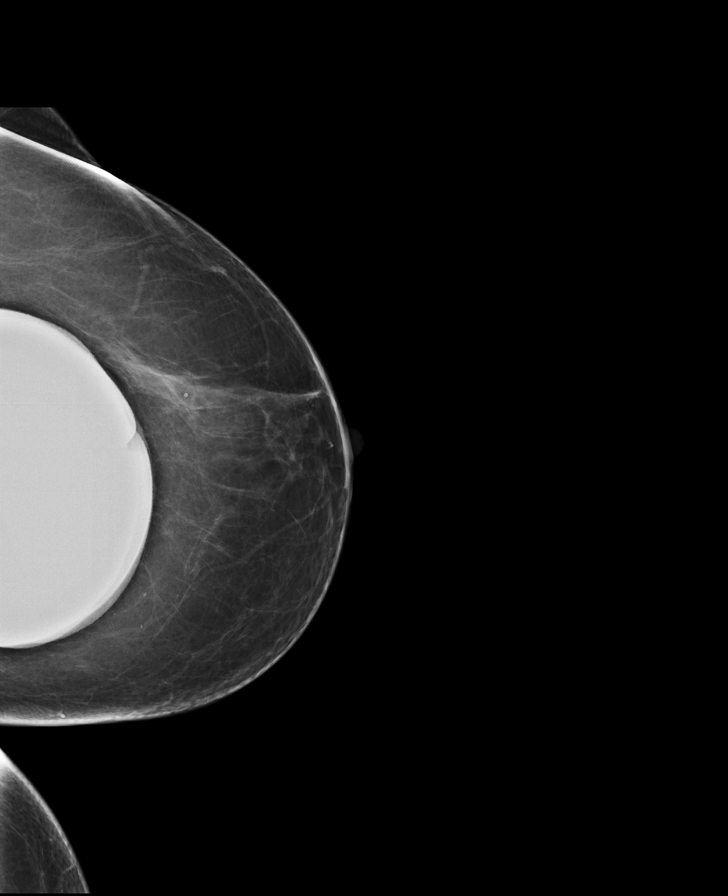

[R MLO]
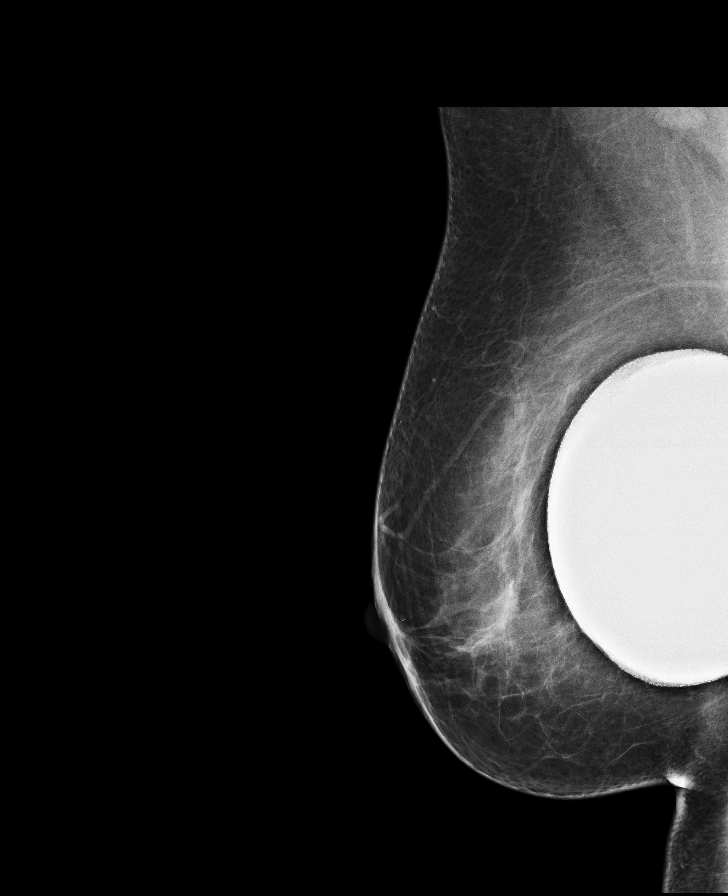

[R CC]
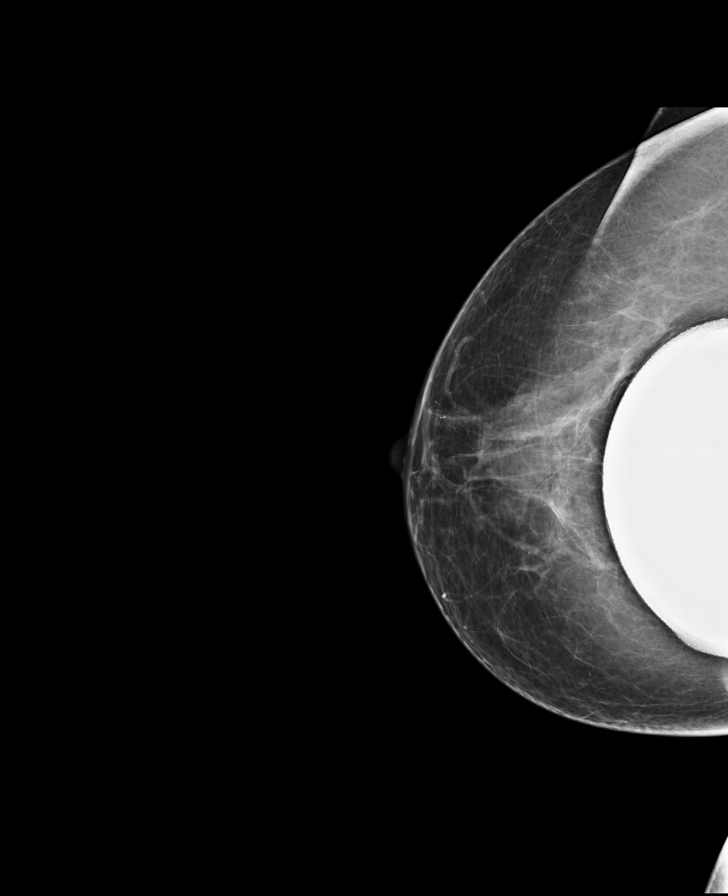

[L MLO]
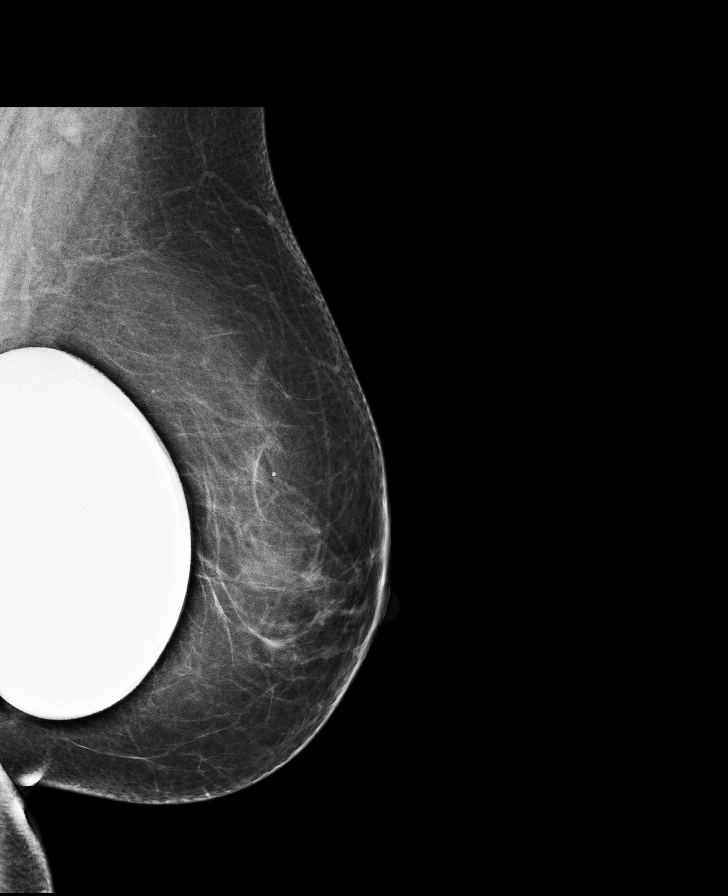

[L CC synth-2D]
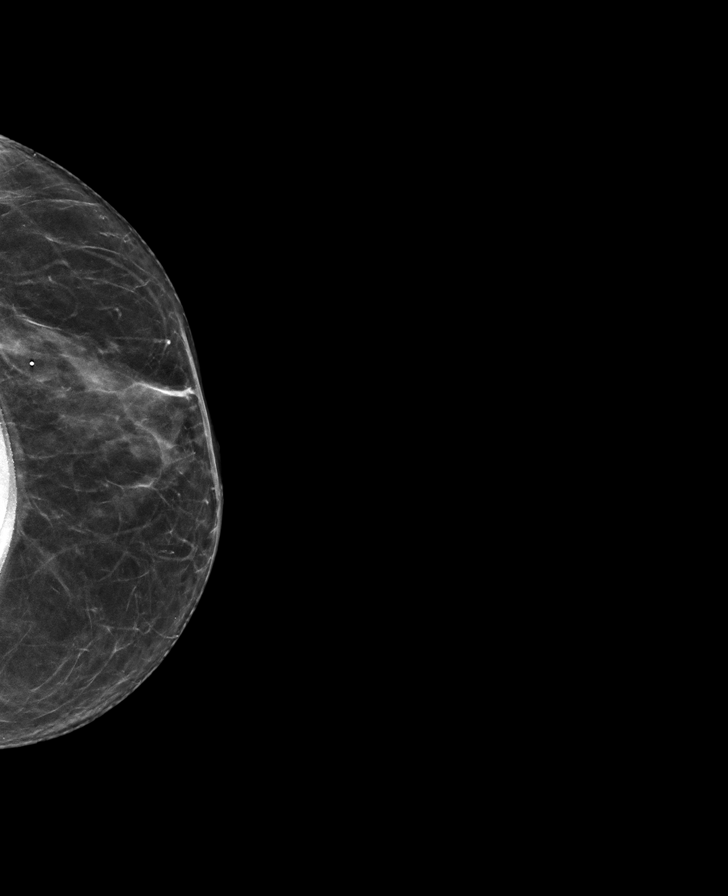

[R MLO synth-2D (1 of 2)]
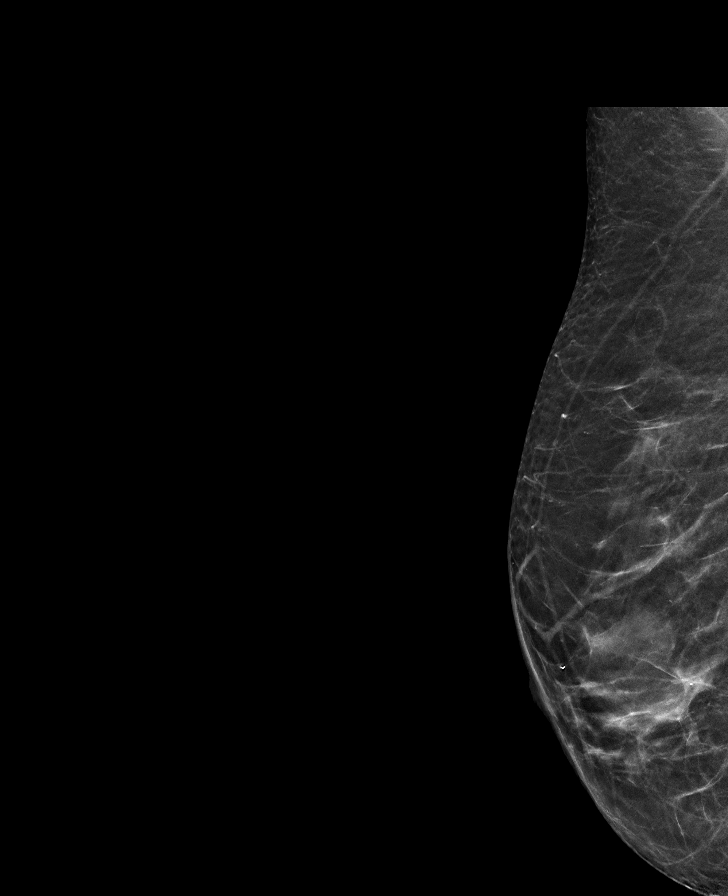

[L MLO synth-2D]
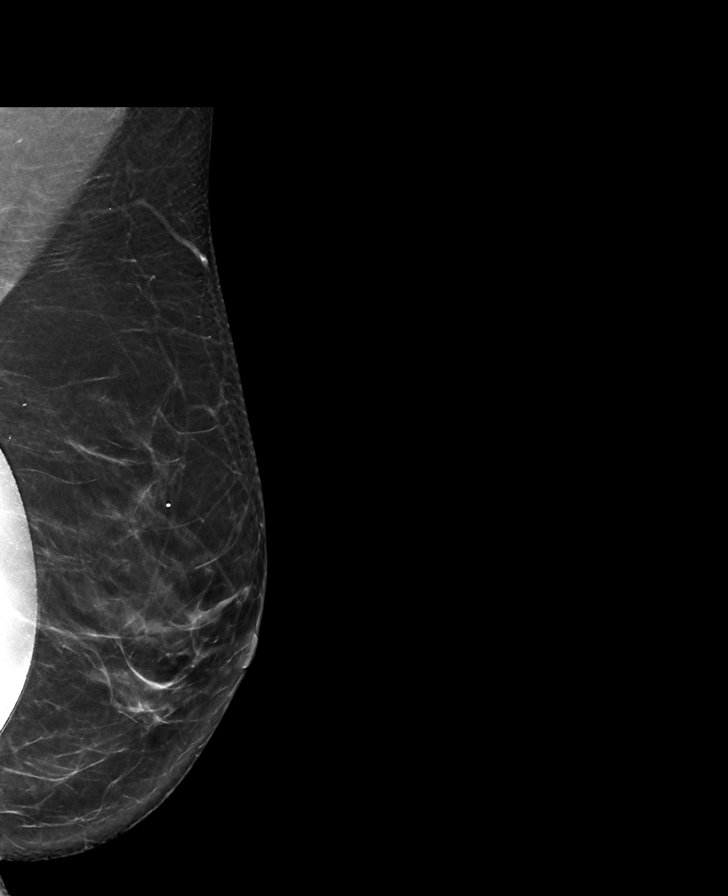

[R MLO synth-2D (2 of 2)]
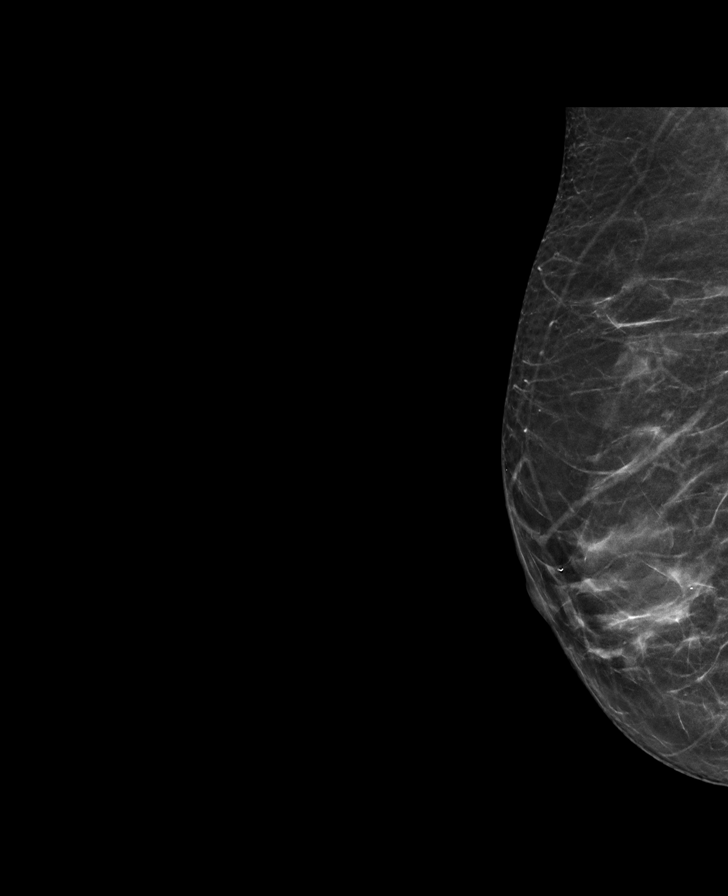

[8 of 40 positions shown; findings below may reference images not displayed]

ACR Breast Density Category b: There are scattered areas of
fibroglandular density.
FINDINGS: There are no findings suspicious for malignancy. Images were
processed with CAD.
IMPRESSION: No mammographic evidence of malignancy. A result letter of this
screening mammogram will be mailed directly to the patient.

RECOMMENDATION:
Screening mammogram in one year. (Code:VZ-A-PDF)

BI-RADS CATEGORY  1:  Negative.

## 2020-05-05 ENCOUNTER — Encounter: Payer: Self-pay | Admitting: Family Medicine

## 2020-05-05 MED ORDER — HYDROCORTISONE-ACETIC ACID 1-2 % OT SOLN: 4.0000 [drp] | Freq: Three times a day (TID) | OTIC | 0 refills | Status: DC

## 2020-05-05 NOTE — Telephone Encounter (Signed)
See patient message. She is calling in to seek advice. Please advise EM

## 2020-05-14 ENCOUNTER — Ambulatory Visit: Payer: Federal, State, Local not specified - PPO

## 2020-06-01 ENCOUNTER — Other Ambulatory Visit: Payer: Self-pay

## 2020-06-01 ENCOUNTER — Ambulatory Visit
Admission: RE | Admit: 2020-06-01 | Discharge: 2020-06-01 | Disposition: A | Discharge status: Home / Self Care | Payer: Federal, State, Local not specified - PPO | Source: Ambulatory Visit | Attending: Obstetrics & Gynecology | Admitting: Obstetrics & Gynecology

## 2020-06-01 DIAGNOSIS — Z1231 Encounter for screening mammogram for malignant neoplasm of breast: Secondary | ICD-10-CM | POA: Diagnosis not present

## 2020-07-12 ENCOUNTER — Other Ambulatory Visit (INDEPENDENT_AMBULATORY_CARE_PROVIDER_SITE_OTHER): Payer: Federal, State, Local not specified - PPO

## 2020-07-12 DIAGNOSIS — Z1211 Encounter for screening for malignant neoplasm of colon: Secondary | ICD-10-CM

## 2020-07-12 LAB — FECAL OCCULT BLOOD, GUAIAC: Fecal Occult Blood: NEGATIVE

## 2020-07-12 LAB — FECAL OCCULT BLOOD, IMMUNOCHEMICAL: Fecal Occult Bld: NEGATIVE

## 2020-07-13 ENCOUNTER — Encounter: Payer: Self-pay | Admitting: Family Medicine

## 2020-07-14 DIAGNOSIS — H0012 Chalazion right lower eyelid: Secondary | ICD-10-CM | POA: Diagnosis not present

## 2020-08-05 ENCOUNTER — Other Ambulatory Visit: Payer: Self-pay | Admitting: Family Medicine

## 2020-08-05 NOTE — Telephone Encounter (Signed)
Last office visit 04/20/2020 for CPE.  Last refilled 01/23/2020 for #30 with no refills.  No future appointments.

## 2020-08-09 NOTE — Telephone Encounter (Signed)
ERx 

## 2020-09-15 ENCOUNTER — Telehealth: Payer: Self-pay | Admitting: Family Medicine

## 2020-09-15 NOTE — Telephone Encounter (Signed)
Krista Perez called in wanted to know if she can get a referral to an ENT with soon appointments due to her itching on her ears are getting worse and when she swallows the pain shoot up in her ear. And she can fill roughness on her ears. And wanted to know if she can get in with Dr. Janeece Riggers Darrold Span Suszanne Conners

## 2020-09-20 NOTE — Telephone Encounter (Signed)
Pt is feeling better with ears.pt will wait on appt with Ezzard Standing to see if anything changes with ears before making an appt with Dr.G

## 2020-10-18 ENCOUNTER — Ambulatory Visit (INDEPENDENT_AMBULATORY_CARE_PROVIDER_SITE_OTHER): Payer: Federal, State, Local not specified - PPO | Admitting: Otolaryngology

## 2020-11-28 ENCOUNTER — Encounter: Payer: Self-pay | Admitting: Family Medicine

## 2020-12-22 DIAGNOSIS — H608X3 Other otitis externa, bilateral: Secondary | ICD-10-CM | POA: Diagnosis not present

## 2021-02-02 ENCOUNTER — Encounter: Payer: Self-pay | Admitting: Plastic Surgery

## 2021-02-02 ENCOUNTER — Ambulatory Visit: Payer: Federal, State, Local not specified - PPO | Admitting: Plastic Surgery

## 2021-02-02 ENCOUNTER — Other Ambulatory Visit: Payer: Self-pay

## 2021-02-02 VITALS — BP 128/78 | HR 77 | Ht 62.0 in | Wt 144.8 lb

## 2021-02-02 DIAGNOSIS — T8544XA Capsular contracture of breast implant, initial encounter: Secondary | ICD-10-CM

## 2021-02-02 DIAGNOSIS — T8549XA Other mechanical complication of breast prosthesis and implant, initial encounter: Secondary | ICD-10-CM | POA: Diagnosis not present

## 2021-02-02 DIAGNOSIS — Z9882 Breast implant status: Secondary | ICD-10-CM | POA: Diagnosis not present

## 2021-02-02 DIAGNOSIS — Z411 Encounter for cosmetic surgery: Secondary | ICD-10-CM

## 2021-02-02 DIAGNOSIS — N644 Mastodynia: Secondary | ICD-10-CM | POA: Diagnosis not present

## 2021-02-02 DIAGNOSIS — T8544XD Capsular contracture of breast implant, subsequent encounter: Secondary | ICD-10-CM | POA: Diagnosis not present

## 2021-02-02 NOTE — Progress Notes (Signed)
Referring Provider Ria Bush, MD [place],  [place] [id number]   CC:  Chief Complaint  Patient presents with   consult      Krista Perez is an 51 y.o. female.  HPI: Patient presents to discuss a problem with her right breast implant.  She had textured silicone implants placed in front of the muscle about 20 years ago for cosmetic reasons.  This was done through an inferior periareolar incision.  She was reasonably happy with them but feel like they are too large for her at this point in her life.  She believes they were about 255 cc in size.  They were placed in [place].  She has felt that over the past few months the right implant is getting firmer and may be moving slightly superior relative to the rest of her breast tissue.  It is painful.  She wants to see if anything could be done about that.  She would like to have the implants removed at this point.  She is also bothered by her abdominal contour and wants to see if liposuction might be an option for her.  No Known Allergies  Outpatient Encounter Medications as of 02/02/2021  Medication Sig   Multiple Vitamins-Minerals (MULTIVITAMIN ADULT) TABS Take 1 tablet by mouth daily.   vitamin B-12 (CYANOCOBALAMIN) 1000 MCG tablet Take 1 tablet (1,000 mcg total) by mouth daily.   acetic acid-hydrocortisone (VOSOL-HC) OTIC solution Place 4 drops into both ears 3 (three) times daily. For 5-7 days   ALPRAZolam (XANAX) 0.5 MG tablet TAKE 1/2 TO 1 TABLET BY MOUTH AT BEDTIME AS NEEDED FOR SLEEP (Patient not taking: Reported on 02/02/2021)   desonide (DESOWEN) 0.05 % cream Apply topically 2 (two) times daily. Apply to AA, no more than 10 days in a row   levocetirizine (XYZAL) 5 MG tablet Take 5 mg by mouth every evening. As needed (Patient not taking: Reported on 02/02/2021)   Probiotic Product (PROBIOTIC-10 PO) Take by mouth.   No facility-administered encounter medications on file as of 02/02/2021.     Past Medical  History:  Diagnosis Date   Chest pain    a. 06/2014 - eval in [place] for sharp c/p;  b. 01/2015 ED vist, neg trop.   COVID-19 virus infection 01/12/2020   Generalized headaches    frequent   History of hepatitis    a. Hep A after contaminated food, cleared   Hx of migraines    Hyperlipidemia    a. 01/2014 TC 215, TG 139, HDL 44, LDL 143.   Palpitations    Vaginal Pap smear, abnormal     Past Surgical History:  Procedure Laterality Date   APPENDECTOMY  1990   AUGMENTATION MAMMAPLASTY Bilateral 2006   BREAST ENHANCEMENT SURGERY     TONSILLECTOMY  1980    Family History  Problem Relation Age of Onset   Cancer Maternal Grandfather 8       lung (smoker)   Cancer Maternal Grandmother 45       leukemia   Pancreatic cancer Mother 78       Terminal cancer dx in her 13's - still alive, lives in [place].   Other Father        alive and well, 73, lives in [place], [place]   Other Brother        alive and well, lives in [place], [place].   Cancer Paternal Uncle        lung (smoker)   Cancer Paternal  Uncle        lung (smoker)   Coronary artery disease Neg Hx    Stroke Neg Hx    Diabetes Neg Hx    Hypertension Neg Hx     Social History   Social History Narrative   Caffeine: rare decaf   Lives with husband and daughter (2009) and 1 dog   Occupation: Works as Academic librarian.  Prior lived in [place], husband is [employer] agent   Activity: gym 3x/wk prior to 06/2014 - much less active now.   Diet: cooks at home.  Good water, fruits/vegetables daily     Review of Systems General: Denies fevers, chills, weight loss CV: Denies chest pain, shortness of breath, palpitations  Physical Exam Vitals with BMI 02/02/2021 04/20/2020 03/23/2020  Height 5\' 2"  5' 1.5" 5\' 1"   Weight 144 lbs 13 oz 143 lbs 8 oz 143 lbs  BMI 26.48 [id number]  Systolic 0000000 [id number]  Diastolic 78 82 75  Pulse 77 85 94    General:  No acute distress,  Alert and oriented, Non-Toxic, Normal speech and affect Breast: She has  reasonable shape and symmetry.  Well-healed inferior periareolar scars.  The right implant does feel firmer and higher than the left.  She does look to have a reasonable amount of breast tissue still in place anterior to the implants and good skin quality. Abdomen: Abdomen is soft nontender.  Small scars from an appendectomy.  She has moderate excess adipose tissue with good skin quality.  Assessment/Plan I do believe that a removal of bilateral implants with capsulectomy is a good option for her.  We discussed the pluses and minuses of a mastopexy.  She is somewhat hesitant to move forward with it and seems to prefer simply removing the implants.  I do believe that she has a reasonable amount of breast tissue that would still be in place and her skin quality is good and off that her cosmetic result would be acceptable with removal of the implants alone.  I would plan to use drains.  We reviewed risks and benefits that include bleeding, infection, damage to surrounding structures need for additional procedures.  I discussed the need for an inframammary crease scar to get the capsule out.  Regarding the abdomen we discussed liposuction versus abdominoplasty.  She is not prepared for abdominoplasty and prefers liposuction at this point.  I did explain the limitations to her and that I would require her skin to contract back down.  I do think that she would have a reasonable result from liposuction alone.  She is fully understanding and would like to move forward.  Cindra Presume 02/02/2021, 5:16 PM

## 2021-02-07 ENCOUNTER — Other Ambulatory Visit: Payer: Self-pay | Admitting: Family Medicine

## 2021-02-07 ENCOUNTER — Encounter: Payer: Self-pay | Admitting: Family Medicine

## 2021-02-08 MED ORDER — CYCLOBENZAPRINE HCL 10 MG PO TABS: 5.0000 mg | ORAL_TABLET | Freq: Two times a day (BID) | ORAL | 0 refills | Status: DC | PRN

## 2021-02-08 NOTE — Telephone Encounter (Signed)
Name of Medication: Alprazolam Name of Pharmacy: [facility] Last Fill or Written Date and Quantity: 08/09/20, #30 Last Office Visit and Type: 04/20/20, CPE Next Office Visit and Type: none Last Controlled Substance Agreement Date: 02/27/17 Last UDS: 02/27/17

## 2021-02-09 MED ORDER — ALPRAZOLAM 0.5 MG PO TABS: 0.2500 mg | ORAL_TABLET | Freq: Every evening | ORAL | 0 refills | Status: DC | PRN

## 2021-02-09 NOTE — Telephone Encounter (Signed)
ERx 

## 2021-02-16 ENCOUNTER — Encounter (HOSPITAL_BASED_OUTPATIENT_CLINIC_OR_DEPARTMENT_OTHER): Payer: Self-pay | Admitting: Plastic Surgery

## 2021-02-16 ENCOUNTER — Other Ambulatory Visit: Payer: Self-pay

## 2021-02-23 DIAGNOSIS — N644 Mastodynia: Secondary | ICD-10-CM | POA: Diagnosis not present

## 2021-02-23 DIAGNOSIS — Z9882 Breast implant status: Secondary | ICD-10-CM | POA: Diagnosis not present

## 2021-02-23 DIAGNOSIS — T8549XD Other mechanical complication of breast prosthesis and implant, subsequent encounter: Secondary | ICD-10-CM | POA: Diagnosis not present

## 2021-02-23 DIAGNOSIS — T8544XD Capsular contracture of breast implant, subsequent encounter: Secondary | ICD-10-CM | POA: Diagnosis not present

## 2021-02-23 NOTE — H&P (Signed)
Subjective:     Patient ID: Krista Perez is a 51 y.o. female.   Follow-up     Here for follow up discussion removal breast implants. Patient underwent subglandular augmentation 2003 in [place], [place] round base textured implants high profile 255 ml [id number] bilateral. (Standard texture). This was prior to pregnancy. Notes since time of surgery has had pain left lower outer breast in area of drain tubes. Over last few years has developed pain over right with increasing tightness of breast. [place] completed in office with right intracapsular rupture noted, no seroma surrounding implants. Desires removal implants. States she did not pursue surgery in past as she is scared of surgery, reports woke up during tonsillectomy as child during surgery.   Prior to augmentation B cup, directly following augmentation C cup. Current D cup Wt prior to pregnancy 110 lb, current stable 140s.   MMG 05/2020 normal. Mother with pancreatic ca. No FH breast or ovarian ca.  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 Lives with spouse and daughter.   Review of Systems      Objective:   Physical Exam  Cardiovascular: Normal rate, regular rhythm and normal heart sounds.  Pulmonary/Chest: Effort normal and breath sounds normal.  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   Reviewed IMF incision. Reviewed OP surgery drains. Reviewed post op limitations including time off work. Counseled will send capsules for pathology.   Reviewed risk of BIA ALCL with textured implants and previously provided ASPS FAQs regarding this.   Reviewed she will lose the upper pole fulness present with implant removal. She does have some ptosis and we have previously discussed mastopexy with anchor type scars. She could elect for this at same time of implant removal or in future if desired. Patient declines this at this time.   Additional risks including but not limited to bleeding seroma hematoma infection asymmetry unacceptable cosmetic result asymmetry need for additional procedures damage to adjacent structures blood clots in legs or lungs reviewed.   Completed ASPS consent for implant removal.    Drain teaching completed. Rx for tramadol given.

## 2021-02-23 NOTE — Progress Notes (Signed)

## 2021-02-25 ENCOUNTER — Encounter (HOSPITAL_BASED_OUTPATIENT_CLINIC_OR_DEPARTMENT_OTHER)
Admission: RE | Disposition: A | Discharge status: Home / Self Care | Payer: Self-pay | Source: Home / Self Care | Attending: Plastic Surgery

## 2021-02-25 ENCOUNTER — Other Ambulatory Visit: Payer: Self-pay

## 2021-02-25 ENCOUNTER — Ambulatory Visit (HOSPITAL_BASED_OUTPATIENT_CLINIC_OR_DEPARTMENT_OTHER)
Admission: RE | Admit: 2021-02-25 | Discharge: 2021-02-25 | Disposition: A | Discharge status: Home / Self Care | Payer: Federal, State, Local not specified - PPO | Attending: Plastic Surgery | Admitting: Plastic Surgery

## 2021-02-25 ENCOUNTER — Ambulatory Visit (HOSPITAL_BASED_OUTPATIENT_CLINIC_OR_DEPARTMENT_OTHER): Payer: Federal, State, Local not specified - PPO | Admitting: Anesthesiology

## 2021-02-25 ENCOUNTER — Encounter (HOSPITAL_BASED_OUTPATIENT_CLINIC_OR_DEPARTMENT_OTHER): Payer: Self-pay | Admitting: Plastic Surgery

## 2021-02-25 DIAGNOSIS — X58XXXA Exposure to other specified factors, initial encounter: Secondary | ICD-10-CM | POA: Diagnosis not present

## 2021-02-25 DIAGNOSIS — N61 Mastitis without abscess: Secondary | ICD-10-CM | POA: Diagnosis not present

## 2021-02-25 DIAGNOSIS — N644 Mastodynia: Secondary | ICD-10-CM | POA: Insufficient documentation

## 2021-02-25 DIAGNOSIS — T8543XA Leakage of breast prosthesis and implant, initial encounter: Secondary | ICD-10-CM | POA: Insufficient documentation

## 2021-02-25 DIAGNOSIS — R921 Mammographic calcification found on diagnostic imaging of breast: Secondary | ICD-10-CM | POA: Diagnosis not present

## 2021-02-25 DIAGNOSIS — N6032 Fibrosclerosis of left breast: Secondary | ICD-10-CM | POA: Diagnosis not present

## 2021-02-25 DIAGNOSIS — N6031 Fibrosclerosis of right breast: Secondary | ICD-10-CM | POA: Diagnosis not present

## 2021-02-25 DIAGNOSIS — Z45812 Encounter for adjustment or removal of left breast implant: Secondary | ICD-10-CM | POA: Diagnosis not present

## 2021-02-25 DIAGNOSIS — Z45811 Encounter for adjustment or removal of right breast implant: Secondary | ICD-10-CM | POA: Diagnosis not present

## 2021-02-25 DIAGNOSIS — T8544XA Capsular contracture of breast implant, initial encounter: Secondary | ICD-10-CM | POA: Insufficient documentation

## 2021-02-25 HISTORY — DX: Dyspnea, unspecified: R06.00

## 2021-02-25 HISTORY — DX: Other complications of anesthesia, initial encounter: T88.59XA

## 2021-02-25 HISTORY — PX: BREAST IMPLANT REMOVAL: SHX5361

## 2021-02-25 SURGERY — REMOVAL, IMPLANT, BREAST
Anesthesia: General | Site: Breast | Laterality: Bilateral

## 2021-02-25 MED ORDER — OXYCODONE HCL 5 MG PO TABS
5.0000 mg | ORAL_TABLET | Freq: Once | ORAL | Status: AC | PRN
  Administered 2021-02-25: 5 mg via ORAL

## 2021-02-25 MED ORDER — CHLORHEXIDINE GLUCONATE CLOTH 2 % EX PADS: 6.0000 | MEDICATED_PAD | Freq: Once | CUTANEOUS | Status: DC

## 2021-02-25 MED ORDER — FENTANYL CITRATE (PF) 100 MCG/2ML IJ SOLN
25.0000 ug | INTRAMUSCULAR | Status: DC | PRN
  Administered 2021-02-25 (×2): 50 ug via INTRAVENOUS

## 2021-02-25 MED ORDER — DEXAMETHASONE SODIUM PHOSPHATE 10 MG/ML IJ SOLN
INTRAMUSCULAR | Status: AC
  Filled 2021-02-25: qty 1

## 2021-02-25 MED ORDER — PROPOFOL 10 MG/ML IV BOLUS
INTRAVENOUS | Status: AC
  Filled 2021-02-25: qty 20

## 2021-02-25 MED ORDER — OXYCODONE HCL 5 MG/5ML PO SOLN
5.0000 mg | Freq: Once | ORAL | Status: AC | PRN
Start: 2021-02-25 — End: 2021-02-25

## 2021-02-25 MED ORDER — MIDAZOLAM HCL 2 MG/2ML IJ SOLN
INTRAMUSCULAR | Status: AC
  Filled 2021-02-25: qty 2

## 2021-02-25 MED ORDER — CELECOXIB 200 MG PO CAPS
200.0000 mg | ORAL_CAPSULE | ORAL | Status: AC
  Administered 2021-02-25: 200 mg via ORAL

## 2021-02-25 MED ORDER — OXYCODONE HCL 5 MG PO TABS
ORAL_TABLET | ORAL | Status: AC
  Filled 2021-02-25: qty 1

## 2021-02-25 MED ORDER — BUPIVACAINE-EPINEPHRINE 0.25% -1:200000 IJ SOLN
INTRAMUSCULAR | Status: DC | PRN
  Administered 2021-02-25: 30 mL

## 2021-02-25 MED ORDER — FENTANYL CITRATE (PF) 100 MCG/2ML IJ SOLN
INTRAMUSCULAR | Status: AC
  Filled 2021-02-25: qty 2

## 2021-02-25 MED ORDER — ACETAMINOPHEN 500 MG PO TABS
ORAL_TABLET | ORAL | Status: AC
  Filled 2021-02-25: qty 3

## 2021-02-25 MED ORDER — LIDOCAINE 2% (20 MG/ML) 5 ML SYRINGE
INTRAMUSCULAR | Status: AC
  Filled 2021-02-25: qty 5

## 2021-02-25 MED ORDER — GABAPENTIN 300 MG PO CAPS
ORAL_CAPSULE | ORAL | Status: AC
  Filled 2021-02-25: qty 1

## 2021-02-25 MED ORDER — DEXAMETHASONE SODIUM PHOSPHATE 4 MG/ML IJ SOLN
INTRAMUSCULAR | Status: DC | PRN
Start: 2021-02-25 — End: 2021-02-25
  Administered 2021-02-25: 5 mg via INTRAVENOUS

## 2021-02-25 MED ORDER — ONDANSETRON HCL 4 MG/2ML IJ SOLN
INTRAMUSCULAR | Status: AC
  Filled 2021-02-25: qty 2

## 2021-02-25 MED ORDER — PHENYLEPHRINE HCL (PRESSORS) 10 MG/ML IV SOLN
INTRAVENOUS | Status: DC | PRN
  Administered 2021-02-25: 80 ug via INTRAVENOUS

## 2021-02-25 MED ORDER — LIDOCAINE HCL (CARDIAC) PF 100 MG/5ML IV SOSY
PREFILLED_SYRINGE | INTRAVENOUS | Status: DC | PRN
  Administered 2021-02-25: 60 mg via INTRAVENOUS

## 2021-02-25 MED ORDER — ACETAMINOPHEN 160 MG/5ML PO SOLN: 325.0000 mg | ORAL | Status: DC | PRN

## 2021-02-25 MED ORDER — CEFAZOLIN SODIUM-DEXTROSE 2-4 GM/100ML-% IV SOLN
2.0000 g | INTRAVENOUS | Status: AC
  Administered 2021-02-25: 2 g via INTRAVENOUS

## 2021-02-25 MED ORDER — AMISULPRIDE (ANTIEMETIC) 5 MG/2ML IV SOLN
INTRAVENOUS | Status: AC
  Filled 2021-02-25: qty 4

## 2021-02-25 MED ORDER — GABAPENTIN 300 MG PO CAPS
300.0000 mg | ORAL_CAPSULE | ORAL | Status: AC
  Administered 2021-02-25: 300 mg via ORAL

## 2021-02-25 MED ORDER — CELECOXIB 200 MG PO CAPS
ORAL_CAPSULE | ORAL | Status: AC
  Filled 2021-02-25: qty 1

## 2021-02-25 MED ORDER — AMISULPRIDE (ANTIEMETIC) 5 MG/2ML IV SOLN
10.0000 mg | Freq: Once | INTRAVENOUS | Status: AC | PRN
  Administered 2021-02-25: 10 mg via INTRAVENOUS

## 2021-02-25 MED ORDER — ONDANSETRON HCL 4 MG/2ML IJ SOLN
INTRAMUSCULAR | Status: DC | PRN
  Administered 2021-02-25: 4 mg via INTRAVENOUS

## 2021-02-25 MED ORDER — ACETAMINOPHEN 325 MG PO TABS: 325.0000 mg | ORAL_TABLET | ORAL | Status: DC | PRN

## 2021-02-25 MED ORDER — MIDAZOLAM HCL 5 MG/5ML IJ SOLN
INTRAMUSCULAR | Status: DC | PRN
  Administered 2021-02-25: 2 mg via INTRAVENOUS

## 2021-02-25 MED ORDER — CEFAZOLIN SODIUM-DEXTROSE 2-4 GM/100ML-% IV SOLN
INTRAVENOUS | Status: AC
  Filled 2021-02-25: qty 100

## 2021-02-25 MED ORDER — PROMETHAZINE HCL 25 MG/ML IJ SOLN: 6.2500 mg | INTRAMUSCULAR | Status: DC | PRN

## 2021-02-25 MED ORDER — ACETAMINOPHEN 10 MG/ML IV SOLN: 1000.0000 mg | Freq: Once | INTRAVENOUS | Status: DC | PRN

## 2021-02-25 MED ORDER — PROPOFOL 10 MG/ML IV BOLUS
INTRAVENOUS | Status: DC | PRN
  Administered 2021-02-25: 200 mg via INTRAVENOUS

## 2021-02-25 MED ORDER — LACTATED RINGERS IV SOLN: INTRAVENOUS | Status: DC

## 2021-02-25 MED ORDER — ACETAMINOPHEN 500 MG PO TABS
1000.0000 mg | ORAL_TABLET | ORAL | Status: AC
  Administered 2021-02-25: 1000 mg via ORAL

## 2021-02-25 MED ORDER — FENTANYL CITRATE (PF) 100 MCG/2ML IJ SOLN
INTRAMUSCULAR | Status: DC | PRN
  Administered 2021-02-25 (×3): 25 ug via INTRAVENOUS
  Administered 2021-02-25: 50 ug via INTRAVENOUS
  Administered 2021-02-25 (×3): 25 ug via INTRAVENOUS

## 2021-02-25 SURGICAL SUPPLY — 46 items
ADH SKN CLS APL DERMABOND .7 (GAUZE/BANDAGES/DRESSINGS) ×1
APL PRP STRL LF DISP 70% ISPRP (MISCELLANEOUS) ×1
BINDER BREAST LRG (GAUZE/BANDAGES/DRESSINGS) ×1 IMPLANT
BLADE SURG 10 STRL SS (BLADE) ×2 IMPLANT
CANISTER SUCT 1200ML W/VALVE (MISCELLANEOUS) ×2 IMPLANT
CHLORAPREP W/TINT 26 (MISCELLANEOUS) ×2 IMPLANT
COVER BACK TABLE 60X90IN (DRAPES) ×2 IMPLANT
COVER MAYO STAND STRL (DRAPES) ×2 IMPLANT
DERMABOND ADVANCED (GAUZE/BANDAGES/DRESSINGS) ×1
DERMABOND ADVANCED .7 DNX12 (GAUZE/BANDAGES/DRESSINGS) ×1 IMPLANT
DRAIN CHANNEL 15F RND FF W/TCR (WOUND CARE) ×2 IMPLANT
DRAPE INCISE IOBAN 66X45 STRL (DRAPES) ×1 IMPLANT
DRAPE TOP ARMCOVERS (MISCELLANEOUS) ×2 IMPLANT
DRAPE U-SHAPE 76X120 STRL (DRAPES) ×2 IMPLANT
DRAPE UTILITY XL STRL (DRAPES) ×2 IMPLANT
DRSG PAD ABDOMINAL 8X10 ST (GAUZE/BANDAGES/DRESSINGS) ×3 IMPLANT
ELECT BLADE 4.0 EZ CLEAN MEGAD (MISCELLANEOUS) ×2
ELECT COATED BLADE 2.86 ST (ELECTRODE) ×2 IMPLANT
ELECT REM PT RETURN 9FT ADLT (ELECTROSURGICAL) ×2
ELECTRODE BLDE 4.0 EZ CLN MEGD (MISCELLANEOUS) IMPLANT
ELECTRODE REM PT RTRN 9FT ADLT (ELECTROSURGICAL) ×1 IMPLANT
EVACUATOR SILICONE 100CC (DRAIN) ×2 IMPLANT
GLOVE SURG HYDRASOFT LTX SZ5.5 (GLOVE) ×2 IMPLANT
GLOVE SURG POLYISO LF SZ6.5 (GLOVE) ×1 IMPLANT
GLOVE SURG UNDER POLY LF SZ6.5 (GLOVE) ×1 IMPLANT
GOWN STRL REUS W/ TWL LRG LVL3 (GOWN DISPOSABLE) ×2 IMPLANT
GOWN STRL REUS W/ TWL XL LVL3 (GOWN DISPOSABLE) IMPLANT
GOWN STRL REUS W/TWL LRG LVL3 (GOWN DISPOSABLE) ×2
GOWN STRL REUS W/TWL XL LVL3 (GOWN DISPOSABLE) ×2
NDL HYPO 25X1 1.5 SAFETY (NEEDLE) IMPLANT
NEEDLE HYPO 25X1 1.5 SAFETY (NEEDLE) ×2 IMPLANT
NS IRRIG 1000ML POUR BTL (IV SOLUTION) ×1 IMPLANT
PACK BASIN DAY SURGERY FS (CUSTOM PROCEDURE TRAY) ×2 IMPLANT
PENCIL SMOKE EVACUATOR (MISCELLANEOUS) ×2 IMPLANT
PIN SAFETY STERILE (MISCELLANEOUS) ×1 IMPLANT
SHEET MEDIUM DRAPE 40X70 STRL (DRAPES) ×2 IMPLANT
SLEEVE SCD COMPRESS KNEE MED (STOCKING) ×2 IMPLANT
SPONGE T-LAP 18X18 ~~LOC~~+RFID (SPONGE) ×4 IMPLANT
STAPLER VISISTAT 35W (STAPLE) ×2 IMPLANT
SUT ETHILON 2 0 FS 18 (SUTURE) ×1 IMPLANT
SYR BULB IRRIG 60ML STRL (SYRINGE) ×2 IMPLANT
SYR CONTROL 10ML LL (SYRINGE) ×1 IMPLANT
TOWEL GREEN STERILE FF (TOWEL DISPOSABLE) ×4 IMPLANT
TUBE CONNECTING 20X1/4 (TUBING) ×3 IMPLANT
UNDERPAD 30X36 HEAVY ABSORB (UNDERPADS AND DIAPERS) ×4 IMPLANT
YANKAUER SUCT BULB TIP NO VENT (SUCTIONS) ×2 IMPLANT

## 2021-02-25 NOTE — Interval H&P Note (Signed)
History and Physical Interval Note:  02/25/2021 12:16 PM  Krista Perez  has presented today for surgery, with the diagnosis of mechanical complication breast prosthesis, bilateral capsular contracture.  The various methods of treatment have been discussed with the patient and family. After consideration of risks, benefits and other options for treatment, the patient has consented to  Procedure(s): REMOVAL OF BILATERAL BREAST IMPLANTS AND CAPSULECTOMIES (Bilateral) as a surgical intervention.  The patient's history has been reviewed, patient examined, no change in status, stable for surgery.  I have reviewed the patient's chart and labs.  Questions were answered to the patient's satisfaction.     Irean Hong Therisa Mennella

## 2021-02-25 NOTE — Transfer of Care (Signed)
Immediate Anesthesia Transfer of Care Note  Patient: Ambry Santagata  Procedure(s) Performed: REMOVAL OF BILATERAL BREAST IMPLANTS AND CAPSULECTOMIES (Bilateral: Breast)  Patient Location: PACU  Anesthesia Type:General  Level of Consciousness: awake, alert  and oriented  Airway & Oxygen Therapy: Patient Spontanous Breathing and Patient connected to face mask oxygen  Post-op Assessment: Report given to RN and Post -op Vital signs reviewed and stable  Post vital signs: Reviewed and stable  Last Vitals:  Vitals Value Taken Time  BP    Temp    Pulse 98 02/25/21 1445  Resp    SpO2 100 % 02/25/21 1445  Vitals shown include unvalidated device data.  Last Pain:  Vitals:   02/25/21 1211  TempSrc: Oral  PainSc: 0-No pain         Complications: No notable events documented.

## 2021-02-25 NOTE — Anesthesia Postprocedure Evaluation (Deleted)
Anesthesia Post Note  Patient: Krista Perez  Procedure(s) Performed: REMOVAL OF BILATERAL BREAST IMPLANTS AND CAPSULECTOMIES (Bilateral: Breast)     Patient location during evaluation: PACU Anesthesia Type: General Level of consciousness: awake and alert Pain management: pain level controlled Vital Signs Assessment: post-procedure vital signs reviewed and stable Respiratory status: spontaneous breathing, nonlabored ventilation, respiratory function stable and patient connected to nasal cannula oxygen Cardiovascular status: blood pressure returned to baseline and stable Postop Assessment: no apparent nausea or vomiting Anesthetic complications: no   No notable events documented.  Last Vitals:  Vitals:   02/25/21 1211  BP: (!) 155/91  Pulse: 75  Resp: 15  Temp: 36.8 C  SpO2: 100%    Last Pain:  Vitals:   02/25/21 1211  TempSrc: Oral  PainSc: 0-No pain                 Shelton Silvas

## 2021-02-25 NOTE — Anesthesia Postprocedure Evaluation (Signed)
Anesthesia Post Note  Patient: Krista Perez  Procedure(s) Performed: REMOVAL OF BILATERAL BREAST IMPLANTS AND CAPSULECTOMIES (Bilateral: Breast)     Patient location during evaluation: PACU Anesthesia Type: General Level of consciousness: awake and alert Pain management: pain level controlled Vital Signs Assessment: post-procedure vital signs reviewed and stable Respiratory status: spontaneous breathing, nonlabored ventilation, respiratory function stable and patient connected to nasal cannula oxygen Cardiovascular status: blood pressure returned to baseline and stable Postop Assessment: no apparent nausea or vomiting Anesthetic complications: no   No notable events documented.  Last Vitals:  Vitals:   02/25/21 1515 02/25/21 1530  BP: (!) 143/90 (!) 149/79  Pulse: 87 78  Resp: 17 18  Temp:    SpO2: 96% 97%    Last Pain:  Vitals:   02/25/21 1515  TempSrc:   PainSc: 3                  Shelton Silvas

## 2021-02-25 NOTE — Anesthesia Procedure Notes (Signed)
Procedure Name: LMA Insertion Date/Time: 02/25/2021 1:04 PM Performed by: Burna Cash, CRNA Pre-anesthesia Checklist: Patient identified, Emergency Drugs available, Suction available and Patient being monitored Patient Re-evaluated:Patient Re-evaluated prior to induction Oxygen Delivery Method: Circle system utilized Preoxygenation: Pre-oxygenation with 100% oxygen Induction Type: IV induction Ventilation: Mask ventilation without difficulty LMA: LMA inserted LMA Size: 4.0 Number of attempts: 1 Airway Equipment and Method: Bite block Placement Confirmation: positive ETCO2 Tube secured with: Tape Dental Injury: Teeth and Oropharynx as per pre-operative assessment

## 2021-02-25 NOTE — Anesthesia Preprocedure Evaluation (Addendum)
Anesthesia Evaluation  Patient identified by MRN, date of birth, ID band Patient awake    Reviewed: Allergy & Precautions, NPO status , Patient's Chart, lab work & pertinent test results  Airway Mallampati: I  TM Distance: >3 FB Neck ROM: Full    Dental  (+) Teeth Intact, Dental Advisory Given   Pulmonary neg pulmonary ROS,    breath sounds clear to auscultation       Cardiovascular hypertension,  Rhythm:Regular Rate:Normal     Neuro/Psych  Headaches, PSYCHIATRIC DISORDERS    GI/Hepatic negative GI ROS, Neg liver ROS,   Endo/Other  negative endocrine ROS  Renal/GU negative Renal ROS     Musculoskeletal negative musculoskeletal ROS (+)   Abdominal Normal abdominal exam  (+)   Peds  Hematology negative hematology ROS (+)   Anesthesia Other Findings   Reproductive/Obstetrics                             Anesthesia Physical Anesthesia Plan  ASA: 2  Anesthesia Plan: General   Post-op Pain Management:    Induction: Intravenous  PONV Risk Score and Plan: 4 or greater and Ondansetron, Dexamethasone, Midazolam and Scopolamine patch - Pre-op  Airway Management Planned: LMA and Oral ETT  Additional Equipment: None  Intra-op Plan:   Post-operative Plan: Extubation in OR  Informed Consent:   Plan Discussed with: CRNA  Anesthesia Plan Comments:         Anesthesia Quick Evaluation

## 2021-02-25 NOTE — Discharge Instructions (Addendum)

## 2021-02-25 NOTE — Op Note (Signed)
Operative Note   DATE OF OPERATION: 2.3.23  LOCATION: [facility]-outpatient  SURGICAL DIVISION: Plastic Surgery  PREOPERATIVE DIAGNOSES:  1. Mechanical complication right breast implant 2. Right breast capsular contracture 3. History augmentation mammaplasty  POSTOPERATIVE DIAGNOSES:  same  PROCEDURE:  1. Removal bilateral breast implants, total capsulectomies  SURGEON: Irene Limbo MD MBA  ASSISTANT: none  ANESTHESIA:  General.   EBL: 30 ml  COMPLICATIONS: None immediate.   INDICATIONS FOR PROCEDURE:  The patient, Krista Perez, is a 51 y.o. female born on Feb 16, 1970, is here for removal bilateral subglandular textured silicone breast implants. Patient has right capsular contracture present and rupture noted on ultrasound.   FINDINGS: Removed intact Silimed textured breast implant from left breast. Right breast implant noted to have intracapsular rupture, Silimed textured breast implant. No seroma noted in either capsule.   DESCRIPTION OF PROCEDURE:  The patient's operative site was marked with the patient in the preoperative area. The patient was taken to the operating room. SCDs were placed and IV antibiotics were given. The patient's operative site was prepped and draped in a sterile fashion. A time out was performed and all information was confirmed to be correct. Incision made in left inframammary fold scar and carried through superficial fascia to implant capsule. Dissection completed over anterior and posterior surface capsule. Capsule incised and intact implant removed. Capsulectomy completed. Cavity irrigated with saline. Hemostasis ensured. Local anesthetic infiltrated. 15 Fr JP placed and secured to skin with 2-0 nylon. Incision closed with 3-0 vicryl in superficial fascia, 4-0 vicryl in dermis, and 4-0 monocryl subcuticular skin closure.   I then directed attention to right breast. Incision made in right inframammary fold scar and carried through superficial  fascia to implant capsule. Dissection completed over anterior and posterior surface capsule. Complete capsulectomy performed and implant removed with capsule from patient. Capsule then incised and implant noted to have intracapsular rupture. Cavity irrigated with saline. Hemostasis ensured. Local anesthetic infiltrated. 15 Fr JP placed and secured to skin with 2-0 nylon. Incision closed with 3-0 vicryl in superficial fascia, 4-0 vicryl in dermis, and 4-0 monocryl subcuticular skin closure. Tissue adhesive applied to skin followed by dry dressing, breast binder.   The patient was allowed to wake from anesthesia, extubated and taken to the recovery room in satisfactory condition.   SPECIMENS: right and left breast capsule  DRAINS: 15 Fr JP in right and left breast

## 2021-02-26 ENCOUNTER — Encounter: Payer: Self-pay | Admitting: Radiology

## 2021-02-28 ENCOUNTER — Encounter (HOSPITAL_BASED_OUTPATIENT_CLINIC_OR_DEPARTMENT_OTHER): Payer: Self-pay | Admitting: Plastic Surgery

## 2021-02-28 LAB — SURGICAL PATHOLOGY

## 2021-03-04 ENCOUNTER — Encounter: Payer: Self-pay | Admitting: Radiology

## 2021-04-29 ENCOUNTER — Other Ambulatory Visit: Payer: Self-pay | Admitting: Obstetrics & Gynecology

## 2021-04-29 DIAGNOSIS — Z1231 Encounter for screening mammogram for malignant neoplasm of breast: Secondary | ICD-10-CM

## 2021-05-23 ENCOUNTER — Other Ambulatory Visit: Payer: Self-pay | Admitting: Family Medicine

## 2021-05-23 NOTE — Telephone Encounter (Signed)
Refill request Alprazolam ?Last refill 02/09/21 #30 ?Last office visit 04/20/20  ?No upcoming appointment scheduled ?

## 2021-05-23 NOTE — Telephone Encounter (Signed)
ERx 

## 2021-06-06 ENCOUNTER — Ambulatory Visit
Admission: RE | Admit: 2021-06-06 | Discharge: 2021-06-06 | Disposition: A | Discharge status: Home / Self Care | Payer: Federal, State, Local not specified - PPO | Source: Ambulatory Visit | Attending: Obstetrics & Gynecology | Admitting: Obstetrics & Gynecology

## 2021-06-06 DIAGNOSIS — Z1231 Encounter for screening mammogram for malignant neoplasm of breast: Secondary | ICD-10-CM

## 2021-06-15 DIAGNOSIS — T8549XD Other mechanical complication of breast prosthesis and implant, subsequent encounter: Secondary | ICD-10-CM | POA: Diagnosis not present

## 2021-07-05 ENCOUNTER — Telehealth: Payer: Self-pay | Admitting: Plastic Surgery

## 2021-07-05 NOTE — Telephone Encounter (Signed)
Patient called to follow up on bill received for $60 for DOS 02/02/2021. Patient stated she called to address this in the past and was told it was being removed. However, she's still receiving bills with "Final warning" on them.   Spoke with Practice Admin Misty Stanley; stated bill was sent to insurance and the patient doesn't owe [place] $60. Misty Stanley sent communication to the billing department for them to remove the charge.   Patient expressed concern about the bill affecting her credit score. Advise patient to give it some time and to contact us if she receives another bill after a month.

## 2021-08-09 ENCOUNTER — Other Ambulatory Visit: Payer: Self-pay | Admitting: Family Medicine

## 2021-08-10 NOTE — Telephone Encounter (Signed)
ERx 

## 2021-08-10 NOTE — Telephone Encounter (Signed)
Name of Medication: Alprazolam Name of Pharmacy: [facility] Last Fill or Written Date and Quantity: 05/23/21, #30 Last Office Visit and Type: 04/20/20, CPE Next Office Visit and Type: none Last Controlled Substance Agreement Date: 02/27/17 Last UDS: 02/27/17

## 2021-09-22 ENCOUNTER — Other Ambulatory Visit: Payer: Self-pay | Admitting: Family Medicine

## 2021-09-22 DIAGNOSIS — R739 Hyperglycemia, unspecified: Secondary | ICD-10-CM

## 2021-09-22 DIAGNOSIS — E782 Mixed hyperlipidemia: Secondary | ICD-10-CM

## 2021-09-22 DIAGNOSIS — E538 Deficiency of other specified B group vitamins: Secondary | ICD-10-CM

## 2021-09-28 ENCOUNTER — Other Ambulatory Visit (INDEPENDENT_AMBULATORY_CARE_PROVIDER_SITE_OTHER): Payer: Federal, State, Local not specified - PPO

## 2021-09-28 DIAGNOSIS — E538 Deficiency of other specified B group vitamins: Secondary | ICD-10-CM | POA: Diagnosis not present

## 2021-09-28 DIAGNOSIS — R739 Hyperglycemia, unspecified: Secondary | ICD-10-CM | POA: Diagnosis not present

## 2021-09-28 DIAGNOSIS — E782 Mixed hyperlipidemia: Secondary | ICD-10-CM

## 2021-09-28 LAB — LIPID PANEL
Cholesterol: 247 mg/dL — ABNORMAL HIGH (ref 0–200)
HDL: 43.3 mg/dL (ref >39.00)
NonHDL: 203.43
Total CHOL/HDL Ratio: 6
Triglycerides: 223 mg/dL — ABNORMAL HIGH (ref 0.0–149.0)
VLDL: 44.6 mg/dL — ABNORMAL HIGH (ref 0.0–40.0)

## 2021-09-28 LAB — LDL CHOLESTEROL, DIRECT: Direct LDL: 171 mg/dL

## 2021-09-28 LAB — COMPREHENSIVE METABOLIC PANEL
ALT: 29 U/L (ref 0–35)
AST: 24 U/L (ref 0–37)
Albumin: 4.3 g/dL (ref 3.5–5.2)
Alkaline Phosphatase: 74 U/L (ref 39–117)
BUN: 12 mg/dL (ref 6–23)
CO2: 26 mEq/L (ref 19–32)
Calcium: 9.7 mg/dL (ref 8.4–10.5)
Chloride: 102 mEq/L (ref 96–112)
Creatinine, Ser: 0.62 mg/dL (ref 0.40–1.20)
GFR: 102.37 mL/min (ref >60.00)
Glucose, Bld: 90 mg/dL (ref 70–99)
Potassium: 4.1 mEq/L (ref 3.5–5.1)
Sodium: 138 mEq/L (ref 135–145)
Total Bilirubin: 0.4 mg/dL (ref 0.2–1.2)
Total Protein: 7.8 g/dL (ref 6.0–8.3)

## 2021-09-28 LAB — HEMOGLOBIN A1C: Hgb A1c MFr Bld: 6.2 % (ref 4.6–6.5)

## 2021-09-28 LAB — VITAMIN B12: Vitamin B-12: 248 pg/mL (ref 211–911)

## 2021-09-29 LAB — APOLIPOPROTEIN B: Apolipoprotein B: 159 mg/dL — ABNORMAL HIGH (ref <90)

## 2021-10-05 ENCOUNTER — Ambulatory Visit (INDEPENDENT_AMBULATORY_CARE_PROVIDER_SITE_OTHER): Payer: Federal, State, Local not specified - PPO | Admitting: Family Medicine

## 2021-10-05 ENCOUNTER — Encounter: Payer: Self-pay | Admitting: Family Medicine

## 2021-10-05 VITALS — BP 132/74 | HR 79 | Temp 97.5°F | Ht 61.5 in | Wt 144.0 lb

## 2021-10-05 DIAGNOSIS — Z780 Asymptomatic menopausal state: Secondary | ICD-10-CM

## 2021-10-05 DIAGNOSIS — Z0000 Encounter for general adult medical examination without abnormal findings: Secondary | ICD-10-CM

## 2021-10-05 DIAGNOSIS — E782 Mixed hyperlipidemia: Secondary | ICD-10-CM | POA: Diagnosis not present

## 2021-10-05 DIAGNOSIS — Z8669 Personal history of other diseases of the nervous system and sense organs: Secondary | ICD-10-CM | POA: Diagnosis not present

## 2021-10-05 DIAGNOSIS — Z1211 Encounter for screening for malignant neoplasm of colon: Secondary | ICD-10-CM | POA: Diagnosis not present

## 2021-10-05 DIAGNOSIS — E538 Deficiency of other specified B group vitamins: Secondary | ICD-10-CM

## 2021-10-05 MED ORDER — ALPRAZOLAM 0.5 MG PO TABS: 0.2500 mg | ORAL_TABLET | Freq: Every evening | ORAL | 0 refills | Status: DC | PRN

## 2021-10-05 NOTE — Assessment & Plan Note (Signed)
Chronic, deteriorated, with elevated apolipoprotein B levels - discussed increased atherogenic risk, rec low chol diet, increased fiber (she will start daily soluble fiber supplement like metamucil).  The 10-year ASCVD risk score (Arnett DK, et al., 2019) is: 2.7%   Values used to calculate the score:     Age: 51 years     Sex: Female     Is Non-Hispanic African American: No     Diabetic: No     Tobacco smoker: No     Systolic Blood Pressure: 132 mmHg     Is BP treated: No     HDL Cholesterol: 43.3 mg/dL     Total Cholesterol: 247 mg/dL

## 2021-10-05 NOTE — Progress Notes (Signed)
Patient ID: Krista Perez, female    DOB: [birth year], 51 y.o.   MRN: [id number]  This visit was conducted in person.  BP 132/74   Pulse 79   Temp (!) 97.5 F (36.4 C) (Temporal)   Ht 5' 1.5" (1.562 m)   Wt 144 lb (65.3 kg)   LMP 09/11/2012   SpO2 99%   BMI 26.77 kg/m    CC: CPE Subjective:   HPI: Krista Perez is a 51 y.o. female presenting on 10/05/2021 for Annual Exam   S/p bilat breast implant removal 02/2021 by plastic surgery. No fmhx CAD/CVA.   Preventative: Colon cancer screening - discussed. Would like iFOB. Well woman - with GYN Dr Harolyn Rutherford - ASCUS with negative HR HPV on 12/08/2013. Pap 11/2014 WNL. ASCUS with negative HR HPV on 01/2017 and 01/2018. Trial estrace cream [facility] LMP ~2016 - menopausal.  Lung cancer screening - not eligible  Flu shot - declines due to worsening migraines COVD vaccine Pfizer 02/2019, 03/2019, no booster Td 2011. Tdap - declines  Completed hep B. Shingrix - discussed, declines Seat belt use discussed.  Sunscreen use discussed. Wants mole on back checked.  Sleep - averaging 7 hours/night  Non smoker Alcohol - rare  Dentist - q6 mo  Eye exam yearly  Caffeine: rare decaf   Lives with husband and daughter (2009) and 1 dog   Occupation: housewife, Forensic scientist. Prior lived in [place], husband is [employer] agent  Activity: swimming regularly Diet: cooks at home. good water, fruits/vegetables daily      Relevant past medical, surgical, family and social history reviewed and updated as indicated. Interim medical history since our last visit reviewed. Allergies and medications reviewed and updated. Outpatient Medications Prior to Visit  Medication Sig Dispense Refill   ALPRAZolam (XANAX) 0.5 MG tablet TAKE 0.5-1 TABLETS (0.25-0.5 MG TOTAL) BY MOUTH AT BEDTIME AS NEEDED. FOR SLEEP 30 tablet 0   acetic acid-hydrocortisone (VOSOL-HC) OTIC solution Place 4 drops into both ears 3 (three) times daily.  For 5-7 days 10 mL 0   levocetirizine (XYZAL) 5 MG tablet Take 5 mg by mouth every evening. As needed     Multiple Vitamins-Minerals (MULTIVITAMIN ADULT) TABS Take 1 tablet by mouth daily.     Probiotic Product (PROBIOTIC-10 PO) Take by mouth.     vitamin B-12 (CYANOCOBALAMIN) 1000 MCG tablet Take 1 tablet (1,000 mcg total) by mouth daily.     No facility-administered medications prior to visit.     Per HPI unless specifically indicated in ROS section below Review of Systems  Constitutional:  Negative for activity change, appetite change, chills, fatigue, fever and unexpected weight change.  HENT:  Negative for hearing loss.   Eyes:  Negative for visual disturbance.  Respiratory:  Negative for cough, chest tightness, shortness of breath and wheezing.   Cardiovascular:  Negative for chest pain, palpitations and leg swelling.  Gastrointestinal:  Positive for abdominal pain (occ LLQ). Negative for abdominal distention, blood in stool, constipation, diarrhea, nausea and vomiting.  Genitourinary:  Negative for difficulty urinating and hematuria.  Musculoskeletal:  Negative for arthralgias, myalgias and neck pain.  Skin:  Negative for rash.  Neurological:  Negative for dizziness, seizures, syncope and headaches.  Hematological:  Negative for adenopathy. Does not bruise/bleed easily.  Psychiatric/Behavioral:  Negative for dysphoric mood. The patient is not nervous/anxious.     Objective:  BP 132/74   Pulse 79   Temp (!) 97.5 F (36.4 C) (Temporal)  Ht 5' 1.5" (1.562 m)   Wt 144 lb (65.3 kg)   LMP 09/11/2012   SpO2 99%   BMI 26.77 kg/m   Wt Readings from Last 3 Encounters:  10/05/21 144 lb (65.3 kg)  02/25/21 145 lb 4.5 oz (65.9 kg)  02/02/21 144 lb 12.8 oz (65.7 kg)      Physical Exam Vitals and nursing note reviewed.  Constitutional:      Appearance: Normal appearance. She is not ill-appearing.  HENT:     Head: Normocephalic and atraumatic.     Right Ear: Tympanic membrane,  ear canal and external ear normal. There is no impacted cerumen.     Left Ear: Tympanic membrane, ear canal and external ear normal. There is no impacted cerumen.  Eyes:     General:        Right eye: No discharge.        Left eye: No discharge.     Extraocular Movements: Extraocular movements intact.     Conjunctiva/sclera: Conjunctivae normal.     Pupils: Pupils are equal, round, and reactive to light.  Neck:     Thyroid: No thyroid mass or thyromegaly.  Cardiovascular:     Rate and Rhythm: Normal rate and regular rhythm.     Pulses: Normal pulses.     Heart sounds: Normal heart sounds. No murmur heard. Pulmonary:     Effort: Pulmonary effort is normal. No respiratory distress.     Breath sounds: Normal breath sounds. No wheezing, rhonchi or rales.  Abdominal:     General: Bowel sounds are normal. There is no distension.     Palpations: Abdomen is soft. There is no mass.     Tenderness: There is no abdominal tenderness. There is no guarding or rebound.     Hernia: No hernia is present.  Musculoskeletal:     Cervical back: Normal range of motion and neck supple. No rigidity.     Right lower leg: No edema.     Left lower leg: No edema.  Lymphadenopathy:     Cervical: No cervical adenopathy.  Skin:    General: Skin is warm and dry.     Findings: No rash.  Neurological:     General: No focal deficit present.     Mental Status: She is alert. Mental status is at baseline.  Psychiatric:        Mood and Affect: Mood normal.        Behavior: Behavior normal.       Results for orders placed or performed in visit on 09/28/21  Hemoglobin A1c  Result Value Ref Range   Hgb A1c MFr Bld 6.2 4.6 - 6.5 %  Apolipoprotein B  Result Value Ref Range   Apolipoprotein B 159 (H) <90 mg/dL  Comprehensive metabolic panel  Result Value Ref Range   Sodium 138 135 - 145 mEq/L   Potassium 4.1 3.5 - 5.1 mEq/L   Chloride 102 96 - 112 mEq/L   CO2 26 19 - 32 mEq/L   Glucose, Bld 90 70 - 99 mg/dL    BUN 12 6 - 23 mg/dL   Creatinine, Ser 0.62 0.40 - 1.20 mg/dL   Total Bilirubin 0.4 0.2 - 1.2 mg/dL   Alkaline Phosphatase 74 39 - 117 U/L   AST 24 0 - 37 U/L   ALT 29 0 - 35 U/L   Total Protein 7.8 6.0 - 8.3 g/dL   Albumin 4.3 3.5 - 5.2 g/dL   GFR 102.37 >60.00 mL/min  Calcium 9.7 8.4 - 10.5 mg/dL  Lipid panel  Result Value Ref Range   Cholesterol 247 (H) 0 - 200 mg/dL   Triglycerides 223.0 (H) 0.0 - 149.0 mg/dL   HDL 43.30 >39.00 mg/dL   VLDL 44.6 (H) 0.0 - 40.0 mg/dL   Total CHOL/HDL Ratio 6    NonHDL 203.43   Vitamin B12  Result Value Ref Range   Vitamin B-12 248 211 - 911 pg/mL  LDL cholesterol, direct  Result Value Ref Range   Direct LDL 171.0 mg/dL   Lab Results  Component Value Date   TSH 1.77 03/23/2020    Assessment & Plan:   Problem List Items Addressed This Visit     Health maintenance examination - Primary (Chronic)    Preventative protocols reviewed and updated unless pt declined. Discussed healthy diet and lifestyle.       Hx of migraines    Stable period off medication.       HLD (hyperlipidemia)    Chronic, deteriorated, with elevated apolipoprotein B levels - discussed increased atherogenic risk, rec low chol diet, increased fiber (she will start daily soluble fiber supplement like metamucil).  The 10-year ASCVD risk score (Arnett DK, et al., 2019) is: 2.7%   Values used to calculate the score:     Age: 16 years     Sex: Female     Is Non-Hispanic African American: No     Diabetic: No     Tobacco smoker: No     Systolic Blood Pressure: 440 mmHg     Is BP treated: No     HDL Cholesterol: 43.3 mg/dL     Total Cholesterol: 247 mg/dL       Relevant Orders   Lipid panel   Apolipoprotein B   Postmenopausal   Vitamin B12 deficiency    Low normal levels - rec start 1066mg vit B12 daily.       Relevant Orders   Vitamin B12   Other Visit Diagnoses     Special screening for malignant neoplasms, colon       Relevant Orders   Fecal  occult blood, imunochemical        Meds ordered this encounter  Medications   ALPRAZolam (XANAX) 0.5 MG tablet    Sig: Take 0.5-1 tablets (0.25-0.5 mg total) by mouth at bedtime as needed. for sleep    Dispense:  30 tablet    Refill:  0    Not to exceed 5 additional fills before 11/19/2021   Orders Placed This Encounter  Procedures   Fecal occult blood, imunochemical    Standing Status:   Future    Standing Expiration Date:   10/06/2022   Lipid panel    Standing Status:   Future    Standing Expiration Date:   10/06/2022   Apolipoprotein B    Standing Status:   Future    Standing Expiration Date:   10/06/2022   Vitamin B12    Standing Status:   Future    Standing Expiration Date:   10/06/2022    Patient instructions: Alprazolam refilled Pase por laboratorio para stool kit.  Consider shingrix vaccine series and updated tetanus shot.  SDebroah Ballerfibra en la dieta (metamucil), legumbres, verduras. Repetir laboratorios en 6 meses (ayunas).  Gusto verla hoy Regresar en 1 ao para proximo examen fisico.   Follow up plan: Return in about 1 year (around 10/06/2022) for annual exam, prior fasting for blood work.  JRia Bush MD

## 2021-10-05 NOTE — Assessment & Plan Note (Signed)
Stable period off medication.  

## 2021-10-05 NOTE — Patient Instructions (Addendum)
Alprazolam refilled Pase por laboratorio para stool kit.  Consider shingrix vaccine series and updated tetanus shot.  Debroah Baller fibra en la dieta (metamucil), legumbres, verduras. Repetir laboratorios en 6 meses (ayunas).  Gusto verla hoy Regresar en 1 ao para proximo examen fisico.   [place] Maintenance for Postmenopausal Women La menopausia es un proceso normal en el cual la capacidad de quedar embarazada llega a su fin. Este proceso ocurre lentamente a lo largo de un perodo de muchos meses o aos; por lo general, entre los 52 y los 77 aos. La menopausia es completa cuando no se ha tenido el perodo menstrual por 12 meses. Es importante hablar con el mdico sobre algunas de las enfermedades ms comunes que afectan a las mujeres despus de la menopausia (mujeres posmenopusicas). Estas incluyen la enfermedad cardaca, el cncer y la prdida sea (osteoporosis). Adoptar un estilo de vida saludable y recibir atencin preventiva pueden ayudar a promover la salud y Musician. Las medidas que tome tambin pueden reducir las probabilidades de Actor algunas de estas enfermedades frecuentes. Cules son los signos y sntomas de la menopausia? Durante la menopausia, puede tener los siguientes sntomas: Nurse, learning disability. Estos pueden ser moderados o intensos. Sudoracin nocturna. Disminucin del deseo sexual. Cambios en el estado de nimo. Dolores de [place]. Cansancio (fatiga). Irritabilidad. Problemas de memoria. Problemas para quedarse dormida o para seguir durmiendo. Hable con el mdico sobre las opciones de tratamiento para sus sntomas. Necesito terapia de reemplazo hormonal? La terapia de reemplazo hormonal es eficaz para tratar los sntomas causados por la menopausia, como los acaloramientos y las sudoraciones nocturnas. La reposicin hormonal conlleva ciertos riesgos, especialmente a medida que una mujer envejece. Si est pensando  en usar estrgeno o estrgeno con progestina, analice los beneficios y los riesgos con el mdico. Cmo puedo reducir el riesgo de tener enfermedad cardaca y accidente cerebrovascular? A medida que se envejece, aumenta el riesgo de enfermedad cardaca, infarto de miocardio y accidente cerebrovascular. Una de las causas puede ser un cambio en las hormonas del cuerpo durante la menopausia. Esto puede afectar la forma en que el organismo procesa las [place], los triglicridos y el colesterol de su dieta. El infarto de miocardio y el accidente cerebrovascular son emergencias mdicas. Hay muchas cosas que se pueden hacer para ayudar a prevenir la enfermedad cardaca y el accidente cerebrovascular. Contrlese la presin arterial La hipertensin arterial causa enfermedades cardacas y [place] el riesgo de accidente cerebrovascular. Es ms probable que esto se manifieste en las personas que tienen lecturas de presin arterial alta o tienen sobrepeso. Hgase controlar la presin arterial: Cada 3 a 5 aos si tiene entre 18 y 55 aos. Todos los aos si es mayor de 40 aos. Consuma una dieta saludable  Consuma una dieta que incluya muchas verduras, frutas, productos lcteos con bajo contenido de [place] y Advertising account planner. No consuma muchos alimentos ricos en grasas slidas, azcares agregados o sodio. Haga ejercicio con regularidad Haga ejercicio con regularidad. Esta es una de las prcticas ms importantes que puede hacer por su salud. La mayora de los adultos deben seguir estas pautas: Intente realizar al menos 150 minutos de actividad fsica por semana. El ejercicio debe aumentar la frecuencia cardaca y Nature conservation officer transpirar (ejercicio de intensidad moderada). Intente hacer ejercicios de elongacin por lo menos dos veces por semana. Agrguelos al plan de ejercicio de intensidad moderada. Pase menos tiempo sentada. Incluso la actividad fsica ligera puede ser beneficiosa. Otros consejos Trabaje con su mdico  para Science writer o Statistician peso saludable. No consuma ningn producto que contenga nicotina o tabaco. Estos productos incluyen cigarrillos, tabaco para Higher education careers adviser y aparatos de vapeo, como los Psychologist, sport and exercise. Si necesita ayuda para dejar de consumir estos productos, consulte al mdico. Conozca sus cifras. Pdale al mdico que le controle el colesterol y el nivel sanguneo de azcar en la sangre (glucosa). Siga hacindose anlisis de [employer] se lo haya indicado el mdico. Necesito realizarme pruebas de deteccin del cncer? Segn sus antecedentes mdicos y familiares, es posible que deba realizarse pruebas de deteccin del cncer en diferentes etapas de la vida. Esto puede incluir pruebas de deteccin de lo siguiente: Cncer de mama. Cncer de cuello uterino. Cncer de pulmn. Cncer colorrectal. Cul es mi riesgo de tener osteoporosis? Despus de la menopausia, puede correr un riesgo ms alto de tener osteoporosis. La osteoporosis es una afeccin en la cual la destruccin de la masa sea ocurre con mayor rapidez que su formacin. Para ayudar a prevenir esta afeccin o las fracturas seas que pueden ocurrir a causa de [place], usted puede tomar las siguientes medidas: Si tiene entre 11 y 47 aos, reciba como mnimo 1000 mg de calcio y 600 unidades internacionales (UI) de vitamina D por Training and development officer. Si tiene ms de 50 aos pero menos de 70 aos, reciba como mnimo 1200 mg de calcio y al menos 600 unidades internacionales (UI) de vitamina D por da. Si tiene ms de 70 aos, reciba como mnimo 1200 mg de calcio y al menos 800 unidades internacionales (UI) de vitamina D por da. Fumar y beber alcohol en exceso aumentan el riesgo de osteoporosis. Consuma alimentos ricos en calcio y vitamina D, y haga ejercicios con soporte de peso varias veces a la [place], como se lo haya indicado el mdico. De qu manera la menopausia afecta mi salud mental? La depresin puede presentarse a cualquier edad, pero es ms  frecuente a medida que una persona envejece. Los sntomas comunes de depresin incluyen lo siguiente: Sensacin de depresin. Cambios en los patrones de sueo. Cambios en el apetito o en los hbitos de alimentacin. Sensacin de falta general de motivacin o placer al [employer] actividades que sola disfrutar. Crisis frecuentes de llanto. Hable con el mdico si cree que est experimentando alguno de estos sntomas. Indicaciones generales Visite a su mdico para hacerse exmenes de bienestar peridicos y aplicarse vacunas. Puede incluir: Programar exmenes peridicos dentales, de la salud y de Public librarian. Recibir y [employer]. Estos incluyen los siguientes: Human resources officer. Aplquese esta vacuna todos los aos antes de que comience la temporada de gripe. Vacuna contra la neumona. Vacuna contra el herpes. Vacuna contra el ttanos, la difteria y la tos Dyann Ruddle (Tdap). El mdico tambin puede recomendarle que se aplique otras vacunas. Notifique a su mdico si alguna vez ha sido vctima de abuso o si no se siente seguro en su hogar. Resumen La menopausia es un proceso normal en el cual la capacidad de quedar embarazada llega a su fin. Esta condicin causa acaloramientos, sudoraciones nocturnas, disminucin del inters en el sexo, cambios en el estado de nimo, dolores de [place] o falta de sueo. El tratamiento de esta afeccin puede incluir una terapia de reemplazo hormonal. Tome medidas para mantenerse 13, entre ellas, hacer ejercicio con regularidad, seguir una dieta saludable, controlar su peso y medirse la presin arterial y los niveles de Dispensing optician. Hgase pruebas para Film/video editor y depresin. Asegrese de [place] con todas las  vacunas. Esta informacin no tiene Marine scientist el consejo del mdico. Asegrese de hacerle al mdico cualquier pregunta que tenga. Document Revised: 06/15/2020 Document Reviewed: 06/15/2020 Elsevier Patient Education   [place].

## 2021-10-05 NOTE — Assessment & Plan Note (Signed)
Preventative protocols reviewed and updated unless pt declined. Discussed healthy diet and lifestyle.  

## 2021-10-05 NOTE — Assessment & Plan Note (Signed)
Low normal levels - rec start vit B12 daily.

## 2021-10-12 ENCOUNTER — Other Ambulatory Visit: Payer: Self-pay | Admitting: Radiology

## 2021-10-12 DIAGNOSIS — Z1211 Encounter for screening for malignant neoplasm of colon: Secondary | ICD-10-CM

## 2021-10-12 LAB — FECAL OCCULT BLOOD, IMMUNOCHEMICAL: Fecal Occult Bld: NEGATIVE

## 2021-10-26 ENCOUNTER — Encounter: Payer: Self-pay | Admitting: Obstetrics & Gynecology

## 2021-11-03 ENCOUNTER — Ambulatory Visit: Payer: Federal, State, Local not specified - PPO | Admitting: Obstetrics & Gynecology

## 2021-12-06 ENCOUNTER — Encounter: Payer: Self-pay | Admitting: Obstetrics & Gynecology

## 2021-12-06 ENCOUNTER — Ambulatory Visit (INDEPENDENT_AMBULATORY_CARE_PROVIDER_SITE_OTHER): Payer: Federal, State, Local not specified - PPO | Admitting: Obstetrics & Gynecology

## 2021-12-06 ENCOUNTER — Other Ambulatory Visit (HOSPITAL_COMMUNITY)
Admission: RE | Admit: 2021-12-06 | Discharge: 2021-12-06 | Disposition: A | Discharge status: Home / Self Care | Payer: Federal, State, Local not specified - PPO | Source: Ambulatory Visit | Attending: Obstetrics & Gynecology | Admitting: Obstetrics & Gynecology

## 2021-12-06 VITALS — BP 111/80 | HR 109 | Ht 62.0 in | Wt 143.0 lb

## 2021-12-06 DIAGNOSIS — Z01419 Encounter for gynecological examination (general) (routine) without abnormal findings: Secondary | ICD-10-CM

## 2021-12-06 DIAGNOSIS — N763 Subacute and chronic vulvitis: Secondary | ICD-10-CM

## 2021-12-06 DIAGNOSIS — Z1211 Encounter for screening for malignant neoplasm of colon: Secondary | ICD-10-CM | POA: Diagnosis not present

## 2021-12-06 MED ORDER — CLOBETASOL PROPIONATE 0.05 % EX OINT: TOPICAL_OINTMENT | CUTANEOUS | 5 refills | Status: DC

## 2021-12-06 NOTE — Progress Notes (Unsigned)
GYNECOLOGY ANNUAL PREVENTATIVE CARE ENCOUNTER NOTE  History:     Gavyn Zoss is a 51 y.o. [phone] female here for a routine annual gynecologic exam.  Current complaints: dryness on outside of vagina, no significant itching, just very dry.  Makes intercourse uncomfortable   Denies abnormal vaginal bleeding, discharge, pelvic pain, problems with intercourse or other gynecologic concerns.    Gynecologic History Patient's last menstrual period was 09/11/2012. Contraception: post menopausal status Last Pap: 02/27/2019. Result was normal with negative HPV Last Mammogram: 06/06/2021.  Result was normal Last Colonoscopy: Never had one but had negative fecal occult blood tests every year, last one 10/12/2021.  Obstetric History OB History  Gravida Para Term Preterm AB Living  3 1 0 1 2 1   SAB IAB Ectopic Multiple Live Births  2       1    # Outcome Date GA Lbr Len/2nd Weight Sex Delivery Anes PTL Lv  3 Preterm 07/06/07 [redacted]w[redacted]d   F CS-LTranv   LIV     Complications: Severe preeclampsia  2 SAB           1 SAB             Past Medical History:  Diagnosis Date   Chest pain    a. 06/2014 - eval in [place] for sharp c/p;  b. 01/2015 ED vist, neg trop.   Complication of anesthesia    woke up with migraine per patient   COVID-19 virus infection 01/12/2020   Dyspnea    Generalized headaches    frequent   History of hepatitis    a. Hep A after contaminated food, cleared   Hx of migraines    Hyperlipidemia    a. 01/2014 TC 215, TG 139, HDL 44, LDL 143.   Palpitations    Vaginal Pap smear, abnormal     Past Surgical History:  Procedure Laterality Date   APPENDECTOMY  1990   AUGMENTATION MAMMAPLASTY Bilateral 2006   BREAST ENHANCEMENT SURGERY     BREAST IMPLANT REMOVAL Bilateral 02/25/2021   Procedure: REMOVAL OF BILATERAL BREAST IMPLANTS AND CAPSULECTOMIES;  Surgeon: 04/25/2021, MD;  Location: [facility];  Service: Plastics;  Laterality: Bilateral;   TONSILLECTOMY  1980     Current Outpatient Medications on File Prior to Visit  Medication Sig Dispense Refill   ALPRAZolam (XANAX) 0.5 MG tablet Take 0.5-1 tablets (0.25-0.5 mg total) by mouth at bedtime as needed. for sleep 30 tablet 0   No current facility-administered medications on file prior to visit.    No Known Allergies  Social History:  reports that she has never smoked. She has never used smokeless tobacco. She reports current alcohol use. She reports that she does not use drugs.  Family History  Problem Relation Age of Onset   Cancer Maternal Grandfather 15       lung (smoker)   Cancer Maternal Grandmother 56       leukemia   Pancreatic cancer Mother 75       Terminal cancer dx in her 81's - still alive, lives in 65's.   Other Father        alive and well, 46, lives in [place], [place]   Other Brother        alive and well, lives in [place], [place].   Cancer Paternal Uncle        lung (smoker)   Cancer Paternal Uncle        lung (smoker)   Coronary artery disease  Neg Hx    Stroke Neg Hx    Diabetes Neg Hx    Hypertension Neg Hx     The following portions of the patient's history were reviewed and updated as appropriate: allergies, current medications, past family history, past medical history, past social history, past surgical history and problem list.  Review of Systems Pertinent items noted in HPI and remainder of comprehensive ROS otherwise negative.  Physical Exam:  BP 111/80   Pulse (!) 109   Ht 5\' 2"  (1.575 m)   Wt 143 lb (64.9 kg)   LMP 09/11/2012   BMI 26.16 kg/m  CONSTITUTIONAL: Well-developed, well-nourished female in no acute distress.  HENT:  Normocephalic, atraumatic, External right and left ear normal.  EYES: Conjunctivae and EOM are normal. Pupils are equal, round, and reactive to light. No scleral icterus.  NECK: Normal range of motion, supple, no masses.  Normal thyroid.  SKIN: Skin is warm and dry. No rash noted. Not diaphoretic. No erythema. No  pallor. MUSCULOSKELETAL: Normal range of motion. No tenderness.  No cyanosis, clubbing, or edema. NEUROLOGIC: Alert and oriented to person, place, and time. Normal reflexes, muscle tone coordination.  PSYCHIATRIC: Normal mood and affect. Normal behavior. Normal judgment and thought content. CARDIOVASCULAR: Normal heart rate noted, regular rhythm RESPIRATORY: Clear to auscultation bilaterally. Effort and breath sounds normal, no problems with respiration noted. BREASTS: Symmetric in size. No masses, tenderness, skin changes, nipple drainage, or lymphadenopathy bilaterally. Performed in the presence of a chaperone. ABDOMEN: Soft, no distention noted.  No tenderness, rebound or guarding.  PELVIC: Significant diffuse, thickened, reddened, leathery epidermis of bilateral labia majora, extending to perianal region. No ulcerations or discrete lesions.  Normal urethral meatus; normal appearing vaginal mucosa and cervix.  No abnormal vaginal discharge noted.  Pap smear obtained.  Normal uterine size, no other palpable masses, no uterine or adnexal tenderness.  Performed in the presence of a chaperone.   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  This was discussed with patient.  Will treat with course of topical steroids for now, and evaluate response in about a couple of months.  She was told to come in for worsening symptoms. - clobetasol ointment (TEMOVATE) 0.05 %; Apply to affected area every night for 4 weeks, then every other day for 4 weeks and then twice a week for 4 weeks or until resolution.  Dispense: 60 g; Refill: 5  2. Colon cancer screening Discussed need for colon cancer screening over the age 13. Fecal occult blood samples are not enough. Colonoscopy recommended as this is the gold standard. Patient declined colonoscopy, so discussed Cologuard, which is better than the occult blood test.   Emphasized that positive Cologuard tests will need to  be follow up with diagnostic colonoscopy. Cologuard ordered - Cologuard  3. Well woman exam with routine gynecological exam - Cytology - PAP Will follow up results of pap smear and manage accordingly. Mammogram is up to date. Routine preventative health maintenance measures emphasized. Please refer to After Visit Summary for other counseling recommendations.      Verita Schneiders, MD, [place] [facility] [employer], [place]

## 2021-12-06 NOTE — Patient Instructions (Addendum)
Hyaluronic acid moisturizers for vulva, HYALO-GYN, [place] etc  to be considered after steroid treatment

## 2021-12-08 LAB — CYTOLOGY - PAP
Comment: NEGATIVE
Diagnosis: NEGATIVE
High risk HPV: NEGATIVE

## 2021-12-30 LAB — COLOGUARD: COLOGUARD: NEGATIVE

## 2022-01-03 ENCOUNTER — Ambulatory Visit: Payer: Federal, State, Local not specified - PPO | Admitting: Obstetrics & Gynecology

## 2022-01-30 ENCOUNTER — Other Ambulatory Visit: Payer: Self-pay | Admitting: Family Medicine

## 2022-01-31 NOTE — Telephone Encounter (Signed)
ERx 

## 2022-01-31 NOTE — Telephone Encounter (Signed)
Name of Medication: Alprazolam Name of Pharmacy: [facility] Last Fill or Written Date and Quantity: 10/05/21, #30 Last Office Visit and Type: 10/05/21, CPE Next Office Visit and Type: 10/10/22, CPE Last Controlled Substance Agreement Date: 02/27/17 Last UDS: 02/27/17  Flexeril last filled: 02/08/21, #30.

## 2022-03-06 ENCOUNTER — Ambulatory Visit: Payer: Federal, State, Local not specified - PPO | Admitting: Family Medicine

## 2022-03-07 ENCOUNTER — Encounter: Payer: Self-pay | Admitting: Family Medicine

## 2022-03-07 ENCOUNTER — Telehealth: Payer: Self-pay | Admitting: Family Medicine

## 2022-03-07 ENCOUNTER — Telehealth: Payer: Self-pay | Admitting: Nurse Practitioner

## 2022-03-07 ENCOUNTER — Ambulatory Visit: Payer: Federal, State, Local not specified - PPO | Admitting: Nurse Practitioner

## 2022-03-07 ENCOUNTER — Encounter: Payer: Self-pay | Admitting: Nurse Practitioner

## 2022-03-07 VITALS — BP 120/74 | HR 90 | Temp 98.0°F | Resp 16 | Ht 62.0 in | Wt 142.0 lb

## 2022-03-07 DIAGNOSIS — R112 Nausea with vomiting, unspecified: Secondary | ICD-10-CM | POA: Insufficient documentation

## 2022-03-07 DIAGNOSIS — E782 Mixed hyperlipidemia: Secondary | ICD-10-CM | POA: Diagnosis not present

## 2022-03-07 DIAGNOSIS — I10 Essential (primary) hypertension: Secondary | ICD-10-CM

## 2022-03-07 DIAGNOSIS — R42 Dizziness and giddiness: Secondary | ICD-10-CM

## 2022-03-07 DIAGNOSIS — H5711 Ocular pain, right eye: Secondary | ICD-10-CM

## 2022-03-07 DIAGNOSIS — E538 Deficiency of other specified B group vitamins: Secondary | ICD-10-CM | POA: Diagnosis not present

## 2022-03-07 LAB — CBC
HCT: 43.9 % (ref 36.0–46.0)
Hemoglobin: 14.8 g/dL (ref 12.0–15.0)
MCHC: 33.7 g/dL (ref 30.0–36.0)
MCV: 82.3 fl (ref 78.0–100.0)
Platelets: 291 10*3/uL (ref 150.0–400.0)
RBC: 5.33 Mil/uL — ABNORMAL HIGH (ref 3.87–5.11)
RDW: 13.9 % (ref 11.5–15.5)
WBC: 6.1 10*3/uL (ref 4.0–10.5)

## 2022-03-07 LAB — VITAMIN B12: Vitamin B-12: 255 pg/mL (ref 211–911)

## 2022-03-07 LAB — LIPID PANEL
Cholesterol: 284 mg/dL — ABNORMAL HIGH (ref 0–200)
HDL: 48.1 mg/dL (ref >39.00)
LDL Cholesterol: 201 mg/dL — ABNORMAL HIGH (ref 0–99)
NonHDL: 235.95
Total CHOL/HDL Ratio: 6
Triglycerides: 177 mg/dL — ABNORMAL HIGH (ref 0.0–149.0)
VLDL: 35.4 mg/dL (ref 0.0–40.0)

## 2022-03-07 LAB — COMPREHENSIVE METABOLIC PANEL
ALT: 22 U/L (ref 0–35)
AST: 17 U/L (ref 0–37)
Albumin: 4.7 g/dL (ref 3.5–5.2)
Alkaline Phosphatase: 73 U/L (ref 39–117)
BUN: 10 mg/dL (ref 6–23)
CO2: 27 mEq/L (ref 19–32)
Calcium: 10.1 mg/dL (ref 8.4–10.5)
Chloride: 102 mEq/L (ref 96–112)
Creatinine, Ser: 0.58 mg/dL (ref 0.40–1.20)
GFR: 103.71 mL/min (ref >60.00)
Glucose, Bld: 106 mg/dL — ABNORMAL HIGH (ref 70–99)
Potassium: 4.4 mEq/L (ref 3.5–5.1)
Sodium: 138 mEq/L (ref 135–145)
Total Bilirubin: 0.5 mg/dL (ref 0.2–1.2)
Total Protein: 7.9 g/dL (ref 6.0–8.3)

## 2022-03-07 LAB — LIPASE: Lipase: 17 U/L (ref 11.0–59.0)

## 2022-03-07 LAB — TSH: TSH: 1.82 u[IU]/mL (ref 0.35–5.50)

## 2022-03-07 MED ORDER — ALPRAZOLAM 0.5 MG PO TABS: 0.2500 mg | ORAL_TABLET | Freq: Every evening | ORAL | 0 refills | Status: DC | PRN

## 2022-03-07 MED ORDER — MECLIZINE HCL 12.5 MG PO TABS: 12.5000 mg | ORAL_TABLET | Freq: Three times a day (TID) | ORAL | 0 refills | Status: DC | PRN

## 2022-03-07 NOTE — Telephone Encounter (Signed)
Evaluated in office

## 2022-03-07 NOTE — Telephone Encounter (Signed)
Patient scheduled to see Romilda Garret NP today 03/07/22 at 12:20 pm.

## 2022-03-07 NOTE — Telephone Encounter (Signed)
After speaking with Krista Garret NP called patient and advised her that he will work her in today at 12:20 pm and she needs to be here 10 minutes early to get checked in. Patient stated that she lives close by and will be here early.

## 2022-03-07 NOTE — Telephone Encounter (Signed)
Patient evaluated during office visit

## 2022-03-07 NOTE — Progress Notes (Signed)
Acute Office Visit  Subjective:     Patient ID: Krista Perez, female    DOB: Jun 08, 1970, 52 y.o.   MRN: [id number]  Chief Complaint  Patient presents with   Dizziness    Monday am around 3-4 am and still having dizziness.   Hypertension    Bp 155/99   Emesis    Pt stated she had 4 cocktail Sunday night and threw it up Monday.    HPI Patient is in today for multiple complaints with a history of  vertigo, hld, headaches, migraines, palpitations   Dizziness: states that she noticed it Monday at 3am to get to go to the bathroom. States it was a spinning sensation. States that she has a history of vertigo. States that moving her head makes it worse. States that she did go to work and went home. States that she did have an episode of vomiting and threw up alcohol.   States 160/99 and 150/99 last night was 170/99. States that it is an upper arm cuff.  States that she has tried tylenol. States that she took half of a xanax Review of Systems  Constitutional:  Negative for chills and fever.  Respiratory:  Negative for shortness of breath.   Cardiovascular:  Negative for chest pain.  Gastrointestinal:  Positive for vomiting. Negative for abdominal pain, blood in stool, constipation, diarrhea and nausea.       Bm this am that was a little loose   Neurological:  Positive for dizziness. Negative for tingling, focal weakness, weakness and headaches.  Psychiatric/Behavioral:  Negative for hallucinations.         Objective:    BP 120/74   Pulse 90   Temp 98 F (36.7 C)   Resp 16   Ht 5' 2"$  (1.575 m)   Wt 142 lb (64.4 kg)   LMP 09/11/2012   SpO2 99%   BMI 25.97 kg/m  BP Readings from Last 3 Encounters:  03/07/22 120/74  12/06/21 111/80  10/05/21 132/74   Wt Readings from Last 3 Encounters:  03/07/22 142 lb (64.4 kg)  12/06/21 143 lb (64.9 kg)  10/05/21 144 lb (65.3 kg)      Physical Exam Vitals and nursing note reviewed.  Constitutional:      Appearance: Normal  appearance.  Cardiovascular:     Rate and Rhythm: Normal rate and regular rhythm.     Heart sounds: Normal heart sounds.  Pulmonary:     Effort: Pulmonary effort is normal.     Breath sounds: Normal breath sounds.  Abdominal:     General: Bowel sounds are normal. There is no distension.     Palpations: There is no mass.     Tenderness: There is abdominal tenderness.     Hernia: No hernia is present.  Musculoskeletal:     Right lower leg: No edema.     Left lower leg: No edema.  Neurological:     General: No focal deficit present.     Mental Status: She is alert.     Cranial Nerves: Cranial nerves 2-12 are intact.     Sensory: Sensation is intact.     Motor: Motor function is intact.     Gait: Gait is intact.     Deep Tendon Reflexes:     Reflex Scores:      Bicep reflexes are 2+ on the right side and 2+ on the left side.      Patellar reflexes are 2+ on the right side and 2+  on the left side.    Comments: Bilateral upper and lower extremity strength 5/5     No results found for any visits on 03/07/22.      Assessment & Plan:   Problem List Items Addressed This Visit       Cardiovascular and Mediastinum   Elevated blood pressure reading with diagnosis of hypertension    Patient is been checking blood pressure at home with elevated readings.  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  Blood pressure slightly above goal at beginning of visit but normotensive at the end of visit.  Patient will check blood pressures once a day for a week week and send to primary care provider      Relevant Orders   TSH     Digestive   Nausea and vomiting    Ambiguous in nature.  Patient states he had 4 cocktails in the evening and vomited Monday day for 1 episode.  Some epigastric tenderness pending labs inclusive of lipase.      Relevant Orders   CBC   Comprehensive metabolic panel   Lipase     Other   HLD (hyperlipidemia)    Dizziness - Primary    Neurological exam benign in office.  History of vertigo seems vertiginous in nature.  Meclizine 12.5 mg 3 times daily as needed.  Patient also encouraged to make sure she drinking plenty of fluid to have a light yellow to clear urine      Relevant Orders   CBC   Comprehensive metabolic panel   TSH   Vitamin B12 deficiency    Meds ordered this encounter  Medications   meclizine (ANTIVERT) 12.5 MG tablet    Sig: Take 1 tablet (12.5 mg total) by mouth 3 (three) times daily as needed for dizziness.    Dispense:  30 tablet    Refill:  0    Order Specific Question:   Supervising Provider    Answer:   TOWER, MARNE A [[id number]    Return if symptoms worsen or fail to improve.  Romilda Garret, NP

## 2022-03-07 NOTE — Addendum Note (Signed)
Addended by: Ria Bush on: 03/07/2022 02:03 PM   Modules accepted: Orders

## 2022-03-07 NOTE — Telephone Encounter (Signed)
ERx 

## 2022-03-07 NOTE — Telephone Encounter (Signed)
[facility] - Client TELEPHONE ADVICE RECORD Access[facility]urse Patient Name: Krista Perez [facility] Gender: Female DOB: 24-Mar-1970 Age: 52 Y 10 M 15 D Return Phone Number: [phone] (Primary) Address: City/ State/ Zip: [place] [address] Client [facility] - Client Client Site [place] Provider Ria Bush - MD Contact Type Call Who Is Calling Patient / Member / Family / Caregiver Call Type Triage / Clinical Relationship To Patient Self Return Phone Number [phone] (Primary) Chief Complaint Dizziness Reason for Call Symptomatic / Request for [place] says she is having dizziness and high BP of 150/99 Translation No Nurse Assessment Nurse: Thad Ranger, RN, Langley Gauss Date/Time (Eastern Time): 03/07/2022 8:50:13 AM Confirm and document reason for call. If symptomatic, describe symptoms. ---Caller says she is having dizziness and high BP of 150/99 Does the patient have any new or worsening symptoms? ---Yes Will a triage be completed? ---Yes Related visit to physician within the last 2 weeks? ---No Does the PT have any chronic conditions? (i.e. diabetes, asthma, this includes High risk factors for pregnancy, etc.) ---No Is the patient pregnant or possibly pregnant? (Ask all females between the ages of 40-55) ---No Is this a behavioral health or substance abuse call? ---No Guidelines Guideline Title Affirmed Question Affirmed Notes Nurse Date/Time (Eastern Time) Dizziness - Lightheadedness [1] MODERATE dizziness (e.g., interferes with normal activities) AND [2] has NOT been evaluated by doctor (or NP/PA) for this (Exception: Dizziness caused by heat exposure, sudden Carmon, RN, Langley Gauss 03/07/2022 8:51:16 AM PLEASE NOTE: All timestamps contained within this report are represented as [place] Standard Time. CONFIDENTIALTY NOTICE: This fax transmission  is intended only for the addressee. It contains information that is legally privileged, confidential or otherwise protected from use or disclosure. If you are not the intended recipient, you are strictly prohibited from reviewing, disclosing, copying using or disseminating any of this information or taking any action in reliance on or regarding this information. If you have received this fax in error, please notify us immediately by telephone so that we can arrange for its return to [place]. Phone: [phone], Toll-Free: [phone], Fax: [phone] Page: 2 of 2 Call Id: [id number] Guidelines Guideline Title Affirmed Question Affirmed Notes Nurse Date/Time Eilene Ghazi Time) standing, or poor fluid intake.) Disp. Time Eilene Ghazi Time) Disposition Final User 03/07/2022 8:54:32 AM See PCP within 24 Hours Yes Thad Ranger, RN, Langley Gauss Final Disposition 03/07/2022 8:54:32 AM See PCP within 24 Hours Yes Carmon, RN, Yevette Edwards Disagree/Comply Comply Caller Understands Yes PreDisposition Call Doctor Care Advice Given Per Guideline SEE PCP WITHIN 24 HOURS: DRINK FLUIDS: * Drink several glasses of fruit juice, other clear fluids or water. LIE DOWN AND REST: CALL BACK IF: * You become worse CARE ADVICE given per Dizziness (Adult) guideline. Referrals REFERRED TO PCP OFFIC

## 2022-03-07 NOTE — Telephone Encounter (Signed)
Appreciate Matt seeing patient.

## 2022-03-07 NOTE — Telephone Encounter (Signed)
Patient called back in after speaking with access nurse and being advised to be seen in the next 24 hours for her elevated b/p and vertigo. She wanted to be squeezed in with Dr Darnell Level today. I advised her that he did not have any appointments available today,and offered her an appointment at another office.She declined and asked could I just ask him to "squeeze"her in today? Dr Darnell Level and Lattie Haw was busy when I went to ask about her being 'squeezed" in. Rollene Fare let me know that she would call to speak with her.

## 2022-03-07 NOTE — Assessment & Plan Note (Signed)
Neurological exam benign in office.  History of vertigo seems vertiginous in nature.  Meclizine 12.5 mg 3 times daily as needed.  Patient also encouraged to make sure Krista Perez drinking plenty of fluid to have a light yellow to clear urine

## 2022-03-07 NOTE — Assessment & Plan Note (Signed)
Ambiguous in nature.  Patient states he had 4 cocktails in the evening and vomited Monday day for 1 episode.  Some epigastric tenderness pending labs inclusive of lipase.

## 2022-03-07 NOTE — Telephone Encounter (Signed)
Patient requesting a refill on alprazolam

## 2022-03-07 NOTE — Telephone Encounter (Signed)
Patient called in and stated that she is experiencing dizziness and her blood pressure has been running high. She stated at the current moment her blood pressure was 150/99. Sent over to access nurse.

## 2022-03-07 NOTE — Patient Instructions (Addendum)
Nice to see you today I have sent in some medication for you to try to help with the dizziness. Make sure you are drinking plenty of fluids. I want your urine to be a light yellow to clear color. Check your blood pressure at home for the next week once a day I will be in touch with the labs once I have the results

## 2022-03-07 NOTE — Assessment & Plan Note (Signed)
Patient is been checking blood pressure at home with elevated readings.  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  Blood pressure slightly above goal at beginning of visit but normotensive at the end of visit.  Patient will check blood pressures once a day for a week week and send to primary care provider

## 2022-03-08 ENCOUNTER — Ambulatory Visit: Payer: Federal, State, Local not specified - PPO | Admitting: Family Medicine

## 2022-03-09 ENCOUNTER — Encounter: Payer: Self-pay | Admitting: Nurse Practitioner

## 2022-03-09 LAB — APOLIPOPROTEIN B: Apolipoprotein B: 161 mg/dL — ABNORMAL HIGH (ref <90)

## 2022-03-10 ENCOUNTER — Telehealth: Payer: Self-pay | Admitting: Family Medicine

## 2022-03-10 ENCOUNTER — Encounter: Payer: Self-pay | Admitting: Family Medicine

## 2022-03-10 ENCOUNTER — Ambulatory Visit: Payer: Federal, State, Local not specified - PPO | Admitting: Family Medicine

## 2022-03-10 ENCOUNTER — Ambulatory Visit
Admission: RE | Admit: 2022-03-10 | Discharge: 2022-03-10 | Disposition: A | Discharge status: Home / Self Care | Payer: Federal, State, Local not specified - PPO | Source: Ambulatory Visit | Attending: Family Medicine | Admitting: Family Medicine

## 2022-03-10 VITALS — BP 134/84 | HR 113 | Temp 97.6°F | Ht 62.0 in | Wt 143.0 lb

## 2022-03-10 DIAGNOSIS — H814 Vertigo of central origin: Secondary | ICD-10-CM | POA: Diagnosis not present

## 2022-03-10 DIAGNOSIS — E782 Mixed hyperlipidemia: Secondary | ICD-10-CM | POA: Diagnosis not present

## 2022-03-10 DIAGNOSIS — E538 Deficiency of other specified B group vitamins: Secondary | ICD-10-CM | POA: Diagnosis not present

## 2022-03-10 DIAGNOSIS — I10 Essential (primary) hypertension: Secondary | ICD-10-CM

## 2022-03-10 DIAGNOSIS — R42 Dizziness and giddiness: Secondary | ICD-10-CM | POA: Diagnosis not present

## 2022-03-10 MED ORDER — ATORVASTATIN CALCIUM 20 MG PO TABS: 20.0000 mg | ORAL_TABLET | Freq: Every day | ORAL | 6 refills | Status: DC

## 2022-03-10 MED ORDER — FLUTICASONE PROPIONATE 50 MCG/ACT NA SUSP: 2.0000 | Freq: Every day | NASAL | 0 refills | Status: DC

## 2022-03-10 NOTE — Telephone Encounter (Signed)
I think it's reasonable to be on a cholesterol medicine because her cholesterol levels were high - I've sent in atorvastatin to start at 96m daily. Watch for muscle aches on this medicine.  However I don't think this is contributing to her vertigo. Sugar was only mildly elevated - was she fasting for these labs?  When is ENT appt next week? Would offer 4:15pm this afternoon for recheck.

## 2022-03-10 NOTE — Assessment & Plan Note (Signed)
Marked deterioration noted - start atorvastatin 37m daily. LDL 201.  The 10-year ASCVD risk score (Arnett DK, et al., 2019) is: 2.9%   Values used to calculate the score:     Age: 89years     Sex: Female     Is Non-Hispanic African American: No     Diabetic: No     Tobacco smoker: No     Systolic Blood Pressure: 1Q000111QmmHg     Is BP treated: No     HDL Cholesterol: 48.1 mg/dL     Total Cholesterol: 284 mg/dL

## 2022-03-10 NOTE — Progress Notes (Signed)
Patient ID: Krista Perez, female    DOB: March 31, 1970, 52 y.o.   MRN: [id number]  This visit was conducted in person.  BP 134/84   Pulse (!) 113   Temp 97.6 F (36.4 C) (Temporal)   Ht 5' 2"$  (1.575 m)   Wt 143 lb (64.9 kg)   LMP 09/11/2012   SpO2 99%   BMI 26.16 kg/m    CC: dizziness Subjective:   HPI: Krista Perez is a 52 y.o. female presenting on 03/10/2022 for Dizziness (C/o ongoing dizziness. Seen recently for same sxs. Pt is too dizzy to do orthostatics. )   See prior note for details.  Saw Romilda Garret NP earlier this week with episodes of spinning dizziness that started after drinking 4 cocktails Sunday evening. Woke up Monday 2am with vertigo. Since then has had ongoing dizziness, varying intensity of vertigo over the last 5 days, never fully resolved except for in her sleep. Currently more imbalanced/unsteady on her feet. Last week had R muffled hearing present - this has since resolved. No other viral URI symptoms. No fevers/chills. Mild tinnitus to R ear noted today.   Elevated BP readings recently - 150/99.   No HA, unilateral numbness/weakness, chest pain, slurred speech, confusion.   No fmhx heart attacks or stroke.   Recent labs showing significantly elevated cholesterol levels (LDL 201, ApoB 161) - planning to start atorvastatin.      Relevant past medical, surgical, family and social history reviewed and updated as indicated. Interim medical history since our last visit reviewed. Allergies and medications reviewed and updated. Outpatient Medications Prior to Visit  Medication Sig Dispense Refill   ALPRAZolam (XANAX) 0.5 MG tablet Take 0.5-1 tablets (0.25-0.5 mg total) by mouth at bedtime as needed. for sleep 30 tablet 0   clobetasol ointment (TEMOVATE) 0.05 % Apply to affected area every night for 4 weeks, then every other day for 4 weeks and then twice a week for 4 weeks or until resolution. 60 g 5   cyclobenzaprine (FLEXERIL) 10 MG tablet TAKE 0.5-1 TABS  2 TIMES DAILY AS NEEDED FOR UPTO 10 DAYS FOR MUSCLE SPASMS (CAUTION: SEDATION) 30 tablet 0   fluticasone (FLONASE) 50 MCG/ACT nasal spray Place 2 sprays into both nostrils daily. 16 g 0   meclizine (ANTIVERT) 12.5 MG tablet Take 1 tablet (12.5 mg total) by mouth 3 (three) times daily as needed for dizziness. 30 tablet 0   atorvastatin (LIPITOR) 20 MG tablet Take 1 tablet (20 mg total) by mouth daily. (Patient not taking: Reported on 03/10/2022) 30 tablet 6   No facility-administered medications prior to visit.     Per HPI unless specifically indicated in ROS section below Review of Systems  Objective:  BP 134/84   Pulse (!) 113   Temp 97.6 F (36.4 C) (Temporal)   Ht 5' 2"$  (1.575 m)   Wt 143 lb (64.9 kg)   LMP 09/11/2012   SpO2 99%   BMI 26.16 kg/m   Wt Readings from Last 3 Encounters:  03/10/22 143 lb (64.9 kg)  03/07/22 142 lb (64.4 kg)  12/06/21 143 lb (64.9 kg)      Physical Exam Vitals and nursing note reviewed.  Constitutional:      Appearance: Normal appearance. She is not ill-appearing.  HENT:     Head: Normocephalic and atraumatic.     Right Ear: Tympanic membrane, ear canal and external ear normal. There is no impacted cerumen.     Left Ear: Tympanic membrane, ear canal and  external ear normal. There is no impacted cerumen.     Mouth/Throat:     Mouth: Mucous membranes are moist.     Pharynx: Oropharynx is clear. No oropharyngeal exudate or posterior oropharyngeal erythema.  Eyes:     Extraocular Movements: Extraocular movements intact.     Conjunctiva/sclera: Conjunctivae normal.     Pupils: Pupils are equal, round, and reactive to light.  Cardiovascular:     Rate and Rhythm: Normal rate and regular rhythm.     Pulses: Normal pulses.     Heart sounds: Normal heart sounds. No murmur heard. Pulmonary:     Effort: Pulmonary effort is normal. No respiratory distress.     Breath sounds: Normal breath sounds. No wheezing, rhonchi or rales.  Musculoskeletal:      Cervical back: Normal range of motion and neck supple. No rigidity.     Right lower leg: No edema.     Left lower leg: No edema.  Skin:    General: Skin is warm and dry.     Findings: No rash.  Neurological:     General: No focal deficit present.     Mental Status: She is alert.     Cranial Nerves: Cranial nerves 2-12 are intact.     Sensory: Sensation is intact.     Motor: Motor function is intact.     Coordination: Coordination is intact.     Gait: Gait is intact.     Comments:  CN 2-12 intact FTN intact EOMI  Neg romberg Dix Hallpike - minimal nystagmus bilaterally   Psychiatric:        Mood and Affect: Mood normal.        Behavior: Behavior normal.       Results for orders placed or performed in visit on 03/07/22  CBC  Result Value Ref Range   WBC 6.1 4.0 - 10.5 K/uL   RBC 5.33 (H) 3.87 - 5.11 Mil/uL   Platelets 291.0 150.0 - 400.0 K/uL   Hemoglobin 14.8 12.0 - 15.0 g/dL   HCT 43.9 36.0 - 46.0 %   MCV 82.3 78.0 - 100.0 fl   MCHC 33.7 30.0 - 36.0 g/dL   RDW 13.9 11.5 - 15.5 %  Comprehensive metabolic panel  Result Value Ref Range   Sodium 138 135 - 145 mEq/L   Potassium 4.4 3.5 - 5.1 mEq/L   Chloride 102 96 - 112 mEq/L   CO2 27 19 - 32 mEq/L   Glucose, Bld 106 (H) 70 - 99 mg/dL   BUN 10 6 - 23 mg/dL   Creatinine, Ser 0.58 0.40 - 1.20 mg/dL   Total Bilirubin 0.5 0.2 - 1.2 mg/dL   Alkaline Phosphatase 73 39 - 117 U/L   AST 17 0 - 37 U/L   ALT 22 0 - 35 U/L   Total Protein 7.9 6.0 - 8.3 g/dL   Albumin 4.7 3.5 - 5.2 g/dL   GFR 103.71 >60.00 mL/min   Calcium 10.1 8.4 - 10.5 mg/dL  TSH  Result Value Ref Range   TSH 1.82 0.35 - 5.50 uIU/mL  Lipase  Result Value Ref Range   Lipase 17.0 11.0 - 59.0 U/L  Vitamin B12  Result Value Ref Range   Vitamin B-12 255 211 - 911 pg/mL  Apolipoprotein B  Result Value Ref Range   Apolipoprotein B 161 (H) <90 mg/dL  Lipid panel  Result Value Ref Range   Cholesterol 284 (H) 0 - 200 mg/dL   Triglycerides 177.0 (H) 0.0 -  149.0 mg/dL   HDL 48.10 >39.00 mg/dL   VLDL 35.4 0.0 - 40.0 mg/dL   LDL Cholesterol 201 (H) 0 - 99 mg/dL   Total CHOL/HDL Ratio 6    NonHDL 235.95     Assessment & Plan:   Problem List Items Addressed This Visit     HLD (hyperlipidemia)    Marked deterioration noted - start atorvastatin 29m daily. LDL 201.  The 10-year ASCVD risk score (Arnett DK, et al., 2019) is: 2.9%   Values used to calculate the score:     Age: [age]years     Sex: Female     Is Non-Hispanic African American: No     Diabetic: No     Tobacco smoker: No     Systolic Blood Pressure: 1Q000111QmmHg     Is BP treated: No     HDL Cholesterol: 48.1 mg/dL     Total Cholesterol: 284 mg/dL       Vertigo - Primary    Describes short lived episodes of vertigo associated with persistent feeling of drunk/unsteadiness however not affecting ambulation.  This feels different than her usual episodes of BPPV given duration of dizziness. Anticipate BPPV interspersed with ongoing multifactorial dizziness - discussed how meclizine could contribute to unsteadiness as well as dehydration, r/o vestibular neuritis.  Cardiovascular risk factors include recent elevated BP readings as well as newly noted hyperlipidemia LDL 201.  She remains very worried. Will check head CT for further evaluation, and start aspirin and statin as per above. Continue to encourage good hydration.       Elevated blood pressure reading with diagnosis of hypertension    Significantly elevated readings over the past week - however more recently and in office today remaining normal. Will continue to monitor.       Vitamin B12 deficiency    B12 low normal - will recommend restarting oral b12 replacement as this could contribute to dizziness.      Other Visit Diagnoses     Vertigo of central origin       Relevant Orders   CT HEAD WO CONTRAST (5MM)   CT HEAD WO CONTRAST (5MM)        No orders of the defined types were placed in this encounter.   Orders  Placed This Encounter  Procedures   CT HEAD WO CONTRAST (5MM)    Standing Status:   Future    Standing Expiration Date:   03/11/2023    Order Specific Question:   Is patient pregnant?    Answer:   No    Order Specific Question:   Preferred imaging location?    Answer:   [facility]   CT HEAD WO CONTRAST (5MM)    Standing Status:   Future    Standing Expiration Date:   03/11/2023    Order Specific Question:   Is patient pregnant?    Answer:   No    Order Specific Question:   Preferred imaging location?    Answer:   [facility]    Patient Instructions  Possible benign positional vertigo vs vestibular neuritis.  Start atorvastatin 228mand aspirin 8189maily.  [facility] Push fluids and rest If worsening symptoms, go to ER.   Follow up plan: Return if symptoms worsen or fail to improve.  JavRia BushD

## 2022-03-10 NOTE — Telephone Encounter (Signed)
Spoke with pt relaying Dr. Synthia Innocent message. Pt verbalizes understanding. States her ENT appt is 03/13/22 at 3:00.  I offered a 4:15 OV today. Pt accepted. Will have her added to the schedule.

## 2022-03-10 NOTE — Patient Instructions (Addendum)
Possible benign positional vertigo vs vestibular neuritis.  Start atorvastatin 28m and aspirin 851mdaily.  [facility] Continue mecilizine. Push fluids and rest If worsening symptoms, go to ER.

## 2022-03-10 NOTE — Assessment & Plan Note (Signed)
B12 low normal - will recommend restarting oral b12 replacement as this could contribute to dizziness.

## 2022-03-10 NOTE — Telephone Encounter (Signed)
Patient was seen on with [facility] for dizziness. She called in today stating that she is still dizzy,and from her results she see that her cholesterol and her glucose level is high ,and she would like to know could that be causing her dizziness? She requesting a phone call to discuss this to see if she may need to be on cholesterol medication? Or if she could be squeezed in to be seen today?

## 2022-03-10 NOTE — Assessment & Plan Note (Addendum)
Significantly elevated readings over the past week - however more recently and in office today remaining normal. Will continue to monitor.

## 2022-03-10 NOTE — Assessment & Plan Note (Addendum)
Describes short lived episodes of vertigo associated with persistent feeling of drunk/unsteadiness however not affecting ambulation.  This feels different than her usual episodes of BPPV given duration of dizziness. Anticipate BPPV interspersed with ongoing multifactorial dizziness - discussed how meclizine could contribute to unsteadiness as well as dehydration, r/o vestibular neuritis.  Cardiovascular risk factors include recent elevated BP readings as well as newly noted hyperlipidemia LDL 201.  She remains very worried. Will check head CT for further evaluation, and start aspirin and statin as per above. Continue to encourage good hydration.

## 2022-03-13 DIAGNOSIS — R42 Dizziness and giddiness: Secondary | ICD-10-CM | POA: Diagnosis not present

## 2022-03-13 DIAGNOSIS — H903 Sensorineural hearing loss, bilateral: Secondary | ICD-10-CM | POA: Diagnosis not present

## 2022-03-15 NOTE — Addendum Note (Signed)
Addended by: Ria Bush on: 03/15/2022 05:06 PM   Modules accepted: Orders

## 2022-03-17 NOTE — Addendum Note (Signed)
Addended by: Ria Bush on: 03/17/2022 09:43 AM   Modules accepted: Orders

## 2022-03-17 NOTE — Addendum Note (Signed)
Addended by: Ria Bush on: 03/17/2022 04:52 PM   Modules accepted: Orders

## 2022-03-20 ENCOUNTER — Encounter: Payer: Self-pay | Admitting: *Deleted

## 2022-04-01 ENCOUNTER — Other Ambulatory Visit: Payer: Self-pay | Admitting: Family Medicine

## 2022-04-05 ENCOUNTER — Other Ambulatory Visit: Payer: Federal, State, Local not specified - PPO

## 2022-04-06 DIAGNOSIS — M7989 Other specified soft tissue disorders: Secondary | ICD-10-CM | POA: Diagnosis not present

## 2022-04-06 DIAGNOSIS — S92255A Nondisplaced fracture of navicular [scaphoid] of left foot, initial encounter for closed fracture: Secondary | ICD-10-CM | POA: Diagnosis not present

## 2022-04-06 DIAGNOSIS — M25472 Effusion, left ankle: Secondary | ICD-10-CM | POA: Diagnosis not present

## 2022-04-17 DIAGNOSIS — M79672 Pain in left foot: Secondary | ICD-10-CM | POA: Diagnosis not present

## 2022-05-17 DIAGNOSIS — M79672 Pain in left foot: Secondary | ICD-10-CM | POA: Diagnosis not present

## 2022-06-04 ENCOUNTER — Other Ambulatory Visit: Payer: Self-pay | Admitting: Family Medicine

## 2022-06-04 DIAGNOSIS — E782 Mixed hyperlipidemia: Secondary | ICD-10-CM

## 2022-06-04 DIAGNOSIS — E538 Deficiency of other specified B group vitamins: Secondary | ICD-10-CM

## 2022-06-05 ENCOUNTER — Other Ambulatory Visit (INDEPENDENT_AMBULATORY_CARE_PROVIDER_SITE_OTHER): Payer: Federal, State, Local not specified - PPO

## 2022-06-05 DIAGNOSIS — E782 Mixed hyperlipidemia: Secondary | ICD-10-CM | POA: Diagnosis not present

## 2022-06-05 DIAGNOSIS — E538 Deficiency of other specified B group vitamins: Secondary | ICD-10-CM | POA: Diagnosis not present

## 2022-06-05 LAB — LIPID PANEL
Cholesterol: 196 mg/dL (ref 0–200)
HDL: 41.9 mg/dL (ref >39.00)
LDL Cholesterol: 126 mg/dL — ABNORMAL HIGH (ref 0–99)
NonHDL: 153.94
Total CHOL/HDL Ratio: 5
Triglycerides: 140 mg/dL (ref 0.0–149.0)
VLDL: 28 mg/dL (ref 0.0–40.0)

## 2022-06-05 LAB — VITAMIN B12: Vitamin B-12: 224 pg/mL (ref 211–911)

## 2022-06-07 ENCOUNTER — Ambulatory Visit: Payer: Federal, State, Local not specified - PPO | Admitting: Dermatology

## 2022-06-07 ENCOUNTER — Other Ambulatory Visit: Payer: Self-pay | Admitting: Family Medicine

## 2022-06-07 ENCOUNTER — Encounter: Payer: Self-pay | Admitting: Dermatology

## 2022-06-07 VITALS — BP 112/75

## 2022-06-07 DIAGNOSIS — L810 Postinflammatory hyperpigmentation: Secondary | ICD-10-CM

## 2022-06-07 DIAGNOSIS — B079 Viral wart, unspecified: Secondary | ICD-10-CM | POA: Diagnosis not present

## 2022-06-07 DIAGNOSIS — L820 Inflamed seborrheic keratosis: Secondary | ICD-10-CM | POA: Diagnosis not present

## 2022-06-07 MED ORDER — VITAMIN B-12 1000 MCG PO TABS
1000.0000 ug | ORAL_TABLET | Freq: Every day | ORAL | Status: DC
Start: 2022-06-07 — End: 2022-10-10

## 2022-06-07 MED ORDER — HYDROQUINONE 4 % EX CREA: TOPICAL_CREAM | Freq: Every evening | CUTANEOUS | 2 refills | Status: AC

## 2022-06-07 NOTE — Progress Notes (Signed)
   New Patient Visit   Subjective  Krista Perez is a 51 y.o. female who presents for the following: lesion on the left upper lat eyelid x 6-8 months which maybe getting bigger. A similar lesion on the right forehead x 6-8 months and may be a bit bigger and darker. A small bump on the lower left eyelid x 3 years which she says is bigger. She has no personal or family history of skin cancer. She also has patches of discoloration on the lower legs x 10 years. She says sometimes it flares up and itches. Not currently itchy.  She has multi skin tags on the neck and underarms she wants to talk about having removed.   The following portions of the chart were reviewed this encounter and updated as appropriate: medications, allergies, medical history  Review of Systems:  No other skin or systemic complaints except as noted in HPI or Assessment and Plan.  Objective  Well appearing patient in no apparent distress; mood and affect are within normal limits.    A focused examination was performed of the following areas: Face, eyelids, neck, underarms  Relevant exam findings are noted in the Assessment and Plan.  Left Thigh - Anterior, left upper eyelid, right upper eyelid (6) Skin colored (skin tag like) pedunculated papules with verrucous finger like projections on the tips.    Left Upper Eyelid Keratotic papule    Assessment & Plan   POST-INFLAMMATORY HYPERPIGMENTATION (PIH) Exam: hyperpigmented macules and/or patches at face   This is a benign condition that comes from having previous inflammation in the skin and will fade with time over months to sometimes years. Recommend daily sun protection including sunscreen SPF 30+ to sun-exposed areas. - Recommend treating any itchy or red areas on the skin quickly to prevent new areas of PIH. Treating with prescription medicines such as hydroquinone may help fade dark spots faster.    Treatment Plan:  Hydroquinone cream nightly x 3 months       Viral warts, unspecified type (6) Left Thigh - Anterior, left upper eyelid, right upper eyelid  Destruction of lesion - Left Thigh - Anterior, left upper eyelid, right upper eyelid Complexity: simple   Destruction method: cryotherapy   Informed consent: discussed and consent obtained   Timeout:  patient name, date of birth, surgical site, and procedure verified Lesion destroyed using liquid nitrogen: Yes   Region frozen until ice ball extended beyond lesion: Yes   Outcome: patient tolerated procedure well with no complications   Post-procedure details: wound care instructions given    Seborrheic keratosis, inflamed Left Upper Eyelid  Destruction of lesion - Left Upper Eyelid Complexity: simple   Destruction method: cryotherapy   Informed consent: discussed and consent obtained   Timeout:  patient name, date of birth, surgical site, and procedure verified Lesion destroyed using liquid nitrogen: Yes   Region frozen until ice ball extended beyond lesion: Yes   Outcome: patient tolerated procedure well with no complications   Post-procedure details: wound care instructions given      Return in about 3 months (around 09/07/2022) for PIH.  Jaclynn Guarneri, CMA, am acting as scribe for [employer], DO.   Documentation: I have reviewed the above documentation for accuracy and completeness, and I agree with the above.  Langston Reusing, DO

## 2022-06-07 NOTE — Patient Instructions (Addendum)
Due to recent changes in healthcare laws, you may see results of your pathology and/or laboratory studies on MyChart before the doctors have had a chance to review them. We understand that in some cases there may be results that are confusing or concerning to you. Please understand that not all results are received at the same time and often the doctors may need to interpret multiple results in order to provide you with the best plan of care or course of treatment. Therefore, we ask that you please give [place] 2 business days to thoroughly review all your results before contacting the office for clarification. Should we see a critical lab result, you will be contacted sooner.   If You Need Anything After Your Visit  If you have any questions or concerns for your doctor, please call our main line at [phone] If no one answers, please leave a voicemail as directed and we will return your call as soon as possible. Messages left after 4 pm will be answered the following business day.   You may also send [place] a message via [place]. We typically respond to MyChart messages within 1-2 business days.  For prescription refills, please ask your pharmacy to contact our office. Our fax number is [phone].  If you have an urgent issue when the clinic is closed that cannot wait until the next business day, you can page your doctor at the number below.    Please note that while we do our best to be available for urgent issues outside of office hours, we are not available 24/7.   If you have an urgent issue and are unable to reach [place], you may choose to seek medical care at your doctor's office, retail clinic, urgent care center, or emergency room.  If you have a medical emergency, please immediately call 911 or go to the emergency department. In the event of inclement weather, please call our main line at [phone] for an update on the status of any delays or closures.  Dermatology Medication Tips: Please  keep the boxes that topical medications come in in order to help keep track of the instructions about where and how to use these. Pharmacies typically print the medication instructions only on the boxes and not directly on the medication tubes.   If your medication is too expensive, please contact our office at [phone] or send [place] a message through [place].   We are unable to tell what your co-pay for medications will be in advance as this is different depending on your insurance coverage. However, we may be able to find a substitute medication at lower cost or fill out paperwork to get insurance to cover a needed medication.   If a prior authorization is required to get your medication covered by your insurance company, please allow [place] 1-2 business days to complete this process.  Drug prices often vary depending on where the prescription is filled and some pharmacies may offer cheaper prices.  The website www.goodrx.com contains coupons for medications through different pharmacies. The prices here do not account for what the cost may be with help from insurance (it may be cheaper with your insurance), but the website can give you the price if you did not use any insurance.  - You can print the associated coupon and take it with your prescription to the pharmacy.  - You may also stop by our office during regular business hours and pick up a GoodRx coupon card.  - If you need your  prescription sent electronically to a different pharmacy, notify our office through [facility] or by phone at [phone] Aftercare  Wash gently with soap and water everyday.   Apply Vaseline and Band-Aid daily until healed.

## 2022-06-13 ENCOUNTER — Other Ambulatory Visit: Payer: Self-pay | Admitting: Obstetrics & Gynecology

## 2022-06-13 DIAGNOSIS — Z1231 Encounter for screening mammogram for malignant neoplasm of breast: Secondary | ICD-10-CM

## 2022-06-15 ENCOUNTER — Other Ambulatory Visit: Payer: Self-pay | Admitting: Family Medicine

## 2022-06-15 DIAGNOSIS — F4322 Adjustment disorder with anxiety: Secondary | ICD-10-CM

## 2022-06-15 NOTE — Telephone Encounter (Signed)
Name of Medication: Alprazolam Name of Pharmacy: [facility] Last Fill or Written Date and Quantity: 03/07/22, #30 Last Office Visit and Type: 03/10/22, vertigo Next Office Visit and Type: 10/10/22, CPE Last Controlled Substance Agreement Date: 02/27/17 Last UDS: 02/27/17

## 2022-06-15 NOTE — Telephone Encounter (Signed)
Duplicate request

## 2022-06-20 NOTE — Telephone Encounter (Signed)
ERx 

## 2022-07-04 ENCOUNTER — Ambulatory Visit: Payer: Federal, State, Local not specified - PPO

## 2022-07-07 ENCOUNTER — Ambulatory Visit
Admission: RE | Admit: 2022-07-07 | Discharge: 2022-07-07 | Disposition: A | Discharge status: Home / Self Care | Payer: Federal, State, Local not specified - PPO | Source: Ambulatory Visit | Attending: Obstetrics & Gynecology | Admitting: Obstetrics & Gynecology

## 2022-07-07 DIAGNOSIS — Z1231 Encounter for screening mammogram for malignant neoplasm of breast: Secondary | ICD-10-CM

## 2022-09-07 ENCOUNTER — Other Ambulatory Visit: Payer: Self-pay | Admitting: Family Medicine

## 2022-09-07 ENCOUNTER — Ambulatory Visit: Payer: Federal, State, Local not specified - PPO | Admitting: Dermatology

## 2022-09-07 ENCOUNTER — Encounter (INDEPENDENT_AMBULATORY_CARE_PROVIDER_SITE_OTHER): Payer: Self-pay

## 2022-09-07 DIAGNOSIS — F4322 Adjustment disorder with anxiety: Secondary | ICD-10-CM

## 2022-09-08 NOTE — Telephone Encounter (Signed)
Name of Medication:  Alprazolam Name of Pharmacy:  [facility] Last Fill or Written Date and Quantity:  06/20/22, #30 Last Office Visit and Type:  03/10/22, vertigo Next Office Visit and Type:  10/10/22, CPE Last Controlled Substance Agreement Date:  02/27/17 Last UDS:  02/27/17

## 2022-09-10 ENCOUNTER — Encounter: Payer: Self-pay | Admitting: Dermatology

## 2022-09-11 MED ORDER — ALPRAZOLAM 0.5 MG PO TABS
0.2500 mg | ORAL_TABLET | Freq: Every evening | ORAL | 0 refills | Status: DC | PRN
Start: 2022-09-11 — End: 2022-12-01

## 2022-09-11 NOTE — Telephone Encounter (Signed)
ERx 

## 2022-09-11 NOTE — Telephone Encounter (Signed)
Hi Jetta,  Please let patient know we don't prescribe topicals for skin tags.  They are removed cosmetically or patients use OTC topicals at their own risk.  I do not recommend topical OTC liquids bc they more often then not lead to irritation and sometimes infections.  Thanks!

## 2022-10-01 ENCOUNTER — Other Ambulatory Visit: Payer: Self-pay | Admitting: Family Medicine

## 2022-10-01 DIAGNOSIS — E538 Deficiency of other specified B group vitamins: Secondary | ICD-10-CM

## 2022-10-01 DIAGNOSIS — E782 Mixed hyperlipidemia: Secondary | ICD-10-CM

## 2022-10-01 DIAGNOSIS — R7303 Prediabetes: Secondary | ICD-10-CM | POA: Insufficient documentation

## 2022-10-02 ENCOUNTER — Other Ambulatory Visit: Payer: Federal, State, Local not specified - PPO

## 2022-10-04 ENCOUNTER — Other Ambulatory Visit: Payer: Federal, State, Local not specified - PPO

## 2022-10-05 ENCOUNTER — Other Ambulatory Visit: Payer: Federal, State, Local not specified - PPO

## 2022-10-05 ENCOUNTER — Other Ambulatory Visit (INDEPENDENT_AMBULATORY_CARE_PROVIDER_SITE_OTHER): Payer: Federal, State, Local not specified - PPO

## 2022-10-05 DIAGNOSIS — E538 Deficiency of other specified B group vitamins: Secondary | ICD-10-CM | POA: Diagnosis not present

## 2022-10-05 DIAGNOSIS — E782 Mixed hyperlipidemia: Secondary | ICD-10-CM

## 2022-10-05 DIAGNOSIS — R7303 Prediabetes: Secondary | ICD-10-CM

## 2022-10-05 HISTORY — PX: WISDOM TOOTH EXTRACTION: SHX21

## 2022-10-05 LAB — LIPID PANEL
Cholesterol: 236 mg/dL — ABNORMAL HIGH (ref 0–200)
HDL: 51.3 mg/dL (ref >39.00)
LDL Cholesterol: 143 mg/dL — ABNORMAL HIGH (ref 0–99)
NonHDL: 184.84
Total CHOL/HDL Ratio: 5
Triglycerides: 210 mg/dL — ABNORMAL HIGH (ref 0.0–149.0)
VLDL: 42 mg/dL — ABNORMAL HIGH (ref 0.0–40.0)

## 2022-10-05 LAB — COMPREHENSIVE METABOLIC PANEL
ALT: 21 U/L (ref 0–35)
AST: 20 U/L (ref 0–37)
Albumin: 4.4 g/dL (ref 3.5–5.2)
Alkaline Phosphatase: 81 U/L (ref 39–117)
BUN: 10 mg/dL (ref 6–23)
CO2: 27 meq/L (ref 19–32)
Calcium: 9.8 mg/dL (ref 8.4–10.5)
Chloride: 103 meq/L (ref 96–112)
Creatinine, Ser: 0.65 mg/dL (ref 0.40–1.20)
GFR: 100.49 mL/min (ref >60.00)
Glucose, Bld: 95 mg/dL (ref 70–99)
Potassium: 4.7 meq/L (ref 3.5–5.1)
Sodium: 140 meq/L (ref 135–145)
Total Bilirubin: 0.6 mg/dL (ref 0.2–1.2)
Total Protein: 7.7 g/dL (ref 6.0–8.3)

## 2022-10-05 LAB — HEMOGLOBIN A1C: Hgb A1c MFr Bld: 6.2 % (ref 4.6–6.5)

## 2022-10-05 LAB — VITAMIN B12: Vitamin B-12: 283 pg/mL (ref 211–911)

## 2022-10-06 ENCOUNTER — Other Ambulatory Visit: Payer: Federal, State, Local not specified - PPO

## 2022-10-08 LAB — LIPOPROTEIN A (LPA): Lipoprotein (a): 26 nmol/L (ref <75)

## 2022-10-10 ENCOUNTER — Encounter: Payer: Self-pay | Admitting: Family Medicine

## 2022-10-10 ENCOUNTER — Ambulatory Visit (INDEPENDENT_AMBULATORY_CARE_PROVIDER_SITE_OTHER): Payer: Federal, State, Local not specified - PPO | Admitting: Family Medicine

## 2022-10-10 VITALS — BP 124/62 | HR 81 | Temp 97.6°F | Ht 63.0 in | Wt 141.0 lb

## 2022-10-10 DIAGNOSIS — Z780 Asymptomatic menopausal state: Secondary | ICD-10-CM | POA: Diagnosis not present

## 2022-10-10 DIAGNOSIS — E538 Deficiency of other specified B group vitamins: Secondary | ICD-10-CM | POA: Diagnosis not present

## 2022-10-10 DIAGNOSIS — E782 Mixed hyperlipidemia: Secondary | ICD-10-CM

## 2022-10-10 DIAGNOSIS — Z0000 Encounter for general adult medical examination without abnormal findings: Secondary | ICD-10-CM | POA: Diagnosis not present

## 2022-10-10 DIAGNOSIS — R7303 Prediabetes: Secondary | ICD-10-CM

## 2022-10-10 MED ORDER — VITAMIN B-12 1000 MCG PO TABS: 1000.0000 ug | ORAL_TABLET | ORAL | Status: DC

## 2022-10-10 MED ORDER — ATORVASTATIN CALCIUM 20 MG PO TABS: 20.0000 mg | ORAL_TABLET | ORAL | 4 refills | Status: DC

## 2022-10-10 NOTE — Progress Notes (Signed)
Ph: [phone] Fax: [phone]   Patient ID: Krista Perez, female    DOB: 1970-07-15, 52 y.o.   MRN: [id number]  This visit was conducted in person.  BP 124/62   Pulse 81   Temp 97.6 F (36.4 C) (Temporal)   Ht 5\' 3"  (1.6 m)   Wt 141 lb (64 kg)   LMP 09/11/2012   SpO2 97%   BMI 24.98 kg/m    CC: CPE Subjective:   HPI: Krista Perez is a 52 y.o. female presenting on 10/10/2022 for Annual Exam (Labs printed has not been taking meds like recommended. )   Occ LLQ abd pain. No pain over the past few days.   Preventative: Colon cancer screening - Cologuard normal 11/2021 Well woman with GYN Dr Macon Large - ASCUS with negative HR HPV on 12/08/2013. Pap 11/2014 WNL. ASCUS with negative HR HPV on 01/2017 and 01/2018, 11/2021. Trial estrace cream [facility] LMP ~2016 - menopausal.  Lung cancer screening - not eligible  Flu shot - declines due to worsening migraines COVD vaccine Pfizer 02/2019, 03/2019, no booster Td 2011. Tdap - declines Completed hep B. Shingrix - discussed, declines  Seat belt use discussed  Sunscreen use discussed. No changing moles. Saw dermatologist Sleep - averaging 7 hours/night  Non smoker Alcohol - rare  Dentist - q6 mo  Eye exam yearly   Caffeine: rare decaf   Lives with husband and daughter (2009) and 1 dog   Occupation: Orthoptist, housewife. Prior lived in [place] Activity: swimming, gym  Diet: cooks at home. good water, fruits/vegetables daily      Relevant past medical, surgical, family and social history reviewed and updated as indicated. Interim medical history since our last visit reviewed. Allergies and medications reviewed and updated. Outpatient Medications Prior to Visit  Medication Sig Dispense Refill   ALPRAZolam (XANAX) 0.5 MG tablet Take 0.5-1 tablets (0.25-0.5 mg total) by mouth at bedtime as needed. for sleep 30 tablet 0   amoxicillin (AMOXIL) 500 MG capsule Take 500 mg by mouth  every 8 (eight) hours.     clobetasol ointment (TEMOVATE) 0.05 % Apply to affected area every night for 4 weeks, then every other day for 4 weeks and then twice a week for 4 weeks or until resolution. 60 g 5   cyclobenzaprine (FLEXERIL) 10 MG tablet TAKE 0.5-1 TABS 2 TIMES DAILY AS NEEDED FOR UPTO 10 DAYS FOR MUSCLE SPASMS (CAUTION: SEDATION) 30 tablet 0   atorvastatin (LIPITOR) 20 MG tablet Take 1 tablet (20 mg total) by mouth daily. 30 tablet 6   cyanocobalamin (VITAMIN B12) 1000 MCG tablet Take 1 tablet (1,000 mcg total) by mouth daily.     hydroquinone 4 % cream Apply topically at bedtime. Apply to darker areas only every night for 3 months (Patient not taking: Reported on 10/10/2022) 28.35 g 2   fluticasone (FLONASE) 50 MCG/ACT nasal spray Place 2 sprays into both nostrils daily. 16 g 0   meclizine (ANTIVERT) 12.5 MG tablet Take 1 tablet (12.5 mg total) by mouth 3 (three) times daily as needed for dizziness. 30 tablet 0   No facility-administered medications prior to visit.     Per HPI unless specifically indicated in ROS section below Review of Systems  Constitutional:  Negative for activity change, appetite change, chills, fatigue, fever and unexpected weight change.  HENT:  Negative for hearing loss.   Eyes:  Negative for visual disturbance.  Respiratory:  Negative for cough, chest tightness, shortness  of breath and wheezing.   Cardiovascular:  Negative for chest pain, palpitations and leg swelling.  Gastrointestinal:  Negative for abdominal distention, abdominal pain, blood in stool, constipation, diarrhea, nausea and vomiting.  Genitourinary:  Negative for difficulty urinating and hematuria.  Musculoskeletal:  Negative for arthralgias, myalgias and neck pain.  Skin:  Negative for rash.  Neurological:  Negative for dizziness, seizures, syncope and headaches.  Hematological:  Negative for adenopathy. Does not bruise/bleed easily.  Psychiatric/Behavioral:  Negative for dysphoric mood.  The patient is not nervous/anxious.     Objective:  BP 124/62   Pulse 81   Temp 97.6 F (36.4 C) (Temporal)   Ht 5\' 3"  (1.6 m)   Wt 141 lb (64 kg)   LMP 09/11/2012   SpO2 97%   BMI 24.98 kg/m   Wt Readings from Last 3 Encounters:  10/10/22 141 lb (64 kg)  03/10/22 143 lb (64.9 kg)  03/07/22 142 lb (64.4 kg)      Physical Exam Vitals and nursing note reviewed.  Constitutional:      Appearance: Normal appearance. She is not ill-appearing.  HENT:     Head: Normocephalic and atraumatic.     Right Ear: Tympanic membrane, ear canal and external ear normal. There is no impacted cerumen.     Left Ear: Tympanic membrane, ear canal and external ear normal. There is no impacted cerumen.     Mouth/Throat:     Mouth: Mucous membranes are moist.     Pharynx: Oropharynx is clear. No oropharyngeal exudate or posterior oropharyngeal erythema.  Eyes:     General:        Right eye: No discharge.        Left eye: No discharge.     Extraocular Movements: Extraocular movements intact.     Conjunctiva/sclera: Conjunctivae normal.     Pupils: Pupils are equal, round, and reactive to light.  Neck:     Thyroid: No thyroid mass or thyromegaly.  Cardiovascular:     Rate and Rhythm: Normal rate and regular rhythm.     Pulses: Normal pulses.     Heart sounds: Normal heart sounds. No murmur heard. Pulmonary:     Effort: Pulmonary effort is normal. No respiratory distress.     Breath sounds: Normal breath sounds. No wheezing, rhonchi or rales.  Abdominal:     General: Bowel sounds are normal. There is no distension.     Palpations: Abdomen is soft. There is no mass.     Tenderness: There is no abdominal tenderness. There is no guarding or rebound.     Hernia: No hernia is present.  Musculoskeletal:     Cervical back: Normal range of motion and neck supple. No rigidity.     Right lower leg: No edema.     Left lower leg: No edema.  Lymphadenopathy:     Cervical: No cervical adenopathy.   Skin:    General: Skin is warm and dry.     Findings: No rash.  Neurological:     General: No focal deficit present.     Mental Status: She is alert. Mental status is at baseline.  Psychiatric:        Mood and Affect: Mood normal.        Behavior: Behavior normal.       Results for orders placed or performed in visit on 10/05/22  Lipoprotein A (LPA)  Result Value Ref Range   Lipoprotein (a) 26 <75 nmol/L  Hemoglobin A1c  Result Value  Ref Range   Hgb A1c MFr Bld 6.2 4.6 - 6.5 %  Vitamin B12  Result Value Ref Range   Vitamin B-12 283 211 - 911 pg/mL  Comprehensive metabolic panel  Result Value Ref Range   Sodium 140 135 - 145 mEq/L   Potassium 4.7 3.5 - 5.1 mEq/L   Chloride 103 96 - 112 mEq/L   CO2 27 19 - 32 mEq/L   Glucose, Bld 95 70 - 99 mg/dL   BUN 10 6 - 23 mg/dL   Creatinine, Ser 8.11 0.40 - 1.20 mg/dL   Total Bilirubin 0.6 0.2 - 1.2 mg/dL   Alkaline Phosphatase 81 39 - 117 U/L   AST 20 0 - 37 U/L   ALT 21 0 - 35 U/L   Total Protein 7.7 6.0 - 8.3 g/dL   Albumin 4.4 3.5 - 5.2 g/dL   GFR 914.78 >29.56 mL/min   Calcium 9.8 8.4 - 10.5 mg/dL  Lipid panel  Result Value Ref Range   Cholesterol 236 (H) 0 - 200 mg/dL   Triglycerides 213.0 (H) 0.0 - 149.0 mg/dL   HDL 86.57 >84.69 mg/dL   VLDL 62.9 (H) 0.0 - 52.8 mg/dL   LDL Cholesterol 413 (H) 0 - 99 mg/dL   Total CHOL/HDL Ratio 5    NonHDL 184.84     Assessment & Plan:   Problem List Items Addressed This Visit     Health maintenance examination - Primary (Chronic)    Preventative protocols reviewed and updated unless pt declined. Discussed healthy diet and lifestyle.       HLD (hyperlipidemia)    Chronic, has been off statin. Will restart every other day dosing. The 10-year ASCVD risk score (Arnett DK, et al., 2019) is: 2%   Values used to calculate the score:     Age: 73 years     Sex: Female     Is Non-Hispanic African American: No     Diabetic: No     Tobacco smoker: No     Systolic Blood Pressure:  124 mmHg     Is BP treated: No     HDL Cholesterol: 51.3 mg/dL     Total Cholesterol: 236 mg/dL       Relevant Medications   atorvastatin (LIPITOR) 20 MG tablet   Other Relevant Orders   Lipid panel   Comprehensive metabolic panel   Postmenopausal   Vitamin B12 deficiency    Has not been regular with B12 replacement - will start MWF dosing      Relevant Orders   Vitamin B12   Prediabetes    Reviewed limiting added sugar/carbs in diet.         Meds ordered this encounter  Medications   atorvastatin (LIPITOR) 20 MG tablet    Sig: Take 1 tablet (20 mg total) by mouth every other day.    Dispense:  45 tablet    Refill:  4   cyanocobalamin (VITAMIN B12) 1000 MCG tablet    Sig: Take 1 tablet (1,000 mcg total) by mouth every Monday, Wednesday, and Friday.    Orders Placed This Encounter  Procedures   Lipid panel    Standing Status:   Future    Standing Expiration Date:   10/10/2023   Comprehensive metabolic panel    Standing Status:   Future    Standing Expiration Date:   10/10/2023   Vitamin B12    Standing Status:   Future    Standing Expiration Date:   10/10/2023  Patient Instructions  Try atorvastatin every other day Start b12 vitamin MWF.   Good to see you today Return as needed or in 1 year for next physical   Follow up plan: Return in about 1 year (around 10/10/2023) for annual exam, prior fasting for blood work.  Eustaquio Boyden, MD

## 2022-10-10 NOTE — Assessment & Plan Note (Signed)
Chronic, has been off statin. Will restart every other day dosing. The 10-year ASCVD risk score (Arnett DK, et al., 2019) is: 2%   Values used to calculate the score:     Age: 52 years     Sex: Female     Is Non-Hispanic African American: No     Diabetic: No     Tobacco smoker: No     Systolic Blood Pressure: 124 mmHg     Is BP treated: No     HDL Cholesterol: 51.3 mg/dL     Total Cholesterol: 236 mg/dL

## 2022-10-10 NOTE — Assessment & Plan Note (Signed)
Preventative protocols reviewed and updated unless pt declined. Discussed healthy diet and lifestyle.  

## 2022-10-10 NOTE — Assessment & Plan Note (Signed)
Reviewed limiting added sugar/carbs in diet

## 2022-10-10 NOTE — Assessment & Plan Note (Signed)
Has not been regular with B12 replacement - will start MWF dosing

## 2022-10-10 NOTE — Patient Instructions (Addendum)
Try atorvastatin every other day Start b12 vitamin MWF.   Good to see you today Return as needed or in 1 year for next physical

## 2022-11-29 ENCOUNTER — Other Ambulatory Visit: Payer: Self-pay | Admitting: Family Medicine

## 2022-11-29 DIAGNOSIS — F4322 Adjustment disorder with anxiety: Secondary | ICD-10-CM

## 2022-12-01 NOTE — Telephone Encounter (Signed)
ERx 

## 2022-12-12 ENCOUNTER — Other Ambulatory Visit (HOSPITAL_COMMUNITY)
Admission: RE | Admit: 2022-12-12 | Discharge: 2022-12-12 | Disposition: A | Discharge status: Home / Self Care | Payer: Federal, State, Local not specified - PPO | Source: Ambulatory Visit | Attending: Obstetrics & Gynecology | Admitting: Obstetrics & Gynecology

## 2022-12-12 ENCOUNTER — Ambulatory Visit: Payer: Federal, State, Local not specified - PPO | Admitting: Obstetrics & Gynecology

## 2022-12-12 ENCOUNTER — Encounter: Payer: Self-pay | Admitting: Obstetrics & Gynecology

## 2022-12-12 VITALS — BP 134/78 | HR 78 | Wt 144.2 lb

## 2022-12-12 DIAGNOSIS — Z01419 Encounter for gynecological examination (general) (routine) without abnormal findings: Secondary | ICD-10-CM

## 2022-12-12 DIAGNOSIS — N898 Other specified noninflammatory disorders of vagina: Secondary | ICD-10-CM

## 2022-12-12 NOTE — Progress Notes (Signed)
GYNECOLOGY ANNUAL PREVENTATIVE CARE ENCOUNTER NOTE  History:     Krista Perez is a 52 y.o. [phone] female here for a routine annual gynecologic exam.  Current complaints: vaginal odor x 3 weeks.   Denies abnormal vaginal bleeding, discharge, pelvic pain, problems with intercourse or other gynecologic concerns.    Gynecologic History Patient's last menstrual period was 09/11/2012. Contraception: post menopausal status Last Pap: 12/06/2021. Result was normal with negative HPV Last Mammogram: 07/07/2022.  Result was normal Last Cologuard: 12/22/2021.  Result was negative.  Obstetric History OB History  Gravida Para Term Preterm AB Living  3 1 0 1 2 1   SAB IAB Ectopic Multiple Live Births  2       1    # Outcome Date GA Lbr Len/2nd Weight Sex Type Anes PTL Lv  3 Preterm 07/06/07 [redacted]w[redacted]d   F CS-LTranv   LIV     Complications: Severe preeclampsia  2 SAB           1 SAB             Past Medical History:  Diagnosis Date   Chest pain    a. 06/2014 - eval in [place] for sharp c/p;  b. 01/2015 ED vist, neg trop.   Complication of anesthesia    woke up with migraine per patient   COVID-19 virus infection 01/12/2020   Dyspnea    Generalized headaches    frequent   History of hepatitis    a. Hep A after contaminated food, cleared   Hx of migraines    Hyperlipidemia    a. 01/2014 TC 215, TG 139, HDL 44, LDL 143.   Palpitations    Vaginal Pap smear, abnormal     Past Surgical History:  Procedure Laterality Date   APPENDECTOMY  1990   AUGMENTATION MAMMAPLASTY Bilateral    explant 2022   BREAST ENHANCEMENT SURGERY     BREAST IMPLANT REMOVAL Bilateral 02/25/2021   Procedure: REMOVAL OF BILATERAL BREAST IMPLANTS AND CAPSULECTOMIES;  Surgeon: Glenna Fellows, MD;  Location: [facility];  Service: Plastics;  Laterality: Bilateral;   TONSILLECTOMY  1980   WISDOM TOOTH EXTRACTION  10/05/2022    Current Outpatient Medications on File Prior to Visit  Medication Sig  Dispense Refill   ALPRAZolam (XANAX) 0.5 MG tablet TAKE 0.5-1 TABLETS (0.25-0.5 MG TOTAL) BY MOUTH AT BEDTIME AS NEEDED. FOR SLEEP 30 tablet 0   atorvastatin (LIPITOR) 20 MG tablet Take 1 tablet (20 mg total) by mouth every other day. 45 tablet 4   amoxicillin (AMOXIL) 500 MG capsule Take 500 mg by mouth every 8 (eight) hours. (Patient not taking: Reported on 12/12/2022)     clobetasol ointment (TEMOVATE) 0.05 % Apply to affected area every night for 4 weeks, then every other day for 4 weeks and then twice a week for 4 weeks or until resolution. (Patient not taking: Reported on 12/12/2022) 60 g 5   cyanocobalamin (VITAMIN B12) 1000 MCG tablet Take 1 tablet (1,000 mcg total) by mouth every Monday, Wednesday, and Friday. (Patient not taking: Reported on 12/12/2022)     cyclobenzaprine (FLEXERIL) 10 MG tablet TAKE 0.5-1 TABS 2 TIMES DAILY AS NEEDED FOR UPTO 10 DAYS FOR MUSCLE SPASMS (CAUTION: SEDATION) (Patient not taking: Reported on 12/12/2022) 30 tablet 0   hydroquinone 4 % cream Apply topically at bedtime. Apply to darker areas only every night for 3 months (Patient not taking: Reported on 10/10/2022) 28.35 g 2   No current facility-administered medications  on file prior to visit.    No Known Allergies  Social History:  reports that she has never smoked. She has never used smokeless tobacco. She reports current alcohol use. She reports that she does not use drugs.  Family History  Problem Relation Age of Onset   Cancer Maternal Grandfather 44       lung (smoker)   Cancer Maternal Grandmother 90       leukemia   Pancreatic cancer Mother 39       Terminal cancer dx in her 63's - still alive, lives in [place].   Other Father        alive and well, 66, lives in [place], [place]   Other Brother        alive and well, lives in [place], [place].   Cancer Paternal Uncle        lung (smoker)   Cancer Paternal Uncle        lung (smoker)   Coronary artery disease Neg Hx    Stroke Neg Hx    Diabetes Neg Hx     Hypertension Neg Hx     The following portions of the patient's history were reviewed and updated as appropriate: allergies, current medications, past family history, past medical history, past social history, past surgical history and problem list.  Review of Systems Pertinent items noted in HPI and remainder of comprehensive ROS otherwise negative.  Physical Exam:  BP 134/78   Pulse 78   Wt 144 lb 3.2 oz (65.4 kg)   LMP 09/11/2012   BMI 25.54 kg/m  CONSTITUTIONAL: Well-developed, well-nourished female in no acute distress.  HENT:  Normocephalic, atraumatic, External right and left ear normal.  EYES: Conjunctivae and EOM are normal. Pupils are equal, round, and reactive to light. No scleral icterus.  NECK: Normal range of motion, supple, no masses.  Normal thyroid.  SKIN: Skin is warm and dry. No rash noted. Not diaphoretic. No erythema. No pallor. MUSCULOSKELETAL: Normal range of motion. No tenderness.  No cyanosis, clubbing, or edema. NEUROLOGIC: Alert and oriented to person, place, and time. Normal reflexes, muscle tone coordination.  PSYCHIATRIC: Normal mood and affect. Normal behavior. Normal judgment and thought content. CARDIOVASCULAR: Normal heart rate noted, regular rhythm RESPIRATORY: Clear to auscultation bilaterally. Effort and breath sounds normal, no problems with respiration noted. BREASTS: Symmetric in size. No masses, tenderness, skin changes, nipple drainage, or lymphadenopathy bilaterally. Performed in the presence of a chaperone. ABDOMEN: Soft, no distention noted.  No tenderness, rebound or guarding.  PELVIC: Normal appearing external genitalia and urethral meatus; normal appearing vaginal mucosa and cervix.  No abnormal vaginal discharge noted.  Pap smear obtained.  Normal uterine size, no other palpable masses, no uterine or adnexal tenderness.  Performed in the presence of a chaperone.   Assessment and Plan:     1. Vaginal odor Proper vulvar hygiene  emphasized: discussed avoidance of perfumed soaps, detergents, lotions and any type of douches; in addition to wearing cotton underwear and no underwear at night.  Also recommended cleaning front to back, voiding and cleaning up after intercourse.  - Cervicovaginal ancillary only done, will follow up results and manage accordingly.  2. Well woman exam with routine gynecological exam - Cytology - PAP Will follow up results of pap smear and manage accordingly. Normal breast examination today, she was advised to perform periodic self breast examinations.  Mammogram and colon cancer screening are up to date. Routine preventative health maintenance measures emphasized. Please refer to After Visit Summary  for other counseling recommendations.      Jaynie Collins, MD, FACOG Obstetrician & Gynecologist, [facility] for [employer], [facility]

## 2022-12-14 LAB — CERVICOVAGINAL ANCILLARY ONLY
Bacterial Vaginitis (gardnerella): NEGATIVE
Candida Glabrata: NEGATIVE
Candida Vaginitis: NEGATIVE
Comment: NEGATIVE
Comment: NEGATIVE
Comment: NEGATIVE
Comment: NEGATIVE
Trichomonas: NEGATIVE

## 2022-12-22 LAB — CYTOLOGY - PAP
Comment: NEGATIVE
Diagnosis: NEGATIVE
High risk HPV: NEGATIVE

## 2023-03-15 ENCOUNTER — Other Ambulatory Visit: Payer: Self-pay | Admitting: Family Medicine

## 2023-03-15 DIAGNOSIS — F4322 Adjustment disorder with anxiety: Secondary | ICD-10-CM

## 2023-03-16 NOTE — Telephone Encounter (Signed)
 ERx

## 2023-03-16 NOTE — Telephone Encounter (Signed)
Name of Medication:  Alprazolam Name of Pharmacy:  [facility] Last Fill or Written Date and Quantity:  12/21/22, #30 Last Office Visit and Type:  10/10/22, CPE Next Office Visit and Type:  10/15/23, CPE Last Controlled Substance Agreement Date:  02/27/17 Last UDS:  02/27/17

## 2023-04-05 ENCOUNTER — Telehealth: Payer: Self-pay | Admitting: Family Medicine

## 2023-04-05 DIAGNOSIS — L989 Disorder of the skin and subcutaneous tissue, unspecified: Secondary | ICD-10-CM

## 2023-04-05 NOTE — Telephone Encounter (Signed)
 Copied from CRM [id number]. Topic: Referral - Request for Referral >> Apr 05, 2023  3:22 PM Gurney Maxin H wrote: Patient also states she has a skin tag near eye that Dermatologist advised to see Ophthalmology as well.

## 2023-04-06 NOTE — Telephone Encounter (Signed)
 Lvm asking pt to call back. Need to know if pt has eye doc or does she need ophthalmology referral.

## 2023-04-09 ENCOUNTER — Other Ambulatory Visit: Payer: Federal, State, Local not specified - PPO

## 2023-04-09 ENCOUNTER — Encounter: Payer: Self-pay | Admitting: *Deleted

## 2023-04-09 ENCOUNTER — Telehealth: Payer: Self-pay | Admitting: Family Medicine

## 2023-04-09 NOTE — Telephone Encounter (Signed)
 Referral faxed - see referral for updates

## 2023-04-09 NOTE — Addendum Note (Signed)
 Addended by: Eustaquio Boyden on: 04/09/2023 12:45 PM   Modules accepted: Orders

## 2023-04-09 NOTE — Telephone Encounter (Signed)
 New optho referral placed.

## 2023-04-09 NOTE — Telephone Encounter (Addendum)
 Spoke with pt asking if she has eye doc. Confirms she does and sees [employer]. State she needs a referral for skin tag on L eye. Her last one has expired.

## 2023-04-09 NOTE — Telephone Encounter (Signed)
 Rx 10/10/22, #45/4 refills to [facility].   Spoke with pt notifying her she should have refills available at the pharmacy. Pt verbalizes understanding and [facility]

## 2023-04-09 NOTE — Telephone Encounter (Signed)
 Prescription Request  04/09/2023  LOV: 10/10/2022  What is the name of the medication or equipment? atorvastatin (LIPITOR) 20 MG tablet   Have you contacted your pharmacy to request a refill? No   Which pharmacy would you like this sent to?  [name], [place] - [address] [place] [address] Phone: [phone] Fax: [phone]    Patient notified that their request is being sent to the clinical staff for review and that they should receive a response within 2 business days.   Please advise at Mobile [phone] (mobile)

## 2023-04-18 ENCOUNTER — Other Ambulatory Visit

## 2023-04-19 ENCOUNTER — Other Ambulatory Visit

## 2023-04-19 ENCOUNTER — Telehealth: Payer: Self-pay | Admitting: Family Medicine

## 2023-04-19 NOTE — Telephone Encounter (Signed)
 Copied from CRM [id number]. Topic: Referral - Status >> Apr 19, 2023  3:32 PM Krista Perez wrote: Reason for CRM: Patient call in regarding referral to [facility] , stated she got a message that they sent the referral and she could call and scheduled but once she called them they stated they did not received the referral. Would like someone to followup regarding this and the status of the referral

## 2023-04-24 ENCOUNTER — Ambulatory Visit: Payer: Federal, State, Local not specified - PPO | Admitting: Dermatology

## 2023-04-24 NOTE — Telephone Encounter (Signed)
 I have refaxed the referral and contacted the patient and made her aware.

## 2023-04-24 NOTE — Telephone Encounter (Signed)
 Noted.

## 2023-05-30 ENCOUNTER — Other Ambulatory Visit: Payer: Self-pay | Admitting: *Deleted

## 2023-05-30 DIAGNOSIS — Z1231 Encounter for screening mammogram for malignant neoplasm of breast: Secondary | ICD-10-CM

## 2023-06-06 ENCOUNTER — Ambulatory Visit: Payer: Self-pay

## 2023-06-06 ENCOUNTER — Other Ambulatory Visit (INDEPENDENT_AMBULATORY_CARE_PROVIDER_SITE_OTHER)

## 2023-06-06 DIAGNOSIS — E538 Deficiency of other specified B group vitamins: Secondary | ICD-10-CM | POA: Diagnosis not present

## 2023-06-06 DIAGNOSIS — M7918 Myalgia, other site: Secondary | ICD-10-CM | POA: Diagnosis not present

## 2023-06-06 DIAGNOSIS — M542 Cervicalgia: Secondary | ICD-10-CM | POA: Diagnosis not present

## 2023-06-06 DIAGNOSIS — M5481 Occipital neuralgia: Secondary | ICD-10-CM | POA: Diagnosis not present

## 2023-06-06 DIAGNOSIS — G43809 Other migraine, not intractable, without status migrainosus: Secondary | ICD-10-CM | POA: Diagnosis not present

## 2023-06-06 DIAGNOSIS — E782 Mixed hyperlipidemia: Secondary | ICD-10-CM | POA: Diagnosis not present

## 2023-06-06 LAB — COMPREHENSIVE METABOLIC PANEL WITH GFR
ALT: 43 U/L — ABNORMAL HIGH (ref 0–35)
AST: 29 U/L (ref 0–37)
Albumin: 4.4 g/dL (ref 3.5–5.2)
Alkaline Phosphatase: 66 U/L (ref 39–117)
BUN: 8 mg/dL (ref 6–23)
CO2: 26 meq/L (ref 19–32)
Calcium: 9.2 mg/dL (ref 8.4–10.5)
Chloride: 103 meq/L (ref 96–112)
Creatinine, Ser: 0.49 mg/dL (ref 0.40–1.20)
GFR: 107.07 mL/min (ref >60.00)
Glucose, Bld: 81 mg/dL (ref 70–99)
Potassium: 3.9 meq/L (ref 3.5–5.1)
Sodium: 139 meq/L (ref 135–145)
Total Bilirubin: 0.5 mg/dL (ref 0.2–1.2)
Total Protein: 7.6 g/dL (ref 6.0–8.3)

## 2023-06-06 LAB — LIPID PANEL
Cholesterol: 168 mg/dL (ref 0–200)
HDL: 38.6 mg/dL — ABNORMAL LOW (ref >39.00)
LDL Cholesterol: 101 mg/dL — ABNORMAL HIGH (ref 0–99)
NonHDL: 129.79
Total CHOL/HDL Ratio: 4
Triglycerides: 143 mg/dL (ref 0.0–149.0)
VLDL: 28.6 mg/dL (ref 0.0–40.0)

## 2023-06-06 LAB — VITAMIN B12: Vitamin B-12: 298 pg/mL (ref 211–911)

## 2023-06-06 NOTE — Telephone Encounter (Addendum)
 I called pt andpt said I would need to call her back in 3 min s. I advised pt to call [phone] when she could talk with  me. Pt walked in to [facility]. Pt said starting 3 days ago she had lightheadedness on and off. Today she feels better; not much lightheadedness. No lightheadedness now. No CP, no SOB, no H/A or vision changes. Pt is seeing neurologist for neck pain. Pt already scheduled for lab today and pt wants lab done now since pt is fasting and pt scheduled appt with Dr Cherlyn Cornet on 06/08/23 at 8:20. Sooner appt was offered but pt could not due to work schedule. UC & ED precautions given and pt voiced understanding., Dr Crissie Dome out of office and I spoke with [facility] CMA.sending note to Dr Cherlyn Cornet and Arlie Lain to Dr Crissie Dome upon his return to office.

## 2023-06-06 NOTE — Telephone Encounter (Signed)
 Noted will see patient as planned

## 2023-06-06 NOTE — Telephone Encounter (Signed)
  Chief Complaint: dizziness Symptoms: when pt turns head her vision blurs for a few seconds and returns to normal  Frequency: Friday last week  Pertinent Negatives: Patient denies weakness or numbness to one side of the body Disposition: [] ED /[] Urgent Care (no appt availability in office) / [x] Appointment(In office/virtual)/ []  [facility]/ [] Home Care/ [] Refused Recommended Disposition /[] [facility]/ []  Follow-up with PCP Additional Notes: pt only wants to See PCP- no openings until June. Scheduled pt for her lab work that [employer] done Copied from [employer] [phone]. Topic: Clinical - Red Word Triage >> Jun 06, 2023  9:30 AM Rosamond Comes wrote: Red Word that prompted transfer to Nurse Triage: Patient calling to schedule appt follow up with lab work, patient wants a complete lab work done before the appt Patient had dizzy spell lasted Sunday to Wednesday morning, feels like her head is out of place, better this morning First available is June 6th. Patient wold like an earlier appt.  Patient would like blood work done today, Reason for Disposition  [1] MILD dizziness (e.g., walking normally) AND [2] has NOT been evaluated by doctor (or NP/PA) for this  (Exception: Dizziness caused by heat exposure, sudden standing, or poor fluid intake.)  Answer Assessment - Initial Assessment Questions 1. DESCRIPTION: "Describe your dizziness."     When getting up look to the side things get out of place 2. LIGHTHEADED: "Do you feel lightheaded?" (e.g., somewhat faint, woozy, weak upon standing)     Feels better  3. VERTIGO: "Do you feel like either you or the room is spinning or tilting?" (i.e. vertigo)     no 4. SEVERITY: "How bad is it?"  "Do you feel like you are going to faint?" "Can you stand and walk?"   - MILD: Feels slightly dizzy, but walking normally.   - MODERATE: Feels unsteady when walking, but not falling; interferes with normal activities (e.g., school, work).   - SEVERE:  Unable to walk without falling, or requires assistance to walk without falling; feels like passing out now.      Mild- off balance 5. ONSET:  "When did the dizziness begin?"     Friday am  6. AGGRAVATING FACTORS: "Does anything make it worse?" (e.g., standing, change in head position)     standing 8. CAUSE: "What do you think is causing the dizziness?"     ? dehydration 9. RECURRENT SYMPTOM: "Have you had dizziness before?" If Yes, ask: "When was the last time?" "What happened that time?"     Has had vertigo 10. OTHER SYMPTOMS: "Do you have any other symptoms?" (e.g., fever, chest pain, vomiting, diarrhea, bleeding)       Anxiety took 1/2 Xanax ,  Protocols used: Dizziness - Lightheadedness-A-AH

## 2023-06-07 ENCOUNTER — Ambulatory Visit: Payer: Self-pay | Admitting: Family Medicine

## 2023-06-08 ENCOUNTER — Other Ambulatory Visit: Payer: Self-pay | Admitting: Neurology

## 2023-06-08 ENCOUNTER — Ambulatory Visit: Admitting: Family Medicine

## 2023-06-08 DIAGNOSIS — M542 Cervicalgia: Secondary | ICD-10-CM

## 2023-06-11 ENCOUNTER — Ambulatory Visit

## 2023-06-14 ENCOUNTER — Ambulatory Visit

## 2023-06-14 ENCOUNTER — Ambulatory Visit
Admission: RE | Admit: 2023-06-14 | Discharge: 2023-06-14 | Disposition: A | Discharge status: Home / Self Care | Source: Ambulatory Visit | Attending: Neurology | Admitting: Neurology

## 2023-06-14 DIAGNOSIS — D23122 Other benign neoplasm of skin of left lower eyelid, including canthus: Secondary | ICD-10-CM | POA: Diagnosis not present

## 2023-06-14 DIAGNOSIS — M5021 Other cervical disc displacement,  high cervical region: Secondary | ICD-10-CM | POA: Diagnosis not present

## 2023-06-14 DIAGNOSIS — M50223 Other cervical disc displacement at C6-C7 level: Secondary | ICD-10-CM | POA: Diagnosis not present

## 2023-06-14 DIAGNOSIS — M4802 Spinal stenosis, cervical region: Secondary | ICD-10-CM | POA: Diagnosis not present

## 2023-06-14 DIAGNOSIS — M542 Cervicalgia: Secondary | ICD-10-CM | POA: Diagnosis not present

## 2023-06-14 DIAGNOSIS — M50221 Other cervical disc displacement at C4-C5 level: Secondary | ICD-10-CM | POA: Diagnosis not present

## 2023-06-14 DIAGNOSIS — D23112 Other benign neoplasm of skin of right lower eyelid, including canthus: Secondary | ICD-10-CM | POA: Diagnosis not present

## 2023-06-20 ENCOUNTER — Encounter: Payer: Self-pay | Admitting: Family Medicine

## 2023-06-20 ENCOUNTER — Ambulatory Visit: Admitting: Family Medicine

## 2023-06-20 VITALS — BP 145/85 | HR 76 | Wt 146.0 lb

## 2023-06-20 DIAGNOSIS — R42 Dizziness and giddiness: Secondary | ICD-10-CM | POA: Diagnosis not present

## 2023-06-20 DIAGNOSIS — I10 Essential (primary) hypertension: Secondary | ICD-10-CM | POA: Diagnosis not present

## 2023-06-20 NOTE — Progress Notes (Signed)
 RGYN here with concerns that her current complaints/sx's may all be hormonal. c/o: night sweats, mood swings, dizziness and migraines which are once a yr very intense causing nausea/vomiting. Pt states dizziness is causing her to miss work. Also worried about elevation in B/P.   Notes seeing Neurologist regarding dizzy spells.

## 2023-06-20 NOTE — Assessment & Plan Note (Signed)
 Elevated today as well. Continue to monitor.

## 2023-06-20 NOTE — Assessment & Plan Note (Signed)
 Certainly could be worsened by menopause and loss of hormones. She, unfortunately, has been almost 10 years since menopause and HRT at this point would certainly increase risk of stroke, MI, clotting, and breast cancer risk. Would return to ENT and neuro for best practices in treating this.

## 2023-06-20 NOTE — Progress Notes (Signed)
   Subjective:    Patient ID: Krista Perez is a 53 y.o. female presenting with Dizziness, Night Sweats (/), Fatigue, and mood swings  on 06/20/2023  HPI: LMP 8 years ago. Reports bad headache 1x/year for the first 5 years. Also had severe dizziness that occurs 1x/year. Has seen neurology and ENT. Has done PT for this. Has seen PCP, went for CT, negative. Has some vertigo. Lasts 3 days and has N/V and dizziness with occasional vertigo. Has a pinched nerve in her neck and recent MRI without result as yet. Concerned that this might be her hormones.  Reports some yesterday and went away at night. She is trying some head maneuvers this has helped some but is not always effective. Has had some hot sweats at night. Notes she mostly feels hot. Feet and hands get cold after N/V. Hot flashes are much less likely after 5 years post menopause.   Review of Systems  Constitutional:  Negative for chills and fever.  Respiratory:  Negative for shortness of breath.   Cardiovascular:  Negative for chest pain.  Gastrointestinal:  Negative for abdominal pain, nausea and vomiting.  Genitourinary:  Negative for dysuria.  Skin:  Negative for rash.      Objective:    BP (!) 145/85   Pulse 76   Wt 146 lb (66.2 kg)   LMP 09/11/2012   BMI 25.86 kg/m  Physical Exam Exam conducted with a chaperone present.  Constitutional:      General: She is not in acute distress.    Appearance: She is well-developed.  HENT:     Head: Normocephalic and atraumatic.  Eyes:     General: No scleral icterus. Cardiovascular:     Rate and Rhythm: Normal rate.  Pulmonary:     Effort: Pulmonary effort is normal.  Abdominal:     Palpations: Abdomen is soft.  Musculoskeletal:     Cervical back: Neck supple.  Skin:    General: Skin is warm and dry.  Neurological:     Mental Status: She is alert and oriented to person, place, and time.         Assessment & Plan:   Problem List Items Addressed This Visit        Unprioritized   Vertigo   Certainly could be worsened by menopause and loss of hormones. She, unfortunately, has been almost 10 years since menopause and HRT at this point would certainly increase risk of stroke, MI, clotting, and breast cancer risk. Would return to ENT and neuro for best practices in treating this.      Elevated blood pressure reading with diagnosis of hypertension - Primary   Elevated today as well. Continue to monitor.        Return if symptoms worsen or fail to improve.  Granville Layer, MD 06/20/2023 11:19 AM

## 2023-06-22 ENCOUNTER — Ambulatory Visit: Attending: Neurology

## 2023-06-22 DIAGNOSIS — M542 Cervicalgia: Secondary | ICD-10-CM | POA: Diagnosis not present

## 2023-06-22 DIAGNOSIS — M5382 Other specified dorsopathies, cervical region: Secondary | ICD-10-CM | POA: Insufficient documentation
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
## 2023-06-26 ENCOUNTER — Ambulatory Visit: Attending: Neurology | Admitting: Physical Therapy

## 2023-06-26 DIAGNOSIS — M542 Cervicalgia: Secondary | ICD-10-CM | POA: Diagnosis not present

## 2023-06-26 DIAGNOSIS — M5382 Other specified dorsopathies, cervical region: Secondary | ICD-10-CM | POA: Diagnosis not present
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
## 2023-06-27 ENCOUNTER — Ambulatory Visit: Admitting: Physical Therapy

## 2023-06-28 DIAGNOSIS — L821 Other seborrheic keratosis: Secondary | ICD-10-CM | POA: Diagnosis not present

## 2023-06-28 DIAGNOSIS — D23122 Other benign neoplasm of skin of left lower eyelid, including canthus: Secondary | ICD-10-CM | POA: Diagnosis not present

## 2023-06-28 DIAGNOSIS — D485 Neoplasm of uncertain behavior of skin: Secondary | ICD-10-CM | POA: Diagnosis not present

## 2023-06-28 DIAGNOSIS — D23112 Other benign neoplasm of skin of right lower eyelid, including canthus: Secondary | ICD-10-CM | POA: Diagnosis not present
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
## 2023-07-10 ENCOUNTER — Ambulatory Visit

## 2023-07-10 ENCOUNTER — Telehealth: Payer: Self-pay

## 2023-07-10 NOTE — Telephone Encounter (Signed)
 Patient no showed appointment today. Attempted to call patient however patient's voicemail box is full and unable to leave message for patient.    Krista Perez  Brain Cahill, PT, DPT Physical Therapist - [facility]  Outpatient Physical Therapy- Main Campus [phone]

## 2023-07-13 ENCOUNTER — Ambulatory Visit
Admission: RE | Admit: 2023-07-13 | Discharge: 2023-07-13 | Disposition: A | Discharge status: Home / Self Care | Source: Ambulatory Visit | Attending: Obstetrics & Gynecology | Admitting: Obstetrics & Gynecology

## 2023-07-13 DIAGNOSIS — Z1231 Encounter for screening mammogram for malignant neoplasm of breast: Secondary | ICD-10-CM | POA: Diagnosis not present

## 2023-07-16 ENCOUNTER — Encounter: Payer: Self-pay | Admitting: Family Medicine

## 2023-07-16 ENCOUNTER — Telehealth: Payer: Self-pay | Admitting: Family Medicine

## 2023-07-16 ENCOUNTER — Ambulatory Visit: Admitting: Family Medicine

## 2023-07-16 VITALS — BP 138/84 | HR 73 | Temp 98.5°F | Ht 63.0 in | Wt 143.5 lb

## 2023-07-16 DIAGNOSIS — M5481 Occipital neuralgia: Secondary | ICD-10-CM | POA: Insufficient documentation

## 2023-07-16 DIAGNOSIS — I10 Essential (primary) hypertension: Secondary | ICD-10-CM

## 2023-07-16 DIAGNOSIS — E538 Deficiency of other specified B group vitamins: Secondary | ICD-10-CM | POA: Diagnosis not present

## 2023-07-16 DIAGNOSIS — E782 Mixed hyperlipidemia: Secondary | ICD-10-CM | POA: Diagnosis not present

## 2023-07-16 DIAGNOSIS — G43809 Other migraine, not intractable, without status migrainosus: Secondary | ICD-10-CM | POA: Diagnosis not present

## 2023-07-16 MED ORDER — MAGNESIUM 400 MG PO TABS: 1.0000 | ORAL_TABLET | Freq: Every day | ORAL | Status: AC

## 2023-07-16 MED ORDER — VITAMIN B-12 1000 MCG PO TABS: 1000.0000 ug | ORAL_TABLET | ORAL | Status: AC

## 2023-07-16 NOTE — Assessment & Plan Note (Signed)
 Update EKG.  Reviewed DASH diet as well as diet and lifestyle choices to help control BPs.  Reassess at f/u visit and discussed possible antihypertensive if persistently elevated.

## 2023-07-16 NOTE — Assessment & Plan Note (Signed)
 Seeing neurology. Recent cervical MRI just read

## 2023-07-16 NOTE — Telephone Encounter (Signed)
 Plz notify EKG returned reassuringly ok.

## 2023-07-16 NOTE — Telephone Encounter (Signed)
Spoke with pt relaying results.  Pt verbalizes understanding.  

## 2023-07-16 NOTE — Assessment & Plan Note (Signed)
Continue b12 replacement MWF

## 2023-07-16 NOTE — Progress Notes (Signed)
 Ph: [phone] Fax: [phone]   Patient ID: Krista Perez, female    DOB: [birth year], 53 y.o.   MRN: [id number]  This visit was conducted in person.  BP 138/84 (Patient Position: Supine)   Pulse 73   Temp 98.5 F (36.9 C) (Oral)   Ht 5' 3 (1.6 m)   Wt 143 lb 8 oz (65.1 kg)   LMP 09/11/2012   SpO2 99%   BMI 25.42 kg/m   Orthostatic Vitals for the past 48 hrs (Last 6 readings):  Patient Position Orthostatic BP BP Pulse  07/16/23 0809 -- -- (!) 148/84 73  07/16/23 0814 -- -- (!) 142/84 --  07/16/23 0815 Supine -- 138/84 --  07/16/23 0818 Standing 130/86 -- --   CC: dizziness Subjective:   HPI: Krista Perez is a 53 y.o. female presenting on 07/16/2023 for Medical Management of Chronic Issues (Here to review recent lab results. Also, c/o dizziness. )   Home BP readings have been running high - 140-150s sbp. She feels mildly lightheaded with high BP readings. No HA, vision changes, CP/tightness, SOB, leg swelling.   Recent labs reviewed with patient.  B12 298 - she has not been taking oral B12 replacement.   Had recurrent episode of vertigo - these episodes last 2-3 days at a time. Describes more as lightheadedness. Saw neurologist.   H/o vertigo last evaluated 02/2022 - saw neurology Dr Maree, diagnosed with vestibular migraines. Started on magnesium 400-600mg  daily. Also diagnosed with occipital neuralgia - to consider occipital nerve block. Planned f/u 3-4 months. She notes symptoms worse when she sleeps on abdomen so now avoiding. Rx flexeril . Previously did not respond well to topamax 25mg , baclofen 10mg .   Cervical spine MRI 05/2023 showed mild noncompressive bulging discs at C3/4 through C6/7 without spinal stenosis, as well as mild L C5 /C6 foraminal narrowing related to uncovertebral and facet disease, along with mild multilevel facet hypertrophy.   Started vestibular rehab PT at [facility] last month - for both neck pain and vestibular therapy.   HLD - taking  atorvastatin  20mg  3x/wk.  The 10-year ASCVD risk score (Arnett DK, et al., 2019) is: 2.5%   Values used to calculate the score:     Age: 20 years     Clincally relevant sex: Female     Is Non-Hispanic African American: No     Diabetic: No     Tobacco smoker: No     Systolic Blood Pressure: 138 mmHg     Is BP treated: No     HDL Cholesterol: 38.6 mg/dL     Total Cholesterol: 168 mg/dL      Relevant past medical, surgical, family and social history reviewed and updated as indicated. Interim medical history since our last visit reviewed. Allergies and medications reviewed and updated. Outpatient Medications Prior to Visit  Medication Sig Dispense Refill   ALPRAZolam  (XANAX ) 0.5 MG tablet TAKE 1/2 TO 1 TABLET BY MOUTH AT BEDTIME AS NEEDED FOR SLEEP 30 tablet 0   cyclobenzaprine  (FLEXERIL ) 10 MG tablet TAKE 0.5-1 TABS 2 TIMES DAILY AS NEEDED FOR UPTO 10 DAYS FOR MUSCLE SPASMS (CAUTION: SEDATION) 30 tablet 0   hydroquinone  4 % cream Apply topically at bedtime. Apply to darker areas only every night for 3 months 28.35 g 2   atorvastatin  (LIPITOR) 20 MG tablet Take 1 tablet (20 mg total) by mouth every other day. 45 tablet 4   cyanocobalamin  (VITAMIN B12) 1000 MCG tablet Take 1 tablet (1,000 mcg total) by mouth every  Monday, Wednesday, and Friday.     atorvastatin  (LIPITOR) 10 MG tablet Take 1 tablet (10 mg total) by mouth every Monday, Wednesday, and Friday.     clobetasol  ointment (TEMOVATE ) 0.05 % Apply to affected area every night for 4 weeks, then every other day for 4 weeks and then twice a week for 4 weeks or until resolution. (Patient not taking: Reported on 06/20/2023) 60 g 5   amoxicillin  (AMOXIL ) 500 MG capsule Take 500 mg by mouth every 8 (eight) hours. (Patient not taking: Reported on 06/20/2023)     No facility-administered medications prior to visit.     Per HPI unless specifically indicated in ROS section below Review of Systems  Objective:  BP 138/84 (Patient Position:  Supine)   Pulse 73   Temp 98.5 F (36.9 C) (Oral)   Ht 5' 3 (1.6 m)   Wt 143 lb 8 oz (65.1 kg)   LMP 09/11/2012   SpO2 99%   BMI 25.42 kg/m   Wt Readings from Last 3 Encounters:  07/16/23 143 lb 8 oz (65.1 kg)  06/20/23 146 lb (66.2 kg)  12/12/22 144 lb 3.2 oz (65.4 kg)      Physical Exam Vitals and nursing note reviewed.  Constitutional:      Appearance: Normal appearance. She is not ill-appearing.  HENT:     Head: Normocephalic and atraumatic.     Mouth/Throat:     Mouth: Mucous membranes are moist.     Pharynx: Oropharynx is clear. No oropharyngeal exudate or posterior oropharyngeal erythema.   Eyes:     Conjunctiva/sclera: Conjunctivae normal.     Pupils: Pupils are equal, round, and reactive to light.   Neck:     Thyroid : No thyroid  mass or thyromegaly.   Cardiovascular:     Rate and Rhythm: Normal rate and regular rhythm.     Pulses: Normal pulses.     Heart sounds: Normal heart sounds. No murmur heard. Pulmonary:     Effort: Pulmonary effort is normal. No respiratory distress.     Breath sounds: Normal breath sounds. No wheezing, rhonchi or rales.   Musculoskeletal:     Cervical back: Normal range of motion and neck supple.     Right lower leg: No edema.     Left lower leg: No edema.   Skin:    General: Skin is warm and dry.     Findings: No rash.   Neurological:     Mental Status: She is alert.   Psychiatric:        Mood and Affect: Mood normal.        Behavior: Behavior normal.       Results for orders placed or performed in visit on 06/06/23  Vitamin B12   Collection Time: 06/06/23 12:47 PM  Result Value Ref Range   Vitamin B-12 298 211 - 911 pg/mL  Comprehensive metabolic panel   Collection Time: 06/06/23 12:47 PM  Result Value Ref Range   Sodium 139 135 - 145 mEq/L   Potassium 3.9 3.5 - 5.1 mEq/L   Chloride 103 96 - 112 mEq/L   CO2 26 19 - 32 mEq/L   Glucose, Bld 81 70 - 99 mg/dL   BUN 8 6 - 23 mg/dL   Creatinine, Ser 9.50 0.40 -  1.20 mg/dL   Total Bilirubin 0.5 0.2 - 1.2 mg/dL   Alkaline Phosphatase 66 39 - 117 U/L   AST 29 0 - 37 U/L   ALT 43 (H) 0 - 35  U/L   Total Protein 7.6 6.0 - 8.3 g/dL   Albumin 4.4 3.5 - 5.2 g/dL   GFR 892.92 >39.99 mL/min   Calcium  9.2 8.4 - 10.5 mg/dL  Lipid panel   Collection Time: 06/06/23 12:47 PM  Result Value Ref Range   Cholesterol 168 0 - 200 mg/dL   Triglycerides 856.9 0.0 - 149.0 mg/dL   HDL 61.39 (L) >60.99 mg/dL   VLDL 71.3 0.0 - 59.9 mg/dL   LDL Cholesterol 898 (H) 0 - 99 mg/dL   Total CHOL/HDL Ratio 4    NonHDL 129.79    EKG - NSR rate 60s, normal axis, intervals, no hypertrophy or acute ST/T changes   Assessment & Plan:   Problem List Items Addressed This Visit     HLD (hyperlipidemia)   Chronic, reviewed statin use leading to improved cholesterol control.  ALT mildly elevated - will monitor this.  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  Discussed with patient. Rec flexeril  PRN during episodes.  Continue PT.  Continue magnesium preventatively      Relevant Medications   atorvastatin  (LIPITOR) 10 MG tablet   Elevated blood pressure reading with diagnosis of hypertension   Update EKG.  Reviewed DASH diet as well as diet and lifestyle choices to help control BPs.  Reassess at f/u visit and discussed possible antihypertensive if persistently elevated.       Relevant Medications   atorvastatin  (LIPITOR) 10 MG tablet   Vitamin B12 deficiency   Continue  b12 replacement MWF      Occipital neuralgia   Seeing neurology. Recent cervical MRI just read        Meds ordered this encounter  Medications   Magnesium 400 MG TABS    Sig: Take 1 tablet by mouth daily.   cyanocobalamin  (VITAMIN B12) 1000 MCG tablet    Sig: Take 1 tablet (1,000 mcg total) by mouth every Monday, Wednesday, and Friday.    No orders of the defined types were placed in this encounter.   Patient Instructions  Continue magnesium and b12 supplementation as well as atorvastatin .  Blood pressures are running higher than ideal.  Work on low salt/sodium diet - goal <2 grams (2,000mg ) per day. Eat a diet high in fruits/vegetables and whole grains.  Look into mediterranean and DASH diet. Goal activity is 19min/wk of moderate intensity exercise.  This can be split into 30 minute chunks.  If you are not at this level, you can start with smaller 10-15 min increments and slowly build up activity. Look at www.heart.org for more resources  Return for physical in November.  Good to see you today.   Follow up plan: Return if symptoms worsen or fail to improve.  Anton Blas, MD

## 2023-07-16 NOTE — Assessment & Plan Note (Addendum)
 Saw neurology, diagnosed with vestibular migraines.  Discussed with patient. Rec flexeril  PRN during episodes.  Continue PT.  Continue magnesium preventatively

## 2023-07-16 NOTE — Patient Instructions (Addendum)
 Continue magnesium and b12 supplementation as well as atorvastatin .  Blood pressures are running higher than ideal.  Work on low salt/sodium diet - goal <2 grams (2,000mg ) per day. Eat a diet high in fruits/vegetables and whole grains.  Look into mediterranean and DASH diet. Goal activity is 165min/wk of moderate intensity exercise.  This can be split into 30 minute chunks.  If you are not at this level, you can start with smaller 10-15 min increments and slowly build up activity. Look at www.heart.org for more resources  Return for physical in November.  Good to see you today.
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
## 2023-07-16 NOTE — Assessment & Plan Note (Addendum)
 Chronic, reviewed statin use leading to improved cholesterol control.  ALT mildly elevated - will monitor this.  Previous Lp(a) 26 (09/2022) The 10-year ASCVD risk score (Arnett DK, et al., 2019) is: 2.5%   Values used to calculate the score:     Age: 53 years     Clincally relevant sex: Female     Is Non-Hispanic African American: No     Diabetic: No     Tobacco smoker: No     Systolic Blood Pressure: 138 mmHg     Is BP treated: No     HDL Cholesterol: 38.6 mg/dL     Total Cholesterol: 168 mg/dL

## 2023-07-17 ENCOUNTER — Encounter: Payer: Self-pay | Admitting: Family Medicine

## 2023-07-17 ENCOUNTER — Ambulatory Visit

## 2023-07-24 ENCOUNTER — Ambulatory Visit: Admitting: Dermatology

## 2023-07-30 ENCOUNTER — Other Ambulatory Visit: Payer: Self-pay | Admitting: Family Medicine

## 2023-07-30 DIAGNOSIS — F4322 Adjustment disorder with anxiety: Secondary | ICD-10-CM

## 2023-07-31 NOTE — Telephone Encounter (Signed)
 ERx

## 2023-08-25 ENCOUNTER — Encounter: Payer: Self-pay | Admitting: Family Medicine

## 2023-08-27 MED ORDER — ONDANSETRON HCL 4 MG PO TABS: 4.0000 mg | ORAL_TABLET | Freq: Three times a day (TID) | ORAL | 0 refills | Status: DC | PRN

## 2023-08-27 NOTE — Addendum Note (Signed)
 Addended by: RILLA BALLER on: 08/27/2023 02:30 PM   Modules accepted: Orders

## 2023-08-27 NOTE — Telephone Encounter (Signed)
 Spoke with pt asking about sxs. States she has same sxs other family members had, which only lasted a day or 2. Pt's sxs started 08/26/23. States Zofran  is helping. I offered OV with Bableen at 10:20. Pt declined stating she's only be able to do VV after 12:30. Plz advise via MyChart, per pt request.

## 2023-10-08 ENCOUNTER — Other Ambulatory Visit: Payer: Federal, State, Local not specified - PPO

## 2023-10-15 ENCOUNTER — Encounter: Payer: Federal, State, Local not specified - PPO | Admitting: Family Medicine

## 2023-10-22 ENCOUNTER — Other Ambulatory Visit

## 2023-10-24 ENCOUNTER — Other Ambulatory Visit: Payer: Self-pay | Admitting: Family Medicine

## 2023-10-24 DIAGNOSIS — F4322 Adjustment disorder with anxiety: Secondary | ICD-10-CM

## 2023-10-24 NOTE — Telephone Encounter (Signed)
 Name of Medication:  Alprazolam  Name of Pharmacy:  [facility] Last Fill or Written Date and Quantity:  07/31/23, #30 Last Office Visit and Type:  07/16/23, dizziness; lab review Next Office Visit and Type:  11/27/23, CPE Last Controlled Substance Agreement Date:  02/27/17 Last UDS:  02/27/17

## 2023-10-25 NOTE — Telephone Encounter (Signed)
 ERx

## 2023-11-02 ENCOUNTER — Other Ambulatory Visit: Payer: Self-pay | Admitting: Family Medicine

## 2023-11-19 ENCOUNTER — Other Ambulatory Visit: Payer: Self-pay | Admitting: Family Medicine

## 2023-11-19 DIAGNOSIS — E538 Deficiency of other specified B group vitamins: Secondary | ICD-10-CM

## 2023-11-19 DIAGNOSIS — R7303 Prediabetes: Secondary | ICD-10-CM

## 2023-11-19 DIAGNOSIS — E782 Mixed hyperlipidemia: Secondary | ICD-10-CM

## 2023-11-20 ENCOUNTER — Other Ambulatory Visit (INDEPENDENT_AMBULATORY_CARE_PROVIDER_SITE_OTHER)

## 2023-11-20 ENCOUNTER — Other Ambulatory Visit

## 2023-11-20 DIAGNOSIS — E782 Mixed hyperlipidemia: Secondary | ICD-10-CM

## 2023-11-20 DIAGNOSIS — E538 Deficiency of other specified B group vitamins: Secondary | ICD-10-CM

## 2023-11-20 DIAGNOSIS — R7303 Prediabetes: Secondary | ICD-10-CM | POA: Diagnosis not present

## 2023-11-20 LAB — LIPID PANEL
Cholesterol: 233 mg/dL — ABNORMAL HIGH (ref 0–200)
HDL: 46 mg/dL (ref >39.00)
LDL Cholesterol: 137 mg/dL — ABNORMAL HIGH (ref 0–99)
NonHDL: 187.33
Total CHOL/HDL Ratio: 5
Triglycerides: 250 mg/dL — ABNORMAL HIGH (ref 0.0–149.0)
VLDL: 50 mg/dL — ABNORMAL HIGH (ref 0.0–40.0)

## 2023-11-20 LAB — COMPREHENSIVE METABOLIC PANEL WITH GFR
ALT: 26 U/L (ref 0–35)
AST: 19 U/L (ref 0–37)
Albumin: 4.7 g/dL (ref 3.5–5.2)
Alkaline Phosphatase: 79 U/L (ref 39–117)
BUN: 12 mg/dL (ref 6–23)
CO2: 28 meq/L (ref 19–32)
Calcium: 9.6 mg/dL (ref 8.4–10.5)
Chloride: 103 meq/L (ref 96–112)
Creatinine, Ser: 0.55 mg/dL (ref 0.40–1.20)
GFR: 103.8 mL/min (ref >60.00)
Glucose, Bld: 101 mg/dL — ABNORMAL HIGH (ref 70–99)
Potassium: 4.7 meq/L (ref 3.5–5.1)
Sodium: 139 meq/L (ref 135–145)
Total Bilirubin: 0.5 mg/dL (ref 0.2–1.2)
Total Protein: 7.7 g/dL (ref 6.0–8.3)

## 2023-11-20 LAB — HEMOGLOBIN A1C: Hgb A1c MFr Bld: 6.1 % (ref 4.6–6.5)

## 2023-11-20 LAB — VITAMIN B12: Vitamin B-12: 327 pg/mL (ref 211–911)

## 2023-11-27 ENCOUNTER — Ambulatory Visit (INDEPENDENT_AMBULATORY_CARE_PROVIDER_SITE_OTHER): Admitting: Family Medicine

## 2023-11-27 ENCOUNTER — Encounter: Payer: Self-pay | Admitting: Family Medicine

## 2023-11-27 ENCOUNTER — Ambulatory Visit: Payer: Self-pay | Admitting: Family Medicine

## 2023-11-27 VITALS — BP 122/78 | HR 83 | Temp 98.4°F | Ht 63.0 in | Wt 147.5 lb

## 2023-11-27 DIAGNOSIS — R7303 Prediabetes: Secondary | ICD-10-CM | POA: Diagnosis not present

## 2023-11-27 DIAGNOSIS — E782 Mixed hyperlipidemia: Secondary | ICD-10-CM

## 2023-11-27 DIAGNOSIS — E538 Deficiency of other specified B group vitamins: Secondary | ICD-10-CM

## 2023-11-27 DIAGNOSIS — Z800 Family history of malignant neoplasm of digestive organs: Secondary | ICD-10-CM | POA: Insufficient documentation

## 2023-11-27 DIAGNOSIS — G43809 Other migraine, not intractable, without status migrainosus: Secondary | ICD-10-CM

## 2023-11-27 DIAGNOSIS — Z0000 Encounter for general adult medical examination without abnormal findings: Secondary | ICD-10-CM

## 2023-11-27 DIAGNOSIS — I10 Essential (primary) hypertension: Secondary | ICD-10-CM

## 2023-11-27 DIAGNOSIS — F4322 Adjustment disorder with anxiety: Secondary | ICD-10-CM

## 2023-11-27 LAB — POC URINALSYSI DIPSTICK (AUTOMATED)
Bilirubin, UA: NEGATIVE
Blood, UA: NEGATIVE
Glucose, UA: NEGATIVE
Ketones, UA: NEGATIVE
Leukocytes, UA: NEGATIVE
Nitrite, UA: NEGATIVE
Protein, UA: NEGATIVE
Spec Grav, UA: 1.025 (ref 1.010–1.025)
Urobilinogen, UA: 0.2 U/dL
pH, UA: 6 (ref 5.0–8.0)

## 2023-11-27 MED ORDER — DESONIDE 0.05 % EX CREA: TOPICAL_CREAM | Freq: Two times a day (BID) | CUTANEOUS | 1 refills | Status: AC

## 2023-11-27 NOTE — Assessment & Plan Note (Signed)
 Continue PRN xanax  which also helps with mild insomnia.

## 2023-11-27 NOTE — Assessment & Plan Note (Signed)
Reviewed limiting added sugar/carbs in diet

## 2023-11-27 NOTE — Assessment & Plan Note (Signed)
 Rec restart b12 MWF supplementation.

## 2023-11-27 NOTE — Assessment & Plan Note (Signed)
 Chronic, deteriorated. Reviewed diet choices to improve cholesterol control.  Recommend restart atorvastatin  10mg  MWF.  The 10-year ASCVD risk score (Arnett DK, et al., 2019) is: 2.3%   Values used to calculate the score:     Age: 53 years     Clincally relevant sex: Female     Is Non-Hispanic African American: No     Diabetic: No     Tobacco smoker: No     Systolic Blood Pressure: 122 mmHg     Is BP treated: No     HDL Cholesterol: 46 mg/dL     Total Cholesterol: 233 mg/dL

## 2023-11-27 NOTE — Progress Notes (Signed)
 Ph: [phone] Fax: [phone]   Patient ID: Krista Perez, female    DOB: [birth year], 53 y.o.   MRN: [id number]  This visit was conducted in person.  BP 122/78   Pulse 83   Temp 98.4 F (36.9 C) (Oral)   Ht 5' 3 (1.6 m)   Wt 147 lb 8 oz (66.9 kg)   LMP 09/11/2012   SpO2 99%   BMI 26.13 kg/m    CC: CPE Subjective:   HPI: Krista Perez is a 53 y.o. female presenting on 11/27/2023 for Annual Exam   Vestibular migraines diagnosed by neurology Dr Maree 02/2022 as well as occipital neuralgia. Has been managed with magnesium  supplementation. Did not respond well to topamax. S/p vestibular rehab through [facility] 05/2023. Now using baclofen with benefit.   Preventative: Colon cancer screening - Cologuard normal 11/2021.  Well woman with GYN Dr Herchel - ASCUS with negative HR HPV on 12/08/2013. Pap 11/2014 WNL. ASCUS with negative HR HPV on 01/2017 and 01/2018, 11/2021, 11/2022.  [facility] ~2016 - menopausal.  Lung cancer screening - not eligible  Flu shot - declines due to worsening migraines Prevnar-20 discussed, declines COVD vaccine Pfizer 02/2019, 03/2019, no booster Td 2011. Tdap - declines  Completed hep B. Shingrix - discussed, declines  Seat belt use discussed  Sunscreen use discussed. No changing moles. Sees dermatologist q5yrs Sleep - averaging 7 hours/night  Non smoker Alcohol - rare  Dentist - q6 mo  Eye exam yearly   Caffeine: rare decaf   Lives with husband and daughter (2009) and 1 dog   Occupation: orthoptist, housewife. Prior lived in [place] Activity: swimming, gym twice weekly Diet: cooks at home. good water, fruits/vegetables daily      Relevant past medical, surgical, family and social history reviewed and updated as indicated. Interim medical history since our last visit reviewed. Allergies and medications reviewed and updated. Outpatient Medications Prior to Visit  Medication Sig Dispense Refill    ALPRAZolam  (XANAX ) 0.5 MG tablet TAKE 1/2 TO 1 TABLET BY MOUTH AT BEDTIME AS NEEDED FOR SLEEP 30 tablet 0   atorvastatin  (LIPITOR) 10 MG tablet Take 1 tablet (10 mg total) by mouth every Monday, Wednesday, and Friday.     baclofen (LIORESAL) 10 MG tablet Take 10 mg by mouth 2 (two) times daily as needed.     cyanocobalamin  (VITAMIN B12) 1000 MCG tablet Take 1 tablet (1,000 mcg total) by mouth every Monday, Wednesday, and Friday.     hydroquinone  4 % cream Apply topically at bedtime. Apply to darker areas only every night for 3 months 28.35 g 2   Magnesium  400 MG TABS Take 1 tablet by mouth daily.     cyclobenzaprine  (FLEXERIL ) 10 MG tablet TAKE 0.5-1 TABS 2 TIMES DAILY AS NEEDED FOR UPTO 10 DAYS FOR MUSCLE SPASMS (CAUTION: SEDATION) 30 tablet 0   ondansetron  (ZOFRAN ) 4 MG tablet Take 1 tablet (4 mg total) by mouth every 8 (eight) hours as needed for nausea or vomiting. (Patient not taking: Reported on 11/27/2023) 20 tablet 0   No facility-administered medications prior to visit.     Per HPI unless specifically indicated in ROS section below Review of Systems  Constitutional:  Negative for activity change, appetite change, chills, fatigue, fever and unexpected weight change.  HENT:  Negative for hearing loss.   Eyes:  Negative for visual disturbance.  Respiratory:  Negative for cough, chest tightness, shortness of breath and wheezing.   Cardiovascular:  Negative for chest pain, palpitations and leg swelling.  Gastrointestinal:  Negative for abdominal distention, abdominal pain, blood in stool, constipation, diarrhea, nausea and vomiting.  Genitourinary:  Negative for difficulty urinating and hematuria.  Musculoskeletal:  Negative for arthralgias, myalgias and neck pain.  Skin:  Negative for rash.  Neurological:  Positive for headaches. Negative for dizziness, seizures and syncope.  Hematological:  Negative for adenopathy. Does not bruise/bleed easily.  Psychiatric/Behavioral:  Negative for  dysphoric mood. The patient is not nervous/anxious.     Objective:  BP 122/78   Pulse 83   Temp 98.4 F (36.9 C) (Oral)   Ht 5' 3 (1.6 m)   Wt 147 lb 8 oz (66.9 kg)   LMP 09/11/2012   SpO2 99%   BMI 26.13 kg/m   Wt Readings from Last 3 Encounters:  11/27/23 147 lb 8 oz (66.9 kg)  07/16/23 143 lb 8 oz (65.1 kg)  06/20/23 146 lb (66.2 kg)      Physical Exam Vitals and nursing note reviewed.  Constitutional:      Appearance: Normal appearance. She is not ill-appearing.  HENT:     Head: Normocephalic and atraumatic.     Right Ear: Tympanic membrane, ear canal and external ear normal. There is no impacted cerumen.     Left Ear: Tympanic membrane, ear canal and external ear normal. There is no impacted cerumen.     Mouth/Throat:     Mouth: Mucous membranes are moist.     Pharynx: Oropharynx is clear. No oropharyngeal exudate or posterior oropharyngeal erythema.  Eyes:     General:        Right eye: No discharge.        Left eye: No discharge.     Extraocular Movements: Extraocular movements intact.     Conjunctiva/sclera: Conjunctivae normal.     Pupils: Pupils are equal, round, and reactive to light.  Neck:     Thyroid : No thyroid  mass or thyromegaly.  Cardiovascular:     Rate and Rhythm: Normal rate and regular rhythm.     Pulses: Normal pulses.     Heart sounds: Normal heart sounds. No murmur heard. Pulmonary:     Effort: Pulmonary effort is normal. No respiratory distress.     Breath sounds: Normal breath sounds. No wheezing, rhonchi or rales.  Abdominal:     General: Bowel sounds are normal. There is no distension.     Palpations: Abdomen is soft. There is no mass.     Tenderness: There is no abdominal tenderness. There is no guarding or rebound.     Hernia: No hernia is present.  Musculoskeletal:     Cervical back: Normal range of motion and neck supple. No rigidity.     Right lower leg: No edema.     Left lower leg: No edema.  Lymphadenopathy:     Cervical:  No cervical adenopathy.  Skin:    General: Skin is warm and dry.     Findings: No rash.  Neurological:     General: No focal deficit present.     Mental Status: She is alert. Mental status is at baseline.  Psychiatric:        Mood and Affect: Mood normal.        Behavior: Behavior normal.       Results for orders placed or performed in visit on 11/27/23  POCT Urinalysis Dipstick (Automated)   Collection Time: 11/27/23  9:30 AM  Result Value Ref Range   Color, UA  yellow    Clarity, UA clear    Glucose, UA Negative Negative   Bilirubin, UA negative    Ketones, UA negative    Spec Grav, UA 1.025 1.010 - 1.025   Blood, UA negative    pH, UA 6.0 5.0 - 8.0   Protein, UA Negative Negative   Urobilinogen, UA 0.2 0.2 or 1.0 E.U./dL   Nitrite, UA negative    Leukocytes, UA Negative Negative    Assessment & Plan:   Problem List Items Addressed This Visit     Health maintenance examination - Primary (Chronic)   Preventative protocols reviewed and updated unless pt declined. Discussed healthy diet and lifestyle.  She is interested in genetic testing - provided with pamphlet on [employer].       Adjustment disorder   Continue PRN xanax  which also helps with mild insomnia.       HLD (hyperlipidemia)   Chronic, deteriorated. Reviewed diet choices to improve cholesterol control.  Recommend restart atorvastatin  10mg  MWF.  The 10-year ASCVD risk score (Arnett DK, et al., 2019) is: 2.3%   Values used to calculate the score:     Age: 38 years     Clincally relevant sex: Female     Is Non-Hispanic African American: No     Diabetic: No     Tobacco smoker: No     Systolic Blood Pressure: 122 mmHg     Is BP treated: No     HDL Cholesterol: 46 mg/dL     Total Cholesterol: 233 mg/dL       Vestibular migraine   Appreciate neurology care - manages with baclofen PRN with benefit.       Relevant Medications   baclofen (LIORESAL) 10 MG tablet   Elevated blood pressure reading with  diagnosis of hypertension   BP well controlled today.       Vitamin B12 deficiency   Rec restart b12 MWF supplementation.       Prediabetes   Reviewed limiting added sugar/carbs in diet.       Family history of pancreatic cancer   Other Visit Diagnoses       Annual physical exam       Relevant Orders   POCT Urinalysis Dipstick (Automated) (Completed)        Meds ordered this encounter  Medications   desonide  (DESOWEN ) 0.05 % cream    Sig: Apply topically 2 (two) times daily. Apply to AA.    Dispense:  15 g    Refill:  1    Orders Placed This Encounter  Procedures   POCT Urinalysis Dipstick (Automated)    Patient Instructions  Consider tetanus shot, consider shigles shots, consider pneumonia shot.  Look into [employer] - pamphlet provided today.  Good to see you today Return in 1 year for next physical  Follow up plan: Return in about 1 year (around 11/26/2024) for annual exam, prior fasting for blood work.  Anton Blas, MD

## 2023-11-27 NOTE — Assessment & Plan Note (Addendum)
 Preventative protocols reviewed and updated unless pt declined. Discussed healthy diet and lifestyle.  She is interested in genetic testing - provided with pamphlet on [employer].

## 2023-11-27 NOTE — Patient Instructions (Addendum)
 Consider tetanus shot, consider shigles shots, consider pneumonia shot.  Look into [employer] - pamphlet provided today.  Good to see you today Return in 1 year for next physical

## 2023-11-27 NOTE — Assessment & Plan Note (Signed)
 BP well controlled today.

## 2023-11-27 NOTE — Assessment & Plan Note (Signed)
 Appreciate neurology care - manages with baclofen PRN with benefit.

## 2023-12-07 ENCOUNTER — Other Ambulatory Visit

## 2023-12-14 ENCOUNTER — Encounter: Admitting: Family Medicine

## 2023-12-17 MED ORDER — ATORVASTATIN CALCIUM 10 MG PO TABS: 10.0000 mg | ORAL_TABLET | ORAL | 3 refills | Status: AC

## 2023-12-24 ENCOUNTER — Encounter: Payer: Self-pay | Admitting: Obstetrics & Gynecology

## 2023-12-24 ENCOUNTER — Ambulatory Visit: Admitting: Obstetrics & Gynecology

## 2023-12-24 VITALS — BP 130/83 | HR 86 | Ht 63.0 in | Wt 148.4 lb

## 2023-12-24 DIAGNOSIS — Z01419 Encounter for gynecological examination (general) (routine) without abnormal findings: Secondary | ICD-10-CM | POA: Diagnosis not present

## 2023-12-24 NOTE — Progress Notes (Signed)
 GYNECOLOGY ANNUAL PREVENTATIVE CARE ENCOUNTER NOTE  History:     Krista Perez is a 53 y.o. [phone] female here for a routine annual gynecologic exam.  Current complaints: intermittent upper right breast pain, but had negative mammogram.  Feels it is muscular, related to lifting weights.   Denies abnormal vaginal bleeding, discharge, pelvic pain, problems with intercourse or other gynecologic concerns.    Gynecologic History Patient's last menstrual period was 09/11/2012. Contraception: post menopausal status Last Pap: 12/12/2022. Result was normal with negative HPV Last Mammogram: 6/252025.  Result was normal Last Cologuard: 12/22/2021.  Result was negative.  Obstetric History OB History  Gravida Para Term Preterm AB Living  3 1 0 1 2 1   SAB IAB Ectopic Multiple Live Births  2    1    # Outcome Date GA Lbr Len/2nd Weight Sex Type Anes PTL Lv  3 Preterm 07/06/07 [redacted]w[redacted]d   F CS-LTranv   LIV     Complications: Severe preeclampsia  2 SAB           1 SAB             Past Medical History:  Diagnosis Date   Chest pain    a. 06/2014 - eval in [place] for sharp c/p;  b. 01/2015 ED vist, neg trop.   Complication of anesthesia    woke up with migraine per patient   COVID-19 virus infection 01/12/2020   Dyspnea    Generalized headaches    frequent   History of hepatitis    a. Hep A after contaminated food, cleared   Hx of migraines    Hyperlipidemia    a. 01/2014 TC 215, TG 139, HDL 44, LDL 143.   Palpitations    Vaginal Pap smear, abnormal     Past Surgical History:  Procedure Laterality Date   APPENDECTOMY  1990   AUGMENTATION MAMMAPLASTY Bilateral    explant 2022   BREAST ENHANCEMENT SURGERY     BREAST IMPLANT REMOVAL Bilateral 02/25/2021   Procedure: REMOVAL OF BILATERAL BREAST IMPLANTS AND CAPSULECTOMIES;  Surgeon: Arelia Filippo, MD;  Location: [facility];  Service: Plastics;  Laterality: Bilateral;   TONSILLECTOMY  1980   WISDOM TOOTH EXTRACTION   10/05/2022    Current Outpatient Medications on File Prior to Visit  Medication Sig Dispense Refill   ALPRAZolam  (XANAX ) 0.5 MG tablet TAKE 1/2 TO 1 TABLET BY MOUTH AT BEDTIME AS NEEDED FOR SLEEP 30 tablet 0   atorvastatin  (LIPITOR) 10 MG tablet Take 1 tablet (10 mg total) by mouth every Monday, Wednesday, and Friday. 45 tablet 3   baclofen (LIORESAL) 10 MG tablet Take 10 mg by mouth 2 (two) times daily as needed.     cyanocobalamin  (VITAMIN B12) 1000 MCG tablet Take 1 tablet (1,000 mcg total) by mouth every Monday, Wednesday, and Friday.     Magnesium  400 MG TABS Take 1 tablet by mouth daily.     desonide  (DESOWEN ) 0.05 % cream Apply topically 2 (two) times daily. Apply to AA. (Patient not taking: Reported on 12/24/2023) 15 g 1   hydroquinone  4 % cream Apply topically at bedtime. Apply to darker areas only every night for 3 months (Patient not taking: Reported on 12/24/2023) 28.35 g 2   No current facility-administered medications on file prior to visit.    No Known Allergies  Social History:  reports that she has never smoked. She has never used smokeless tobacco. She reports current alcohol use. She reports that she  does not use drugs.  Family History  Problem Relation Age of Onset   Cancer Maternal Grandfather 40       lung (smoker)   Cancer Maternal Grandmother 73       leukemia   Pancreatic cancer Mother 32       Terminal cancer dx in her 82's - still alive, lives in [place].   Other Father        alive and well, 28, lives in [place], [place]   Other Brother        alive and well, lives in [place], [place].   Cancer Paternal Uncle        lung (smoker)   Cancer Paternal Uncle        lung (smoker)   Coronary artery disease Neg Hx    Stroke Neg Hx    Diabetes Neg Hx    Hypertension Neg Hx     The following portions of the patient's history were reviewed and updated as appropriate: allergies, current medications, past family history, past medical history, past social history, past surgical  history and problem list.  Review of Systems Pertinent items noted in HPI and remainder of comprehensive ROS otherwise negative.  Physical Exam:  BP 130/83   Pulse 86   Ht 5' 3 (1.6 m)   Wt 148 lb 6 oz (67.3 kg)   LMP 09/11/2012   BMI 26.28 kg/m  CONSTITUTIONAL: Well-developed, well-nourished female in no acute distress.  HENT:  Normocephalic, atraumatic, External right and left ear normal.  EYES: Conjunctivae and EOM are normal. Pupils are equal, round, and reactive to light. No scleral icterus.  NECK: Normal range of motion, supple, no masses.  Normal thyroid .  SKIN: Skin is warm and dry. No rash noted. Not diaphoretic. No erythema. No pallor. MUSCULOSKELETAL: Normal range of motion. No tenderness.  No cyanosis, clubbing, or edema. NEUROLOGIC: Alert and oriented to person, place, and time. Normal reflexes, muscle tone coordination.  PSYCHIATRIC: Normal mood and affect. Normal behavior. Normal judgment and thought content. CARDIOVASCULAR: Normal heart rate noted, regular rhythm RESPIRATORY: Clear to auscultation bilaterally. Effort and breath sounds normal, no problems with respiration noted. BREASTS: Symmetric in size. No masses, tenderness, skin changes, nipple drainage, or lymphadenopathy bilaterally. Performed in the presence of a chaperone. ABDOMEN: Soft, no distention noted.  No tenderness, rebound or guarding.  PELVIC: Deferred   Assessment and Plan:     1. Well woman exam with routine gynecological exam Pap smear is up to date. Normal breast examination today, she was advised to perform periodic self breast examinations.    Reassured by normal examination, pain is likely muscular as thought Mammogram and colon cancer screening are up to date. Routine preventative health maintenance measures emphasized. Please refer to After Visit Summary for other counseling recommendations.      GLORIS HUGGER, MD, FACOG Obstetrician & Gynecologist, [facility]  [employer], [facility]

## 2023-12-25 ENCOUNTER — Ambulatory Visit: Admitting: Dermatology

## 2024-02-19 ENCOUNTER — Telehealth: Payer: Self-pay | Admitting: Family Medicine

## 2024-02-19 DIAGNOSIS — G43809 Other migraine, not intractable, without status migrainosus: Secondary | ICD-10-CM

## 2024-02-19 DIAGNOSIS — R42 Dizziness and giddiness: Secondary | ICD-10-CM

## 2024-02-19 NOTE — Telephone Encounter (Signed)
 Received request for new vestibular PT referral - placed

## 2024-02-20 ENCOUNTER — Ambulatory Visit: Attending: Family Medicine

## 2024-02-20 DIAGNOSIS — G43809 Other migraine, not intractable, without status migrainosus: Secondary | ICD-10-CM | POA: Diagnosis not present

## 2024-02-20 DIAGNOSIS — R42 Dizziness and giddiness: Secondary | ICD-10-CM | POA: Insufficient documentation

## 2024-02-20 DIAGNOSIS — M542 Cervicalgia: Secondary | ICD-10-CM | POA: Diagnosis present

## 2024-02-20 DIAGNOSIS — M5382 Other specified dorsopathies, cervical region: Secondary | ICD-10-CM | POA: Diagnosis present

## 2024-02-20 NOTE — Therapy (Signed)
 " OUTPATIENT PHYSICAL THERAPY CERVICAL EVALUATION   Patient Name: Krista Perez MRN: [id number] DOB:1970/12/10, 54 y.o., female Today's Date: 02/20/2024  END OF SESSION:  PT End of Session - 02/20/24 1452     Visit Number 1    Number of Visits 24    Date for Recertification  05/14/24    Progress Note Due on Visit 10    PT Start Time 1446    PT Stop Time 1537    PT Time Calculation (min) 51 min    Activity Tolerance Patient tolerated treatment well;No increased pain    Behavior During Therapy [facility] for tasks assessed/performed          Past Medical History:  Diagnosis Date   Chest pain    a. 06/2014 - eval in [place] for sharp c/p;  b. 01/2015 ED vist, neg trop.   Complication of anesthesia    woke up with migraine per patient   COVID-19 virus infection 01/12/2020   Dyspnea    Generalized headaches    frequent   History of hepatitis    a. Hep A after contaminated food, cleared   Hx of migraines    Hyperlipidemia    a. 01/2014 TC 215, TG 139, HDL 44, LDL 143.   Palpitations    Vaginal Pap smear, abnormal    Past Surgical History:  Procedure Laterality Date   APPENDECTOMY  1990   AUGMENTATION MAMMAPLASTY Bilateral    explant 2022   BREAST ENHANCEMENT SURGERY     BREAST IMPLANT REMOVAL Bilateral 02/25/2021   Procedure: REMOVAL OF BILATERAL BREAST IMPLANTS AND CAPSULECTOMIES;  Surgeon: Arelia Filippo, MD;  Location: [facility];  Service: Plastics;  Laterality: Bilateral;   TONSILLECTOMY  1980   WISDOM TOOTH EXTRACTION  10/05/2022   Patient Active Problem List   Diagnosis Date Noted   Family history of pancreatic cancer 11/27/2023   Occipital neuralgia 07/16/2023   Prediabetes 10/01/2022   Dermatitis of ear canal, bilateral 04/21/2020   Vitamin B12 deficiency 04/20/2020   Elevated blood pressure reading with diagnosis of hypertension 06/13/2018   Vestibular migraine 02/27/2017   Neck pain on right side 02/27/2017   ASCUS of cervix with negative high  risk HPV 02/13/2017   Arthralgia 08/25/2015   Health maintenance examination 02/05/2014   Palpitations 02/05/2014   Postmenopausal 12/17/2012   HLD (hyperlipidemia) 10/07/2012   Headache 09/11/2011   Adjustment disorder 06/21/2011   History of hepatitis A    Hx of migraines     PCP: Dr. Anton Beard  REFERRING PROVIDER: Dr. Anton Beard   REFERRING DIAG:  M54.2 (ICD-10-CM) - Cervicalgia  R42 (ICD-10-CM) - Dizziness and giddiness  G43.809 (ICD-10-CM) - Other migraine, not intractable, without status migrainosus    THERAPY DIAG:  Cervicalgia - Plan: PT plan of care cert/re-cert  Limited active range of motion (AROM) of cervical spine on lateral flexion to left - Plan: PT plan of care cert/re-cert  Limited active range of motion (AROM) of cervical spine on lateral flexion to right - Plan: PT plan of care cert/re-cert  Rationale for Evaluation and Treatment: Rehabilitation  ONSET DATE: 2 years ago- comes and goes approx every  6 to 7 months  SUBJECTIVE:  SUBJECTIVE STATEMENT: Patient reports vertigo 2-3 days ago (not 100% - like eyes not focusing - worse yesterday- did the Epley maneuver- and it went away) which is surprising. Along with vertigo- experiencing some right sided neck pain. Had MRI last year with negative findings.  Hand dominance: Right  PERTINENT HISTORY:  Neurology includes history of Occipital Neuralgia, Myofascial pain syndrome, Chronic vestibular migraine with episodes of vertigo (intermittent)   PAIN:  Are you having pain? Yes: NPRS scale: 3/10 current; at worst= 6/10; Best= 0/10 Pain location: Right sided neck pain and temporal head pain Pain description: some burning; electrical pain into my head, sharp. Aggravating factors: Positional - slouching,  looking down at my phone.  Relieving factors: Tylenol , doing stretches, Massage, heat  PRECAUTIONS: None  RED FLAGS: None     WEIGHT BEARING RESTRICTIONS: No  FALLS:  Has patient fallen in last 6 months? 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   NEXT MD VISIT: None  OBJECTIVE:  Note: Objective measures were completed at Evaluation unless otherwise noted.  DIAGNOSTIC FINDINGS:  Narrative & Impression  CLINICAL DATA:  Initial evaluation for neck pain.   EXAM: MRI CERVICAL SPINE WITHOUT CONTRAST   TECHNIQUE: Multiplanar, multisequence MR imaging of the cervical spine was performed. No intravenous contrast was administered.   COMPARISON:  Prior radiograph from 03/12/2019.   FINDINGS: Alignment: Straightening with mild reversal of the normal cervical lordosis. No significant listhesis.   Vertebrae: Vertebral body height maintained without acute or chronic fracture. Bone marrow signal intensity within normal limits. Benign hemangioma noted within the T3 vertebral body. No other discrete or worrisome osseous lesions. No abnormal marrow edema.   Cord: Normal signal and morphology.   Posterior Fossa, vertebral arteries, paraspinal tissues: Unremarkable.   Disc levels:   C2-C3: Negative interspace. Minimal right-sided facet spurring. No canal or foraminal stenosis.   C3-C4: Small central disc protrusion minimally indents the ventral thecal sac (series 9, image 8). Minimal right-sided facet hypertrophy. No spinal stenosis. Foramina remain patent.   C4-C5: Small central disc protrusion indents the ventral thecal sac (series 9, image 13). Mild bilateral uncovertebral spurring. Mild left greater than right  facet hypertrophy. No spinal stenosis. Mild left C5 foraminal narrowing. Right neural foramina remains adequately patent.   C5-C6: Tiny left paracentral disc protrusion indents the ventral thecal sac (series 9, image 17). Mild bilateral uncovertebral spurring. Mild facet degeneration. No spinal stenosis. Mild left C6 foraminal narrowing. Right neural foramina remains adequately patent.   C6-C7: Mild disc bulge with uncovertebral spurring. No spinal stenosis. Foramina remain patent.   C7-T1: Normal interspace. Mild right worse than left facet hypertrophy. No spinal stenosis. Foramina remain patent.   IMPRESSION: 1. Mild noncompressive disc bulging at C3-4 through C6-7 without significant spinal stenosis or neural impingement. 2. Mild left C5 and C6 foraminal narrowing related to uncovertebral and facet disease. 3. Mild multilevel facet hypertrophy as above, which could contribute to underlying neck pain.     Electronically Signed   By: Morene Hoard M.D.   On: 07/14/2023 13:20    PATIENT SURVEYS:  DHI: THE DIZZINESS HANDICAP INVENTORY (DHI)  P1. Does looking up increase your problem? 0 = No  E2. Because of your problem, do you feel frustrated? 4 = Yes  F3. Because of your problem, do you restrict your travel for business or recreation?  0 = No  P4. Does walking down the aisle of a supermarket increase your problems?  2 = Sometimes  F5. Because of your problem, do you have difficulty getting into or out of bed?  0 = No  F6. Does your problem significantly restrict your participation in social activities, such as going out to dinner, going to the movies, dancing, or going to parties? 0 = No  F7. Because of your problem, do you have difficulty reading?  0 = No  P8. Does performing more ambitious activities such as sports, dancing, household chores (sweeping or putting dishes away) increase your problems?  0 = No  E9. Because of your problem, are you afraid to leave your  home without having without having someone accompany you?  0 = No  E10. Because of your problem have you been embarrassed in front of others?  0 = No  P11. Do quick movements of your head increase your problem?  2 = Sometimes  F12. Because of your problem, do you avoid heights?  0 = No  P13. Does turning over in bed increase your problem?  0 = No  F14. Because of your problem, is it difficult for you to do strenuous homework or yard work? 0 = No  E15. Because of your problem, are you afraid people may think you are intoxicated? 0 = No  F16. Because of your problem, is it difficult for you to go for a walk by yourself?  0 = No  P17. Does walking down a sidewalk increase your problem?  0 = No  E18.Because of your problem, is it difficult for you to concentrate 2 = Sometimes  F19. Because of your problem, is it difficult for you to walk around your house in the dark? 0 = No  E20. Because of your problem, are you afraid to stay home alone?  0 = No  E21. Because of your problem, do you feel handicapped? 0 = No  E22. Has the problem placed stress on your relationships with members of your family or friends? 0 = No  E23. Because of your problem, are you depressed?  0 = No  F24. Does your problem interfere with your job or household responsibilities?  2 = Sometimes  P25. Does bending over increase your problem?  2 = Sometimes  TOTAL 14    DHI Scoring Instructions  The patient is asked to answer each question as it pertains to dizziness or unsteadiness problems, specifically  considering their condition during the last month. Questions are designed to incorporate functional (F), physical  (P), and emotional (E) impacts on disability.   Scores greater than 10 points should be referred to balance specialists for further evaluation.   16-34 Points (mild handicap)  36-52 Points (moderate handicap)  54+ Points (severe handicap)  Minimally Detectable Change: 17 points ([address] [place],  1990)  Krista Perez, G. SHAUNNA. and [place], C. W. (1990). The development of the Dizziness Handicap Inventory. Archives of Otolaryngology - Head and Neck Surgery 116(4): [id number].  NDI:  NECK DISABILITY INDEX  Date: 02/20/2024 Score  Pain intensity 2 = The pain is moderate at the moment  2. Personal care (washing, dressing, etc.) 0 = I can look after myself normally without causing extra pain  3. Lifting 1 =  I can lift heavy weights but it gives extra pain  4. Reading 1 = I can read as  much as I want to with slight pain in my neck  5. Headaches 1 =  I have slight headaches, which come infrequently  6. Concentration 1 =  I can concentrate fully when I want to with slight difficulty   7. Work 1 =  I can only do my usual work, but no more  8. Driving 0 = I can drive my car without any neck pain  9. Sleeping 1 = My sleep is slightly disturbed (less than 1 hr sleepless)  10. Recreation 1 =  I am able to engage in all my recreation activities, with some pain in my neck  Total 9/50= 18%   Minimum Detectable Change (90% confidence): 5 points or 10% points  COGNITION: Overall cognitive status: Within functional limits for tasks assessed  SENSATION: WFL  POSTURE: rounded shoulders and forward head  PALPATION: Right side Cervical and occipital/Temp   CERVICAL ROM:   Active ROM A/PROM (deg) eval  Flexion 58  Extension 41  Right lateral flexion 40  Left lateral flexion 34  Right rotation 78  Left rotation 72   (Blank rows = not tested)  UPPER EXTREMITY ROM:  Active ROM Right eval Left eval  Shoulder flexion    Shoulder extension    Shoulder abduction    Shoulder adduction    Shoulder extension    Shoulder internal rotation    Shoulder external rotation    Elbow flexion    Elbow extension    Wrist flexion    Wrist extension    Wrist ulnar deviation    Wrist radial deviation    Wrist pronation    Wrist supination     (Blank rows = not tested)  UPPER EXTREMITY MMT:  MMT  Right eval Left eval  Shoulder flexion    Shoulder extension    Shoulder abduction    Shoulder adduction    Shoulder extension    Shoulder internal rotation    Shoulder external rotation    Middle trapezius    Lower trapezius    Elbow flexion    Elbow extension    Wrist flexion    Wrist extension    Wrist ulnar deviation    Wrist radial deviation    Wrist pronation    Wrist supination    Grip strength     (Blank rows = not tested)  CERVICAL SPECIAL TESTS:    FUNCTIONAL TESTS:    TREATMENT DATE: 02/20/2024 PT EVALUATION  Manual Therapy:  STM to Cervical Paraspinals/occipital region/ levator/UT/Middle trap region with several areas of tightness- most notable - R sided c4-7 region and multiple trigger points noted along levator/UT/Middle trap region.                                                                                                                                  PATIENT EDUCATION:  Education details: Purpose of PT; proposed care of care; Discussion of cervical pain vs.  Vertigo symptoms; Importance of posture and strengthening posterior upper/mid back Person educated: Patient Education method: Explanation, Demonstration, Tactile cues, and Verbal cues Education comprehension: verbalized understanding, returned demonstration, verbal cues required, tactile cues required, and needs further education  HOME EXERCISE PROGRAM: Reviewed some previous activities- chin tuck, neck Side bending and rotation activities.   ASSESSMENT:  CLINICAL IMPRESSION: The patient is a 54 year old female presenting with cervicalgia and intermittent episodes of dizziness and vertigo. Symptoms have been episodic for approximately two years with recurrence every six to seven months. She reports a recent brief episode of vertigo two to three days ago that resolved after performing the Epley maneuver, accompanied by right sided cervical pain. Her history is significant for occipital  neuralgia, myofascial pain syndrome, and chronic vestibular migraine with intermittent vertigo. Cervical symptoms today are more concerning to her than the vestibular component. On evaluation, cervical range of motion is limited in bilateral lateral flexion with left side bending most restricted. Posture shows forward head and rounded shoulders. Palpation reveals significant soft tissue tightness and multiple trigger points along the right cervical paraspinals, upper trapezius, levator scapulae, and occipital region. MRI findings from 2025 indicate mild noncompressive disc bulging at C3 through C7, mild left foraminal narrowing at C5 and C6, and multilevel facet hypertrophy, which may contribute to ongoing cervical pain but without evidence of neural compression. DHI indicates mild functional impact from dizziness, and NDI score of 18 percent reflects mild disability from neck symptoms. The patient demonstrates functional independence and intact cognition and sensation. No red flags or contraindications were identified. Her symptoms appear consistent with cervical musculoskeletal dysfunction contributing to episodic cervicogenic dizziness, layered on a background of vestibular migraine history. She is appropriate for skilled physical therapy to address cervical mobility deficits, postural impairments, soft tissue restrictions, and symptom management strategies. Interventions will include manual therapy, therapeutic exercise, postural retraining, and home exercise progression. The patients goals are to decrease dizziness and improve neck pain, and her prognosis for improvement with rehabilitation is good.  .   OBJECTIVE IMPAIRMENTS: decreased ROM, dizziness, hypomobility, impaired flexibility, postural dysfunction, and pain.   ACTIVITY LIMITATIONS: carrying, lifting, bending, standing, squatting, sleeping, stairs, transfers, and bed mobility  PARTICIPATION LIMITATIONS: meal prep, cleaning, laundry,  driving, shopping, community activity, occupation, yard work, and church  PERSONAL FACTORS: 1 comorbidity: myofascial Pain syndrome are also affecting patient's functional outcome.   REHAB POTENTIAL: Good  CLINICAL DECISION MAKING: Stable/uncomplicated  EVALUATION COMPLEXITY: Low   GOALS: Goals reviewed with patient? Yes  SHORT TERM GOALS: Target date: 04/01/2024  The patient will correctly perform her home exercise program independently for symptom management and postural retraining. Baseline: EVAL- Patient has an older HEP but will need some changes/progress based on her presentation.  Goal status: INITIAL  LONG TERM GOALS: Target date: 05/14/2024  Pt will demonstrate decrease in NDI by at least 19% in order to demonstrate clinically significant reduction in disability related to neck injury/pain  Baseline: EVAL=18% Goal status: INITIAL  2.  The patient will maintain a DHI score below 13 over the next eight to twelve weeks, demonstrating continued low symptom burden and effective management of dizziness related functional limitations. Baseline: EVAL= 14 Goal status: INITIAL  3.  Pt will decrease worst neck pain as reported on NPRS by at least 2 points in order to demonstrate clinically significant reduction in back pain.  Baseline: EVAL= worst = 6/10 Goal status: INITIAL  4.  The patient will demonstrate improved cervical AROM by 10 deg with Left  sided SB and Rotation without symptom provocation to support daily and work related tasks. Baseline: EVAL= See Chart Goal status: INITIAL    PLAN:  PT FREQUENCY: 1-2x/week  PT DURATION: 12 weeks  PLANNED INTERVENTIONS: 97164- PT Re-evaluation, 97750- Physical Performance Testing, 97110-Therapeutic exercises, 97530- Therapeutic activity, [id number]- Neuromuscular re-education, 97535- Self Care, 02859- Manual therapy, [phone]- Canalith repositioning, [id number]- Electrical stimulation (manual), [id number]- Ultrasound, 02987- Traction (mechanical),  [phone] (1-2 muscles), 20561 (3+ muscles)- Dry Needling, Patient/Family education, Taping, Joint mobilization, Joint manipulation, Spinal manipulation, Spinal mobilization, Vestibular training, Cryotherapy, and Moist heat  PLAN FOR NEXT SESSION:  Manual therapy to the right cervical paraspinals, upper trapezius, levator scapulae, and occipital region to address soft tissue tightness. Progress cervical mobility work with emphasis on lateral flexion and rotation control. Reinforce postural training with focus on scapular retraction and maintaining neutral cervical alignment. Initiate or advance deep cervical flexor strengthening as tolerated. Assess response to todays interventions and monitor for any return of dizziness to further differentiate cervical versus vestibular contributors. Review and progress the home exercise program to ensure proper technique and carryover.   Krista Perez, PT 02/20/2024, 4:38 PM      "

## 2024-02-28 ENCOUNTER — Other Ambulatory Visit: Payer: Self-pay | Admitting: Family Medicine

## 2024-02-28 DIAGNOSIS — F4322 Adjustment disorder with anxiety: Secondary | ICD-10-CM

## 2024-02-28 NOTE — Telephone Encounter (Signed)
 ERx

## 2024-02-29 ENCOUNTER — Ambulatory Visit: Admitting: Physical Therapy

## 2024-02-29 DIAGNOSIS — M542 Cervicalgia: Secondary | ICD-10-CM

## 2024-02-29 DIAGNOSIS — M5382 Other specified dorsopathies, cervical region: Secondary | ICD-10-CM

## 2024-02-29 NOTE — Therapy (Signed)
 " OUTPATIENT PHYSICAL THERAPY CERVICAL EVALUATION   Patient Name: Krista Perez MRN: [id number] DOB:1970-05-10, 54 y.o., female Today's Date: 02/29/2024  END OF SESSION:  PT End of Session - 02/29/24 0800     Visit Number 2    Number of Visits 24    Date for Recertification  05/14/24    Progress Note Due on Visit 10    PT Start Time 0802    PT Stop Time 0843    PT Time Calculation (min) 41 min    Activity Tolerance Patient tolerated treatment well;No increased pain    Behavior During Therapy [facility] for tasks assessed/performed           Past Medical History:  Diagnosis Date   Chest pain    a. 06/2014 - eval in [place] for sharp c/p;  b. 01/2015 ED vist, neg trop.   Complication of anesthesia    woke up with migraine per patient   COVID-19 virus infection 01/12/2020   Dyspnea    Generalized headaches    frequent   History of hepatitis    a. Hep A after contaminated food, cleared   Hx of migraines    Hyperlipidemia    a. 01/2014 TC 215, TG 139, HDL 44, LDL 143.   Palpitations    Vaginal Pap smear, abnormal    Past Surgical History:  Procedure Laterality Date   APPENDECTOMY  1990   AUGMENTATION MAMMAPLASTY Bilateral    explant 2022   BREAST ENHANCEMENT SURGERY     BREAST IMPLANT REMOVAL Bilateral 02/25/2021   Procedure: REMOVAL OF BILATERAL BREAST IMPLANTS AND CAPSULECTOMIES;  Surgeon: Arelia Filippo, MD;  Location: [facility];  Service: Plastics;  Laterality: Bilateral;   TONSILLECTOMY  1980   WISDOM TOOTH EXTRACTION  10/05/2022   Patient Active Problem List   Diagnosis Date Noted   Family history of pancreatic cancer 11/27/2023   Occipital neuralgia 07/16/2023   Prediabetes 10/01/2022   Dermatitis of ear canal, bilateral 04/21/2020   Vitamin B12 deficiency 04/20/2020   Elevated blood pressure reading with diagnosis of hypertension 06/13/2018   Vestibular migraine 02/27/2017   Neck pain on right side 02/27/2017   ASCUS of cervix with negative high  risk HPV 02/13/2017   Arthralgia 08/25/2015   Health maintenance examination 02/05/2014   Palpitations 02/05/2014   Postmenopausal 12/17/2012   HLD (hyperlipidemia) 10/07/2012   Headache 09/11/2011   Adjustment disorder 06/21/2011   History of hepatitis A    Hx of migraines     PCP: Dr. Anton Beard  REFERRING PROVIDER: Dr. Anton Beard   REFERRING DIAG:  M54.2 (ICD-10-CM) - Cervicalgia  R42 (ICD-10-CM) - Dizziness and giddiness  G43.809 (ICD-10-CM) - Other migraine, not intractable, without status migrainosus    THERAPY DIAG:  Cervicalgia  Limited active range of motion (AROM) of cervical spine on lateral flexion to left  Limited active range of motion (AROM) of cervical spine on lateral flexion to right  Rationale for Evaluation and Treatment: Rehabilitation  ONSET DATE: 2 years ago- comes and goes approx every  6 to 7 months  SUBJECTIVE:  SUBJECTIVE STATEMENT: Pt reports neck pain feels better this date, HEP for home management going well and is relieving for pain.   Hand dominance: Right  PERTINENT HISTORY:  Neurology includes history of Occipital Neuralgia, Myofascial pain syndrome, Chronic vestibular migraine with episodes of vertigo (intermittent)   PAIN:  Are you having pain? Yes: NPRS scale: 3/10 current; at worst= 6/10; Best= 0/10 Pain location: Right sided neck pain and temporal head pain Pain description: some burning; electrical pain into my head, sharp. Aggravating factors: Positional - slouching, looking down at my phone.  Relieving factors: Tylenol , doing stretches, Massage, heat  PRECAUTIONS: None  RED FLAGS: None     WEIGHT BEARING RESTRICTIONS: No  FALLS:  Has patient fallen in last 6 months? 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   NEXT MD VISIT: None  OBJECTIVE:  Note: Objective measures were completed at Evaluation unless otherwise noted.  DIAGNOSTIC FINDINGS:  Narrative & Impression  CLINICAL DATA:  Initial evaluation for neck pain.   EXAM: MRI CERVICAL SPINE WITHOUT CONTRAST   TECHNIQUE: Multiplanar, multisequence MR imaging of the cervical spine was performed. No intravenous contrast was administered.   COMPARISON:  Prior radiograph from 03/12/2019.   FINDINGS: Alignment: Straightening with mild reversal of the normal cervical lordosis. No significant listhesis.   Vertebrae: Vertebral body height maintained without acute or chronic fracture. Bone marrow signal intensity within normal limits. Benign hemangioma noted within the T3 vertebral body. No other discrete or worrisome osseous lesions. No abnormal marrow edema.   Cord: Normal signal and morphology.   Posterior Fossa, vertebral arteries, paraspinal tissues: Unremarkable.   Disc levels:   C2-C3: Negative interspace. Minimal right-sided facet spurring. No canal or foraminal stenosis.   C3-C4: Small central disc protrusion minimally indents the ventral thecal sac (series 9, image 8). Minimal right-sided facet hypertrophy. No spinal stenosis. Foramina remain patent.   C4-C5: Small central disc protrusion indents the ventral thecal sac (series 9, image 13). Mild bilateral uncovertebral spurring. Mild left greater than right facet hypertrophy. No spinal stenosis. Mild left C5 foraminal narrowing. Right neural foramina remains adequately patent.   C5-C6: Tiny left paracentral disc protrusion indents the ventral thecal sac (series 9, image 17). Mild bilateral  uncovertebral spurring. Mild facet degeneration. No spinal stenosis. Mild left C6 foraminal narrowing. Right neural foramina remains adequately patent.   C6-C7: Mild disc bulge with uncovertebral spurring. No spinal stenosis. Foramina remain patent.   C7-T1: Normal interspace. Mild right worse than left facet hypertrophy. No spinal stenosis. Foramina remain patent.   IMPRESSION: 1. Mild noncompressive disc bulging at C3-4 through C6-7 without significant spinal stenosis or neural impingement. 2. Mild left C5 and C6 foraminal narrowing related to uncovertebral and facet disease. 3. Mild multilevel facet hypertrophy as above, which could contribute to underlying neck pain.     Electronically Signed   By: Morene Hoard M.D.   On: 07/14/2023 13:20    PATIENT SURVEYS:  DHI: THE DIZZINESS HANDICAP INVENTORY (DHI)  P1. Does looking up increase your problem? 0 = No  E2. Because of your problem, do you feel frustrated? 4 = Yes  F3. Because of your problem, do you restrict your travel for business or recreation?  0 = No  P4. Does walking down  the aisle of a supermarket increase your problems?  2 = Sometimes  F5. Because of your problem, do you have difficulty getting into or out of bed?  0 = No  F6. Does your problem significantly restrict your participation in social activities, such as going out to dinner, going to the movies, dancing, or going to parties? 0 = No  F7. Because of your problem, do you have difficulty reading?  0 = No  P8. Does performing more ambitious activities such as sports, dancing, household chores (sweeping or putting dishes away) increase your problems?  0 = No  E9. Because of your problem, are you afraid to leave your home without having without having someone accompany you?  0 = No  E10. Because of your problem have you been embarrassed in front of others?  0 = No  P11. Do quick movements of your head increase your problem?  2 = Sometimes  F12. Because  of your problem, do you avoid heights?  0 = No  P13. Does turning over in bed increase your problem?  0 = No  F14. Because of your problem, is it difficult for you to do strenuous homework or yard work? 0 = No  E15. Because of your problem, are you afraid people may think you are intoxicated? 0 = No  F16. Because of your problem, is it difficult for you to go for a walk by yourself?  0 = No  P17. Does walking down a sidewalk increase your problem?  0 = No  E18.Because of your problem, is it difficult for you to concentrate 2 = Sometimes  F19. Because of your problem, is it difficult for you to walk around your house in the dark? 0 = No  E20. Because of your problem, are you afraid to stay home alone?  0 = No  E21. Because of your problem, do you feel handicapped? 0 = No  E22. Has the problem placed stress on your relationships with members of your family or friends? 0 = No  E23. Because of your problem, are you depressed?  0 = No  F24. Does your problem interfere with your job or household responsibilities?  2 = Sometimes  P25. Does bending over increase your problem?  2 = Sometimes  TOTAL 14    DHI Scoring Instructions  The patient is asked to answer each question as it pertains to dizziness or unsteadiness problems, specifically  considering their condition during the last month. Questions are designed to incorporate functional (F), physical  (P), and emotional (E) impacts on disability.   Scores greater than 10 points should be referred to balance specialists for further evaluation.   16-34 Points (mild handicap)  36-52 Points (moderate handicap)  54+ Points (severe handicap)  Minimally Detectable Change: 17 points ([address] [place], 1990)  [place], G. SHAUNNA. and [place], C. W. (1990). The development of the Dizziness Handicap Inventory. Archives of Otolaryngology - Head and Neck Surgery 116(4): [id number].  NDI:  NECK DISABILITY INDEX  Date: 02/20/2024 Score  Pain intensity 2 = The  pain is moderate at the moment  2. Personal care (washing, dressing, etc.) 0 = I can look after myself normally without causing extra pain  3. Lifting 1 =  I can lift heavy weights but it gives extra pain  4. Reading 1 = I can read as much as I want to with slight pain in my neck  5. Headaches 1 =  I have slight headaches, which come  infrequently  6. Concentration 1 =  I can concentrate fully when I want to with slight difficulty   7. Work 1 =  I can only do my usual work, but no more  8. Driving 0 = I can drive my car without any neck pain  9. Sleeping 1 = My sleep is slightly disturbed (less than 1 hr sleepless)  10. Recreation 1 =  I am able to engage in all my recreation activities, with some pain in my neck  Total 9/50= 18%   Minimum Detectable Change (90% confidence): 5 points or 10% points  COGNITION: Overall cognitive status: Within functional limits for tasks assessed  SENSATION: WFL  POSTURE: rounded shoulders and forward head  PALPATION: Right side Cervical and occipital/Temp   CERVICAL ROM:   Active ROM A/PROM (deg) eval  Flexion 58  Extension 41  Right lateral flexion 40  Left lateral flexion 34  Right rotation 78  Left rotation 72   (Blank rows = not tested)  UPPER EXTREMITY ROM:  Active ROM Right eval Left eval  Shoulder flexion    Shoulder extension    Shoulder abduction    Shoulder adduction    Shoulder extension    Shoulder internal rotation    Shoulder external rotation    Elbow flexion    Elbow extension    Wrist flexion    Wrist extension    Wrist ulnar deviation    Wrist radial deviation    Wrist pronation    Wrist supination     (Blank rows = not tested)  UPPER EXTREMITY MMT:  MMT Right eval Left eval  Shoulder flexion    Shoulder extension    Shoulder abduction    Shoulder adduction    Shoulder extension    Shoulder internal rotation    Shoulder external rotation    Middle trapezius    Lower trapezius    Elbow flexion     Elbow extension    Wrist flexion    Wrist extension    Wrist ulnar deviation    Wrist radial deviation    Wrist pronation    Wrist supination    Grip strength     (Blank rows = not tested)  CERVICAL SPECIAL TESTS:    FUNCTIONAL TESTS:    TREATMENT DATE: 02/29/24  Manual Therapy: - to improve comfort and mobility in target muscle groups to allow for better completion of other activities in session and after sessions.  STM to Cervical Paraspinals/occipital region, suboccipital release 2 x 45 sec, B UT stretch with Gh depression 2 x 1 min   TE- To improve strength, endurance, mobility, and function of specific targeted muscle groups or improve joint range of motion or improve muscle flexibility                                                                                                                          Chin tuck 2 x 10 with 3 sec hold  Supine HABD with RTB 2 x 12 reps  Supine ER with RTB 2 x 10 reps  Supine shoulder flexion with dowel x 10 x 5 sec hold   Transition to prone   Manual - to improve comfort and mobility in target muscle groups to allow for better completion of other activities in session and after sessions.   T spine mobs from T1-T8 grade 2-3  STM to romboids, levator, supra and infraspinatus   TE Prone I and T  -2 x 10 ea   Seated ball roll out x 10 ant x 5 sec hold     PATIENT EDUCATION:  Education details: Purpose of PT; proposed care of care; Discussion of cervical pain vs. Vertigo symptoms; Importance of posture and strengthening posterior upper/mid back Person educated: Patient Education method: Explanation, Demonstration, Tactile cues, and Verbal cues Education comprehension: verbalized understanding, returned demonstration, verbal cues required, tactile cues required, and needs further education  HOME EXERCISE PROGRAM: Reviewed some previous activities- chin tuck, neck Side bending and rotation activities.   ASSESSMENT:  CLINICAL  IMPRESSION:  The patient is a 54 year old female presenting with chronic neck pain who continues to demonstrate muscle tightness, postural weakness, and limited thoracic and cervical mobility. She responded positively to todays manual therapy, with improved soft tissue mobility noted through the cervical paraspinals, suboccipitals, upper trapezius, and scapular musculature. Therapeutic exercise focused on deep cervical flexor activation, scapular stabilization, and thoracic mobility, and she was able to complete all activities with appropriate muscle engagement. Continued emphasis on postural control and upper quarter post chain strengthening remains essential for reducing compensatory patterns and improving tolerance to daily and work related tasks. Continued physical therapy is beneficial to address ongoing soft tissue restrictions, improve cervical and thoracic mobility, and progress strengthening of the deep neck flexors and scapular stabilizers. With further skilled intervention, the patient is expected to improve functional tolerance, decrease pain frequency and intensity, and establish more efficient postural strategies to better manage her chronic symptoms.  .   OBJECTIVE IMPAIRMENTS: decreased ROM, dizziness, hypomobility, impaired flexibility, postural dysfunction, and pain.   ACTIVITY LIMITATIONS: carrying, lifting, bending, standing, squatting, sleeping, stairs, transfers, and bed mobility  PARTICIPATION LIMITATIONS: meal prep, cleaning, laundry, driving, shopping, community activity, occupation, yard work, and church  PERSONAL FACTORS: 1 comorbidity: myofascial Pain syndrome are also affecting patient's functional outcome.   REHAB POTENTIAL: Good  CLINICAL DECISION MAKING: Stable/uncomplicated  EVALUATION COMPLEXITY: Low   GOALS: Goals reviewed with patient? Yes  SHORT TERM GOALS: Target date: 04/01/2024  The patient will correctly perform her home exercise program  independently for symptom management and postural retraining. Baseline: EVAL- Patient has an older HEP but will need some changes/progress based on her presentation.  Goal status: INITIAL  LONG TERM GOALS: Target date: 05/14/2024  Pt will demonstrate decrease in NDI by at least 19% in order to demonstrate clinically significant reduction in disability related to neck injury/pain  Baseline: EVAL=18% Goal status: INITIAL  2.  The patient will maintain a DHI score below 13 over the next eight to twelve weeks, demonstrating continued low symptom burden and effective management of dizziness related functional limitations. Baseline: EVAL= 14 Goal status: INITIAL  3.  Pt will decrease worst neck pain as reported on NPRS by at least 2 points in order to demonstrate clinically significant reduction in back pain.  Baseline: EVAL= worst = 6/10 Goal status: INITIAL  4.  The patient will demonstrate improved cervical AROM by 10 deg with Left sided SB and  Rotation without symptom provocation to support daily and work related tasks. Baseline: EVAL= See Chart Goal status: INITIAL    PLAN:  PT FREQUENCY: 1-2x/week  PT DURATION: 12 weeks  PLANNED INTERVENTIONS: 97164- PT Re-evaluation, 97750- Physical Performance Testing, 97110-Therapeutic exercises, 97530- Therapeutic activity, [id number]- Neuromuscular re-education, 97535- Self Care, 02859- Manual therapy, [phone]- Canalith repositioning, [id number]- Electrical stimulation (manual), [id number]- Ultrasound, 02987- Traction (mechanical), [phone] (1-2 muscles), 20561 (3+ muscles)- Dry Needling, Patient/Family education, Taping, Joint mobilization, Joint manipulation, Spinal manipulation, Spinal mobilization, Vestibular training, Cryotherapy, and Moist heat  PLAN FOR NEXT SESSION:  Manual therapy to the right cervical paraspinals, upper trapezius, levator scapulae, and occipital region to address soft tissue tightness. Progress cervical mobility work with emphasis on  lateral flexion and rotation control. Reinforce postural training with focus on scapular retraction and maintaining neutral cervical alignment. Initiate or advance deep cervical flexor strengthening as tolerated. Assess response to todays interventions and monitor for any return of dizziness to further differentiate cervical versus vestibular contributors. Review and progress the home exercise program to ensure proper technique and carryover.   Lonni KATHEE Gainer, PT 02/29/2024, 9:40 AM      "

## 2024-03-03 ENCOUNTER — Ambulatory Visit

## 2024-03-07 ENCOUNTER — Ambulatory Visit

## 2024-04-15 ENCOUNTER — Ambulatory Visit

## 2024-05-13 ENCOUNTER — Ambulatory Visit: Admitting: Dermatology

## 2024-11-21 ENCOUNTER — Other Ambulatory Visit

## 2024-11-28 ENCOUNTER — Encounter: Admitting: Family Medicine
